# Patient Record
Sex: Male | Born: 1950 | Race: Black or African American | Hispanic: No | Smoking: Former smoker
Health system: Southern US, Community
[De-identification: ages and names within clinical notes are randomized; demographics above are authoritative.]

## PROBLEM LIST (undated history)

## (undated) DIAGNOSIS — E785 Hyperlipidemia, unspecified: Secondary | ICD-10-CM

## (undated) DIAGNOSIS — I714 Abdominal aortic aneurysm, without rupture, unspecified: Secondary | ICD-10-CM

## (undated) DIAGNOSIS — I639 Cerebral infarction, unspecified: Secondary | ICD-10-CM

## (undated) DIAGNOSIS — N289 Disorder of kidney and ureter, unspecified: Secondary | ICD-10-CM

## (undated) DIAGNOSIS — I10 Essential (primary) hypertension: Secondary | ICD-10-CM

## (undated) DIAGNOSIS — M1990 Unspecified osteoarthritis, unspecified site: Secondary | ICD-10-CM

## (undated) DIAGNOSIS — I719 Aortic aneurysm of unspecified site, without rupture: Secondary | ICD-10-CM

## (undated) DIAGNOSIS — G629 Polyneuropathy, unspecified: Secondary | ICD-10-CM

## (undated) DIAGNOSIS — I712 Thoracic aortic aneurysm, without rupture, unspecified: Secondary | ICD-10-CM

## (undated) DIAGNOSIS — J439 Emphysema, unspecified: Secondary | ICD-10-CM

## (undated) DIAGNOSIS — H269 Unspecified cataract: Secondary | ICD-10-CM

## (undated) DIAGNOSIS — K219 Gastro-esophageal reflux disease without esophagitis: Secondary | ICD-10-CM

## (undated) HISTORY — DX: Hyperlipidemia, unspecified: E78.5

## (undated) HISTORY — DX: Thoracic aortic aneurysm, without rupture: I71.2

## (undated) HISTORY — DX: Gastro-esophageal reflux disease without esophagitis: K21.9

## (undated) HISTORY — DX: Thoracic aortic aneurysm, without rupture, unspecified: I71.20

## (undated) HISTORY — DX: Emphysema, unspecified: J43.9

---
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
## 2002-05-26 ENCOUNTER — Emergency Department (HOSPITAL_COMMUNITY): Admission: EM | Admit: 2002-05-26 | Discharge: 2002-05-26 | Payer: Self-pay | Admitting: Emergency Medicine

## 2003-04-15 ENCOUNTER — Inpatient Hospital Stay (HOSPITAL_COMMUNITY): Admission: EM | Admit: 2003-04-15 | Discharge: 2003-04-15 | Payer: Self-pay | Admitting: Emergency Medicine

## 2003-12-04 HISTORY — PX: HERNIA REPAIR: SHX51

## 2004-12-03 HISTORY — PX: ABDOMINAL AORTIC ANEURYSM REPAIR: SUR1152

## 2006-02-03 ENCOUNTER — Inpatient Hospital Stay (HOSPITAL_COMMUNITY): Admission: EM | Admit: 2006-02-03 | Discharge: 2006-02-13 | Payer: Self-pay | Admitting: Emergency Medicine

## 2006-02-03 ENCOUNTER — Ambulatory Visit: Payer: Self-pay | Admitting: Critical Care Medicine

## 2006-02-03 ENCOUNTER — Ambulatory Visit: Payer: Self-pay | Admitting: Cardiology

## 2006-02-03 ENCOUNTER — Encounter (INDEPENDENT_AMBULATORY_CARE_PROVIDER_SITE_OTHER): Payer: Self-pay | Admitting: *Deleted

## 2006-02-05 ENCOUNTER — Encounter: Payer: Self-pay | Admitting: Cardiology

## 2006-02-08 ENCOUNTER — Ambulatory Visit: Payer: Self-pay | Admitting: Internal Medicine

## 2006-02-19 ENCOUNTER — Ambulatory Visit: Payer: Self-pay | Admitting: Hospitalist

## 2006-05-13 ENCOUNTER — Ambulatory Visit: Payer: Self-pay | Admitting: Internal Medicine

## 2006-05-21 ENCOUNTER — Ambulatory Visit: Payer: Self-pay | Admitting: Internal Medicine

## 2006-11-20 DIAGNOSIS — Z9089 Acquired absence of other organs: Secondary | ICD-10-CM | POA: Insufficient documentation

## 2006-11-20 DIAGNOSIS — I498 Other specified cardiac arrhythmias: Secondary | ICD-10-CM

## 2006-11-20 DIAGNOSIS — I10 Essential (primary) hypertension: Secondary | ICD-10-CM | POA: Insufficient documentation

## 2006-11-20 DIAGNOSIS — M109 Gout, unspecified: Secondary | ICD-10-CM

## 2006-11-20 DIAGNOSIS — Z8679 Personal history of other diseases of the circulatory system: Secondary | ICD-10-CM | POA: Insufficient documentation

## 2006-11-20 DIAGNOSIS — Z9889 Other specified postprocedural states: Secondary | ICD-10-CM

## 2007-09-24 ENCOUNTER — Ambulatory Visit: Payer: Self-pay | Admitting: Vascular Surgery

## 2008-01-31 ENCOUNTER — Inpatient Hospital Stay (HOSPITAL_COMMUNITY): Admission: EM | Admit: 2008-01-31 | Discharge: 2008-02-13 | Payer: Self-pay | Admitting: Emergency Medicine

## 2008-02-02 ENCOUNTER — Encounter (INDEPENDENT_AMBULATORY_CARE_PROVIDER_SITE_OTHER): Payer: Self-pay | Admitting: Internal Medicine

## 2008-02-03 ENCOUNTER — Encounter (INDEPENDENT_AMBULATORY_CARE_PROVIDER_SITE_OTHER): Payer: Self-pay | Admitting: Internal Medicine

## 2008-02-05 ENCOUNTER — Ambulatory Visit: Payer: Self-pay | Admitting: Physical Medicine & Rehabilitation

## 2008-02-16 ENCOUNTER — Ambulatory Visit: Payer: Self-pay | Admitting: Family Medicine

## 2008-02-17 ENCOUNTER — Ambulatory Visit: Payer: Self-pay | Admitting: *Deleted

## 2008-02-22 ENCOUNTER — Inpatient Hospital Stay (HOSPITAL_COMMUNITY): Admission: EM | Admit: 2008-02-22 | Discharge: 2008-02-24 | Payer: Self-pay | Admitting: Emergency Medicine

## 2008-03-04 ENCOUNTER — Ambulatory Visit: Payer: Self-pay | Admitting: Internal Medicine

## 2008-03-04 LAB — CONVERTED CEMR LAB
HDL: 52 mg/dL (ref >39)
Total CHOL/HDL Ratio: 2.9
Triglycerides: 80 mg/dL (ref <150)
VLDL: 16 mg/dL (ref 0–40)

## 2008-03-08 ENCOUNTER — Ambulatory Visit: Payer: Self-pay | Admitting: Internal Medicine

## 2008-03-08 LAB — CONVERTED CEMR LAB
AST: 16 units/L (ref 0–37)
Alkaline Phosphatase: 74 units/L (ref 39–117)
CO2: 20 meq/L (ref 19–32)
Creatinine, Ser: 1.65 mg/dL — ABNORMAL HIGH (ref 0.40–1.50)
Total Bilirubin: 0.3 mg/dL (ref 0.3–1.2)
Total Protein: 8.6 g/dL — ABNORMAL HIGH (ref 6.0–8.3)

## 2008-04-14 ENCOUNTER — Encounter: Admission: RE | Admit: 2008-04-14 | Discharge: 2008-06-09 | Payer: Self-pay | Admitting: Family Medicine

## 2008-04-19 ENCOUNTER — Ambulatory Visit: Payer: Self-pay | Admitting: Internal Medicine

## 2008-05-12 ENCOUNTER — Encounter: Payer: Self-pay | Admitting: Family Medicine

## 2008-06-02 ENCOUNTER — Ambulatory Visit: Payer: Self-pay | Admitting: Internal Medicine

## 2008-06-07 ENCOUNTER — Ambulatory Visit: Payer: Self-pay | Admitting: Internal Medicine

## 2008-06-10 ENCOUNTER — Ambulatory Visit: Payer: Self-pay | Admitting: Internal Medicine

## 2008-06-10 ENCOUNTER — Encounter: Payer: Self-pay | Admitting: Family Medicine

## 2008-06-10 LAB — CONVERTED CEMR LAB
Alkaline Phosphatase: 63 units/L (ref 39–117)
BUN: 23 mg/dL (ref 6–23)
CO2: 20 meq/L (ref 19–32)
Calcium: 9.4 mg/dL (ref 8.4–10.5)
Chloride: 105 meq/L (ref 96–112)
Cholesterol: 121 mg/dL (ref 0–200)
Creatinine, Ser: 1.67 mg/dL — ABNORMAL HIGH (ref 0.40–1.50)
Glucose, Bld: 130 mg/dL — ABNORMAL HIGH (ref 70–99)
LDL Cholesterol: 61 mg/dL (ref 0–99)
Total CHOL/HDL Ratio: 3
VLDL: 19 mg/dL (ref 0–40)

## 2008-07-21 ENCOUNTER — Ambulatory Visit: Payer: Self-pay | Admitting: Internal Medicine

## 2008-10-11 ENCOUNTER — Ambulatory Visit: Payer: Self-pay | Admitting: Internal Medicine

## 2008-10-11 ENCOUNTER — Encounter: Payer: Self-pay | Admitting: Family Medicine

## 2008-10-11 LAB — CONVERTED CEMR LAB
AST: 53 units/L — ABNORMAL HIGH (ref 0–37)
Albumin: 4.7 g/dL (ref 3.5–5.2)
Alkaline Phosphatase: 75 units/L (ref 39–117)
Amphetamine Screen, Ur: NEGATIVE
Creatinine, Ser: 1.47 mg/dL (ref 0.40–1.50)
HDL: 49 mg/dL (ref >39)
LDL Cholesterol: 71 mg/dL (ref 0–99)
Methadone: NEGATIVE
Opiate Screen, Urine: NEGATIVE
Phencyclidine (PCP): NEGATIVE
Potassium: 4.6 meq/L (ref 3.5–5.3)
Sodium: 138 meq/L (ref 135–145)
Total Protein: 8.1 g/dL (ref 6.0–8.3)
Triglycerides: 76 mg/dL (ref <150)

## 2008-10-20 ENCOUNTER — Ambulatory Visit: Payer: Self-pay | Admitting: Internal Medicine

## 2009-02-13 ENCOUNTER — Inpatient Hospital Stay (HOSPITAL_COMMUNITY): Admission: EM | Admit: 2009-02-13 | Discharge: 2009-02-17 | Payer: Self-pay | Admitting: Emergency Medicine

## 2009-03-30 ENCOUNTER — Ambulatory Visit: Payer: Self-pay | Admitting: Internal Medicine

## 2009-04-25 ENCOUNTER — Ambulatory Visit: Payer: Self-pay | Admitting: Family Medicine

## 2009-05-30 ENCOUNTER — Ambulatory Visit: Payer: Self-pay | Admitting: Internal Medicine

## 2009-05-30 ENCOUNTER — Encounter: Payer: Self-pay | Admitting: Family Medicine

## 2009-05-30 LAB — CONVERTED CEMR LAB
ALT: 17 units/L (ref 0–53)
AST: 18 units/L (ref 0–37)
Albumin: 4.4 g/dL (ref 3.5–5.2)
Alkaline Phosphatase: 62 units/L (ref 39–117)
Basophils Relative: 0 % (ref 0–1)
Creatinine, Ser: 1.37 mg/dL (ref 0.40–1.50)
Eosinophils Absolute: 0.2 10*3/uL (ref 0.0–0.7)
Hemoglobin: 13.4 g/dL (ref 13.0–17.0)
LDL Cholesterol: 94 mg/dL (ref 0–99)
Lymphocytes Relative: 24 % (ref 12–46)
Lymphs Abs: 2.5 10*3/uL (ref 0.7–4.0)
Monocytes Absolute: 0.6 10*3/uL (ref 0.1–1.0)
Neutro Abs: 6.9 10*3/uL (ref 1.7–7.7)
Neutrophils Relative %: 67 % (ref 43–77)
RBC: 4.46 M/uL (ref 4.22–5.81)
RDW: 16.4 % — ABNORMAL HIGH (ref 11.5–15.5)
TSH: 1.075 microintl units/mL (ref 0.350–4.500)
Total Bilirubin: 0.3 mg/dL (ref 0.3–1.2)
Total Protein: 7.4 g/dL (ref 6.0–8.3)
Triglycerides: 87 mg/dL (ref <150)

## 2009-06-08 ENCOUNTER — Ambulatory Visit: Payer: Self-pay | Admitting: Internal Medicine

## 2009-08-09 ENCOUNTER — Ambulatory Visit: Payer: Self-pay | Admitting: Internal Medicine

## 2009-09-07 ENCOUNTER — Ambulatory Visit: Payer: Self-pay | Admitting: Internal Medicine

## 2009-11-20 ENCOUNTER — Emergency Department (HOSPITAL_COMMUNITY): Admission: EM | Admit: 2009-11-20 | Discharge: 2009-11-20 | Payer: Self-pay | Admitting: Emergency Medicine

## 2009-11-29 ENCOUNTER — Ambulatory Visit: Payer: Self-pay | Admitting: Family Medicine

## 2010-01-11 ENCOUNTER — Ambulatory Visit: Payer: Self-pay | Admitting: Internal Medicine

## 2010-01-17 ENCOUNTER — Ambulatory Visit: Payer: Self-pay | Admitting: Internal Medicine

## 2010-01-17 LAB — CONVERTED CEMR LAB
AST: 13 units/L (ref 0–37)
CO2: 21 meq/L (ref 19–32)
CRP: 4.6 mg/dL — ABNORMAL HIGH (ref <0.6)
Calcium: 9.3 mg/dL (ref 8.4–10.5)
Chloride: 104 meq/L (ref 96–112)
Glucose, Bld: 153 mg/dL — ABNORMAL HIGH (ref 70–99)
Sodium: 138 meq/L (ref 135–145)
Total CHOL/HDL Ratio: 2.8
Total Protein: 7.6 g/dL (ref 6.0–8.3)
Uric Acid, Serum: 11.2 mg/dL — ABNORMAL HIGH (ref 4.0–7.8)

## 2010-01-18 ENCOUNTER — Ambulatory Visit: Payer: Self-pay | Admitting: Family Medicine

## 2010-02-10 ENCOUNTER — Emergency Department (HOSPITAL_COMMUNITY): Admission: EM | Admit: 2010-02-10 | Discharge: 2010-02-10 | Payer: Self-pay | Admitting: Emergency Medicine

## 2010-02-22 ENCOUNTER — Ambulatory Visit: Payer: Self-pay | Admitting: Internal Medicine

## 2010-02-22 LAB — CONVERTED CEMR LAB
Calcium: 9.7 mg/dL (ref 8.4–10.5)
Chloride: 106 meq/L (ref 96–112)
Creatinine, Ser: 1.59 mg/dL — ABNORMAL HIGH (ref 0.40–1.50)
Glucose, Bld: 94 mg/dL (ref 70–99)

## 2010-03-22 ENCOUNTER — Ambulatory Visit: Payer: Self-pay | Admitting: Internal Medicine

## 2010-05-03 ENCOUNTER — Ambulatory Visit: Payer: Self-pay | Admitting: Internal Medicine

## 2010-05-03 LAB — CONVERTED CEMR LAB
CO2: 21 meq/L (ref 19–32)
Chloride: 110 meq/L (ref 96–112)
Magnesium: 2 mg/dL (ref 1.5–2.5)
Potassium: 4.5 meq/L (ref 3.5–5.3)
Sodium: 144 meq/L (ref 135–145)

## 2010-05-10 ENCOUNTER — Ambulatory Visit: Payer: Self-pay | Admitting: Internal Medicine

## 2010-06-07 ENCOUNTER — Emergency Department (HOSPITAL_COMMUNITY): Admission: EM | Admit: 2010-06-07 | Discharge: 2010-06-07 | Payer: Self-pay | Admitting: Emergency Medicine

## 2010-07-03 ENCOUNTER — Ambulatory Visit: Payer: Self-pay | Admitting: Internal Medicine

## 2010-07-03 LAB — CONVERTED CEMR LAB
BUN: 23 mg/dL (ref 6–23)
CO2: 21 meq/L (ref 19–32)
Chloride: 107 meq/L (ref 96–112)
Creatinine, Ser: 1.31 mg/dL (ref 0.40–1.50)
Potassium: 4.3 meq/L (ref 3.5–5.3)

## 2010-07-17 ENCOUNTER — Ambulatory Visit: Payer: Self-pay | Admitting: Internal Medicine

## 2010-07-17 LAB — CONVERTED CEMR LAB
BUN: 16 mg/dL (ref 6–23)
Calcium: 9.6 mg/dL (ref 8.4–10.5)
Chloride: 107 meq/L (ref 96–112)
Potassium: 4.2 meq/L (ref 3.5–5.3)
Sodium: 141 meq/L (ref 135–145)

## 2011-02-21 ENCOUNTER — Other Ambulatory Visit (HOSPITAL_COMMUNITY): Payer: Self-pay | Admitting: Family Medicine

## 2011-02-21 ENCOUNTER — Ambulatory Visit (HOSPITAL_COMMUNITY)
Admission: RE | Admit: 2011-02-21 | Discharge: 2011-02-21 | Disposition: A | Discharge status: Home / Self Care | Payer: Medicare Other | Source: Ambulatory Visit | Attending: Family Medicine | Admitting: Family Medicine

## 2011-02-21 ENCOUNTER — Inpatient Hospital Stay (HOSPITAL_COMMUNITY)
Admission: RE | Admit: 2011-02-21 | Discharge: 2011-02-21 | Disposition: A | Discharge status: Home / Self Care | Payer: Medicare Other | Source: Ambulatory Visit | Attending: Family Medicine | Admitting: Family Medicine

## 2011-02-21 DIAGNOSIS — R0602 Shortness of breath: Secondary | ICD-10-CM | POA: Insufficient documentation

## 2011-02-21 DIAGNOSIS — Z8673 Personal history of transient ischemic attack (TIA), and cerebral infarction without residual deficits: Secondary | ICD-10-CM | POA: Insufficient documentation

## 2011-02-21 DIAGNOSIS — R52 Pain, unspecified: Secondary | ICD-10-CM

## 2011-02-21 DIAGNOSIS — F17200 Nicotine dependence, unspecified, uncomplicated: Secondary | ICD-10-CM | POA: Insufficient documentation

## 2011-02-21 DIAGNOSIS — R079 Chest pain, unspecified: Secondary | ICD-10-CM | POA: Insufficient documentation

## 2011-02-21 DIAGNOSIS — R059 Cough, unspecified: Secondary | ICD-10-CM | POA: Insufficient documentation

## 2011-02-21 DIAGNOSIS — R05 Cough: Secondary | ICD-10-CM | POA: Insufficient documentation

## 2011-02-25 LAB — BASIC METABOLIC PANEL
CO2: 22 mEq/L (ref 19–32)
Calcium: 9.2 mg/dL (ref 8.4–10.5)
Chloride: 105 mEq/L (ref 96–112)
Creatinine, Ser: 1.42 mg/dL (ref 0.4–1.5)
Glucose, Bld: 191 mg/dL — ABNORMAL HIGH (ref 70–99)
Sodium: 137 mEq/L (ref 135–145)

## 2011-02-25 LAB — ANAEROBIC CULTURE

## 2011-02-25 LAB — BASIC METABOLIC PANEL WITH GFR
BUN: 18 mg/dL (ref 6–23)
GFR calc Af Amer: >60 mL/min (ref >60)
GFR calc non Af Amer: 51 mL/min — ABNORMAL LOW (ref >60)
Potassium: 3.9 meq/L (ref 3.5–5.1)

## 2011-02-25 LAB — CBC
HCT: 44.1 % (ref 39.0–52.0)
Hemoglobin: 14.8 g/dL (ref 13.0–17.0)
MCHC: 33.6 g/dL (ref 30.0–36.0)
MCV: 92.1 fL (ref 78.0–100.0)
Platelets: 172 K/uL (ref 150–400)
RBC: 4.78 MIL/uL (ref 4.22–5.81)
RDW: 16 % — ABNORMAL HIGH (ref 11.5–15.5)
WBC: 14.2 K/uL — ABNORMAL HIGH (ref 4.0–10.5)

## 2011-02-25 LAB — GRAM STAIN

## 2011-02-25 LAB — SEDIMENTATION RATE: Sed Rate: 75 mm/h — ABNORMAL HIGH (ref 0–16)

## 2011-02-25 LAB — DIFFERENTIAL
Basophils Absolute: 0 K/uL (ref 0.0–0.1)
Basophils Relative: 0 % (ref 0–1)
Eosinophils Absolute: 0.1 10*3/uL (ref 0.0–0.7)
Eosinophils Relative: 1 % (ref 0–5)
Lymphocytes Relative: 13 % (ref 12–46)
Lymphs Abs: 1.8 10*3/uL (ref 0.7–4.0)
Monocytes Absolute: 1.6 10*3/uL — ABNORMAL HIGH (ref 0.1–1.0)
Monocytes Relative: 11 % (ref 3–12)
Neutro Abs: 10.6 K/uL — ABNORMAL HIGH (ref 1.7–7.7)
Neutrophils Relative %: 75 % (ref 43–77)

## 2011-02-25 LAB — SYNOVIAL CELL COUNT + DIFF, W/ CRYSTALS
Eosinophils-Synovial: 0 % (ref 0–1)
WBC, Synovial: 22365 /mm3 — ABNORMAL HIGH (ref 0–200)

## 2011-02-25 LAB — BODY FLUID CULTURE: Culture: NO GROWTH

## 2011-02-25 LAB — GLUCOSE, SYNOVIAL FLUID: Glucose, Synovial Fluid: 150 mg/dL

## 2011-02-25 LAB — C-REACTIVE PROTEIN: CRP: 27.2 mg/dL — ABNORMAL HIGH (ref <0.6)

## 2011-02-25 LAB — PROTEIN, BODY FLUID

## 2011-03-02 ENCOUNTER — Other Ambulatory Visit (HOSPITAL_COMMUNITY): Payer: Self-pay | Admitting: Family Medicine

## 2011-03-02 DIAGNOSIS — R05 Cough: Secondary | ICD-10-CM

## 2011-03-02 DIAGNOSIS — R059 Cough, unspecified: Secondary | ICD-10-CM

## 2011-03-02 DIAGNOSIS — M549 Dorsalgia, unspecified: Secondary | ICD-10-CM

## 2011-03-05 LAB — URINALYSIS, ROUTINE W REFLEX MICROSCOPIC
Bilirubin Urine: NEGATIVE
Glucose, UA: NEGATIVE mg/dL
Hgb urine dipstick: NEGATIVE
Ketones, ur: NEGATIVE mg/dL
Leukocytes, UA: NEGATIVE
Protein, ur: 30 mg/dL — AB
pH: 6 (ref 5.0–8.0)

## 2011-03-05 LAB — URINE MICROSCOPIC-ADD ON

## 2011-03-07 ENCOUNTER — Ambulatory Visit (HOSPITAL_COMMUNITY)
Admission: RE | Admit: 2011-03-07 | Discharge: 2011-03-07 | Disposition: A | Discharge status: Home / Self Care | Payer: Medicare Other | Source: Ambulatory Visit | Attending: Family Medicine | Admitting: Family Medicine

## 2011-03-07 ENCOUNTER — Encounter (HOSPITAL_COMMUNITY): Payer: Self-pay

## 2011-03-07 DIAGNOSIS — M549 Dorsalgia, unspecified: Secondary | ICD-10-CM

## 2011-03-07 DIAGNOSIS — I712 Thoracic aortic aneurysm, without rupture, unspecified: Secondary | ICD-10-CM | POA: Insufficient documentation

## 2011-03-07 DIAGNOSIS — J438 Other emphysema: Secondary | ICD-10-CM | POA: Insufficient documentation

## 2011-03-07 DIAGNOSIS — R05 Cough: Secondary | ICD-10-CM

## 2011-03-07 HISTORY — DX: Essential (primary) hypertension: I10

## 2011-03-07 MED ORDER — IOHEXOL 300 MG/ML  SOLN
100.0000 mL | Freq: Once | INTRAMUSCULAR | Status: AC | PRN
  Administered 2011-03-07: 100 mL via INTRAVENOUS

## 2011-03-12 ENCOUNTER — Other Ambulatory Visit (INDEPENDENT_AMBULATORY_CARE_PROVIDER_SITE_OTHER): Payer: Medicare Other | Admitting: Internal Medicine

## 2011-03-12 DIAGNOSIS — E119 Type 2 diabetes mellitus without complications: Secondary | ICD-10-CM

## 2011-03-12 DIAGNOSIS — E669 Obesity, unspecified: Secondary | ICD-10-CM

## 2011-03-12 DIAGNOSIS — E1169 Type 2 diabetes mellitus with other specified complication: Secondary | ICD-10-CM

## 2011-03-15 ENCOUNTER — Other Ambulatory Visit (HOSPITAL_COMMUNITY): Payer: Self-pay | Admitting: Family Medicine

## 2011-03-15 DIAGNOSIS — I712 Thoracic aortic aneurysm, without rupture: Secondary | ICD-10-CM

## 2011-03-15 LAB — GLUCOSE, CAPILLARY
Glucose-Capillary: 109 mg/dL — ABNORMAL HIGH (ref 70–99)
Glucose-Capillary: 113 mg/dL — ABNORMAL HIGH (ref 70–99)
Glucose-Capillary: 124 mg/dL — ABNORMAL HIGH (ref 70–99)
Glucose-Capillary: 199 mg/dL — ABNORMAL HIGH (ref 70–99)
Glucose-Capillary: 201 mg/dL — ABNORMAL HIGH (ref 70–99)
Glucose-Capillary: 209 mg/dL — ABNORMAL HIGH (ref 70–99)
Glucose-Capillary: 254 mg/dL — ABNORMAL HIGH (ref 70–99)
Glucose-Capillary: 270 mg/dL — ABNORMAL HIGH (ref 70–99)
Glucose-Capillary: 271 mg/dL — ABNORMAL HIGH (ref 70–99)
Glucose-Capillary: 277 mg/dL — ABNORMAL HIGH (ref 70–99)
Glucose-Capillary: 125 mg/dL — ABNORMAL HIGH (ref 70–99)
Glucose-Capillary: 125 mg/dL — ABNORMAL HIGH (ref 70–99)
Glucose-Capillary: 149 mg/dL — ABNORMAL HIGH (ref 70–99)
Glucose-Capillary: 163 mg/dL — ABNORMAL HIGH (ref 70–99)
Glucose-Capillary: 166 mg/dL — ABNORMAL HIGH (ref 70–99)
Glucose-Capillary: 204 mg/dL — ABNORMAL HIGH (ref 70–99)
Glucose-Capillary: 281 mg/dL — ABNORMAL HIGH (ref 70–99)
Glucose-Capillary: 438 mg/dL — ABNORMAL HIGH (ref 70–99)
Glucose-Capillary: 552 mg/dL (ref 70–99)
Glucose-Capillary: >600 mg/dL (ref 70–99)
Glucose-Capillary: >600 mg/dL (ref 70–99)

## 2011-03-15 LAB — BASIC METABOLIC PANEL
BUN: 22 mg/dL (ref 6–23)
BUN: 48 mg/dL — ABNORMAL HIGH (ref 6–23)
BUN: 50 mg/dL — ABNORMAL HIGH (ref 6–23)
CO2: 22 mEq/L (ref 19–32)
CO2: 25 mEq/L (ref 19–32)
CO2: 27 mEq/L (ref 19–32)
Calcium: 8.9 mg/dL (ref 8.4–10.5)
Calcium: 10.5 mg/dL (ref 8.4–10.5)
Chloride: 109 mEq/L (ref 96–112)
Chloride: 113 mEq/L — ABNORMAL HIGH (ref 96–112)
Chloride: 118 mEq/L — ABNORMAL HIGH (ref 96–112)
Chloride: 121 mEq/L — ABNORMAL HIGH (ref 96–112)
Creatinine, Ser: 1.52 mg/dL — ABNORMAL HIGH (ref 0.4–1.5)
Creatinine, Ser: 1.96 mg/dL — ABNORMAL HIGH (ref 0.4–1.5)
Creatinine, Ser: 2.56 mg/dL — ABNORMAL HIGH (ref 0.4–1.5)
Creatinine, Ser: 2.7 mg/dL — ABNORMAL HIGH (ref 0.4–1.5)
GFR calc Af Amer: 43 mL/min — ABNORMAL LOW (ref >60)
GFR calc Af Amer: 29 mL/min — ABNORMAL LOW (ref >60)
GFR calc non Af Amer: 47 mL/min — ABNORMAL LOW (ref >60)
GFR calc non Af Amer: 24 mL/min — ABNORMAL LOW (ref >60)
Glucose, Bld: 101 mg/dL — ABNORMAL HIGH (ref 70–99)
Glucose, Bld: 234 mg/dL — ABNORMAL HIGH (ref 70–99)
Glucose, Bld: 161 mg/dL — ABNORMAL HIGH (ref 70–99)
Potassium: 3.5 mEq/L (ref 3.5–5.1)
Potassium: 3.6 mEq/L (ref 3.5–5.1)
Sodium: 141 mEq/L (ref 135–145)
Sodium: 144 mEq/L (ref 135–145)
Sodium: 154 mEq/L — ABNORMAL HIGH (ref 135–145)

## 2011-03-15 LAB — CBC
HCT: 39.6 % (ref 39.0–52.0)
Hemoglobin: 13.6 g/dL (ref 13.0–17.0)
Hemoglobin: 14.6 g/dL (ref 13.0–17.0)
Hemoglobin: 15.9 g/dL (ref 13.0–17.0)
MCHC: 34.4 g/dL (ref 30.0–36.0)
MCHC: 33.4 g/dL (ref 30.0–36.0)
MCV: 90.3 fL (ref 78.0–100.0)
MCV: 91.5 fL (ref 78.0–100.0)
MCV: 92 fL (ref 78.0–100.0)
Platelets: 79 10*3/uL — ABNORMAL LOW (ref 150–400)
Platelets: 132 10*3/uL — ABNORMAL LOW (ref 150–400)
RBC: 4.55 MIL/uL (ref 4.22–5.81)
RDW: 14.7 % (ref 11.5–15.5)
RDW: 15.2 % (ref 11.5–15.5)
RDW: 15.2 % (ref 11.5–15.5)
WBC: 16.3 10*3/uL — ABNORMAL HIGH (ref 4.0–10.5)
WBC: 9.8 10*3/uL (ref 4.0–10.5)

## 2011-03-15 LAB — DIFFERENTIAL
Basophils Absolute: 0 10*3/uL (ref 0.0–0.1)
Basophils Relative: 0 % (ref 0–1)
Eosinophils Absolute: 0.4 10*3/uL (ref 0.0–0.7)
Eosinophils Absolute: 0.6 10*3/uL (ref 0.0–0.7)
Eosinophils Absolute: 0 10*3/uL (ref 0.0–0.7)
Eosinophils Relative: 3 % (ref 0–5)
Lymphs Abs: 5.3 10*3/uL — ABNORMAL HIGH (ref 0.7–4.0)
Lymphs Abs: 1.6 10*3/uL (ref 0.7–4.0)
Monocytes Absolute: 0.8 10*3/uL (ref 0.1–1.0)
Monocytes Absolute: 1.1 10*3/uL — ABNORMAL HIGH (ref 0.1–1.0)
Monocytes Absolute: 0.8 10*3/uL (ref 0.1–1.0)
Monocytes Relative: 7 % (ref 3–12)
Monocytes Relative: 6 % (ref 3–12)
Neutro Abs: 9.3 10*3/uL — ABNORMAL HIGH (ref 1.7–7.7)
Neutrophils Relative %: 57 % (ref 43–77)
Neutrophils Relative %: 81 % — ABNORMAL HIGH (ref 43–77)

## 2011-03-15 LAB — RAPID URINE DRUG SCREEN, HOSP PERFORMED
Cocaine: NOT DETECTED
Tetrahydrocannabinol: NOT DETECTED

## 2011-03-15 LAB — COMPREHENSIVE METABOLIC PANEL
ALT: 30 U/L (ref 0–53)
Albumin: 4.6 g/dL (ref 3.5–5.2)
Alkaline Phosphatase: 113 U/L (ref 39–117)
Calcium: 10.4 mg/dL (ref 8.4–10.5)
GFR calc Af Amer: 25 mL/min — ABNORMAL LOW (ref >60)
Glucose, Bld: 1083 mg/dL (ref 70–99)
Potassium: 6.1 mEq/L — ABNORMAL HIGH (ref 3.5–5.1)
Sodium: 137 mEq/L (ref 135–145)
Total Protein: 9 g/dL — ABNORMAL HIGH (ref 6.0–8.3)

## 2011-03-15 LAB — MAGNESIUM
Magnesium: 2.7 mg/dL — ABNORMAL HIGH (ref 1.5–2.5)
Magnesium: 2.9 mg/dL — ABNORMAL HIGH (ref 1.5–2.5)
Magnesium: 3.5 mg/dL — ABNORMAL HIGH (ref 1.5–2.5)

## 2011-03-15 LAB — URINE MICROSCOPIC-ADD ON

## 2011-03-15 LAB — LIPID PANEL
Triglycerides: 152 mg/dL — ABNORMAL HIGH (ref <150)
VLDL: 30 mg/dL (ref 0–40)

## 2011-03-15 LAB — URINE CULTURE: Colony Count: 50000

## 2011-03-15 LAB — PHOSPHORUS: Phosphorus: 2.9 mg/dL (ref 2.3–4.6)

## 2011-03-15 LAB — URINALYSIS, ROUTINE W REFLEX MICROSCOPIC
Glucose, UA: >1000 mg/dL — AB
Ketones, ur: 15 mg/dL — AB
Leukocytes, UA: NEGATIVE
Nitrite: NEGATIVE
Protein, ur: NEGATIVE mg/dL

## 2011-03-19 ENCOUNTER — Encounter: Payer: Medicare Other | Admitting: Cardiothoracic Surgery

## 2011-03-19 ENCOUNTER — Other Ambulatory Visit (HOSPITAL_COMMUNITY): Payer: Medicare Other

## 2011-03-20 ENCOUNTER — Encounter (INDEPENDENT_AMBULATORY_CARE_PROVIDER_SITE_OTHER): Payer: Medicare Other | Admitting: Cardiothoracic Surgery

## 2011-03-20 DIAGNOSIS — I712 Thoracic aortic aneurysm, without rupture: Secondary | ICD-10-CM

## 2011-03-21 NOTE — Consult Note (Signed)
NEW PATIENT CONSULTATION  [name], [name] DOB:  03/17/1951                                        March 20, 2011 CHART #:  [id number]  REASON FOR CONSULTATION:  A 5.1-cm dilatation of the proximal descending thoracic aorta.  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  The patient has been worked up by Dr. Clelia Croft at [facility].  He initially presented with symptoms of bronchitis and some mid back pain with coughing.  His sputum was slightly dark, but no hemoptysis was noted.  The patient was treated by Dr. Clelia Croft with a course of steroids and oral antibiotics with improved symptoms.  Her exam indicated some tenderness along the paraspinal muscles.  A followup CT scan was performed in late March which showed bullous emphysema and atherosclerotic calcification of the thoracic aorta with a 5.1-cm diameter of the descending thoracic aorta past the left subclavian artery.  The ascending aorta measures 4.0-cm.  Because of the CT scan was done with a rule out pulmonary embolus protocol, it was difficult to say if there is actually a penetrating ulcer in the descending thoracic aorta or just atherosclerotic disease with calcification.  The patient did not have evidence of pulmonary embolus.  Because of the abnormality on CT scan, a thoracic surgical evaluation was requested.  PAST MEDICAL HISTORY: 1. Hypertension. 2. Smoking one pack per day. 3. Diabetes mellitus. 4. Status post left mid brain stroke in 2009, with subsequent diplopia     and gait disorder. 5. 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 Gretta Began. 6. Chronic renal insufficiency with a creatinine baseline of 2.0. 7. Gout.  CURRENT MEDICATIONS: 1. Norvasc 5 mg daily. 2. Lisinopril 10 mg daily. 3. Labetalol 200 mg b.i.d. 4. Aspirin 81 mg daily. 5. Glipizide 10 mg b.i.d.  ALLERGIES:  None.  SOCIAL HISTORY:  The patient is retired from working in [employer].  He lives with his sister [name] and receives health care from [facility].  He gets his medications from the [facility].  He smokes a pack of cigarettes a day and drinks alcohol on the weekends only.  He does not drive.  FAMILY HISTORY:  Positive for diabetes and hypertension.  Negative for aortic aneurysm disease.  REVIEW OF SYSTEMS:  CONSTITUTIONAL:  The patient overall has a fairly sedentary lifestyle.  He denies recent fever or night sweats. ENT REVIEW:  Positive for a partial plate in the mandible, otherwise very few remaining teeth in the mandible.  No teeth in the maxilla.  He denies difficulty swallowing or symptoms of aspiration.  He denies thoracic trauma, rib fracture or pneumothorax. CARDIAC REVIEW:  Negative for coronary artery disease, arrhythmia or murmur.  His last echo was in 2009, which showed normal EF with mild AI and mild dilatation of the aortic root. GI REVIEW:  Positive for some evidence of hematochezia in the past, felt to be due to internal hemorrhoids.  A colonoscopy was recommended, but the patient apparently refused.  Urologic review  is positive for his chronic renal insufficiency and his creatinine in the past was gotten to the size of 3.0. GU REVIEW:  Positive for his abdominal aortic aneurysm performed in 2007 with reimplantation of the inferior mesenteric artery into the graft. NEUROLOGIC REVIEW:  Positive for his stroke with resultant gait disorder and some diplopia. HEMATOLOGIC Review:  Negative for bleeding disorder.  PHYSICAL EXAMINATION:  Vital Signs:  The patient is 6 feet 3 inches,  his weight is stable at approximately 200 pounds, blood pressure 168/100, pulse 70, respirations 20 and saturation 97%.  General:  He is chronically ill and somewhat unkempt in appearance.  He smells strongly of tobacco smoke.  HEENT:  His pupils are reactive.  Dentition poor. Otherwise, normocephalic.  Neck:  Without JVD, mass or bruit. Lymphatics:  No palpable supraclavicular or cervical adenopathy.  Breath sounds are diminished bilaterally without wheeze.  Cardiac:  Regular rhythm without murmur or gallop.  Abdomen:  Soft with a well-healed midline surgical scar.  There is no mass palpable.  Extremities: Positive for clubbing and nicotine staining of the nails.  Peripheral pulses are 2+ in the radial and femoral, nonpalpable in the pedal regions.  He has some swelling of his left great toe and tenderness consistent with gout.  IMPRESSION AND PLAN:  This patient has multiple medical problems as well as probable generalized atherosclerotic vascular disease.  The previous abdominal aortic aneurysm required a graft and reconstruction.  His thoracic aorta has moderate dilatation of the proximal descending portion.  The root appears to be only about 4-cm as of the arch.  I told the patient that I felt the best treatment at this time was close followup of the thoracic aorta with serial scans (without contrast) and smoking cessation.  I recommended adding Zocor to help maintain blood vessel integrity and to continue good blood pressure control.  The location of the thoracic aorta disease at this point will be difficult to treat percutaneously with a stent graft or with an open operation and arch replacement using circulatory arrest.  Both of these would have significant risks, and the risks I believe with surgical therapy at this point will be higher than risks of dissection or rupture.  For that reason, I recommend followup with serial scans, although the patient will return in  approximately 3 months with a CT scan without contrast.  Kerin Perna, M.D. Electronically Signed  PV/MEDQ  D:  03/20/2011  T:  03/21/2011  Job:  [id number]  cc:   Norberto Sorenson, MD

## 2011-04-17 NOTE — Discharge Summary (Signed)
[name], CANEPA           ACCOUNT NO.:  [id number]   MEDICAL RECORD NO.:  [id number]          PATIENT TYPE:  INP   LOCATION:  6733                         FACILITY:  [facility]   PHYSICIAN:  Peggye Pitt, M.D. DATE OF BIRTH:  [birth year]   DATE OF ADMISSION:  02/13/2009  DATE OF DISCHARGE:  02/17/2009                               DISCHARGE SUMMARY   PRIMARY CARE PHYSICIAN:  Sharin Grave, MD   DISCHARGE DIAGNOSES:  1. Hyperosmolar nonketotic coma, resolved.  2. Type 2 diabetes mellitus, new diagnosis.  3. Acute on chronic renal insufficiency.  4. Leukocytosis, resolved.  5. Hypertension.  6. History of cerebrovascular accident.   DISCHARGE MEDICATIONS:  1. Norvasc 5 mg daily.  2. Lisinopril 10 mg daily.  3. Labetalol 200 mg twice daily.  4. Aspirin 81 mg daily.  5. Glipizide 10 mg twice daily.  6. Lantus 35 units subcu at bedtime.   DISPOSITION AND FOLLOWUP:  [name] was discharged home in stable  condition up.  He is instructed to call his primary care physician  within a week to obtain an appointment within the next couple of weeks.  From the time of followup, I would recommended checking a BMET to follow  his renal function and also following his CBG trend to further adjust  his Lantus.   CONSULTATIONS THIS HOSPITALIZATION:  None.   IMAGES AND PROCEDURES:  A chest x-ray on February 15, 2009 showed  cardiomegaly with no evidence for CHF.   HISTORY AND PHYSICAL:  For full details, please refer to dictation by  Dr. Lovell Sheehan on February 13, 2009 but in brief, [name] is a very  pleasant 60 year old African American man with past medical history of  hypertension and a prior CVA who was brought in to the hospital  secondary to increased urination.  While in the ED, his sugar was found  to be greater than 1000 and for that reason he was brought into the  hospital for further evaluation and management.   HOSPITAL COURSE:  1. Hyperosmolar nonketotic coma.   Immediately started on IV fluids and      an IV insulin drip has now been transitioned over to subcutaneous      lantus once daily.  He has also been started on glipizide at      maximum dose 10 mg daily.  Given his creatinine clearance, I am      unable to start metformin at this time given the risk for lactic      acidosis.  He has been instructed on checking his blood sugars at      least 2-3 times a day and bringing his log book into his follow up      with his primary care physician for further adjustment of his      Lantus.  As for his hypertension, his blood pressure has been well      controlled while in the hospital.  Of note, given his new diagnosis      of diabetes, I have discontinue his clonidine and started him on an      ACE inhibitor for renal  protection.  The  ACE inhibitor is      lisinopril 10 mg daily.  2. Acute on chronic renal insufficiency.  Upon admission, his initial      creatinine was 3.15 and it has decreased to 1.521 on day of      discharge.  Acute renal insufficiency was likely secondary to      severe dehydration in lieu of increased blood sugars.  His      creatinine has been stable at the 1.5 range for the past 2 days and      I wonder if he may have some degree of chronic renal insufficiency      secondary to his diabetes and hypertension.  For this reason, I      would recommend a followup visit with PCP for recheck of his renal      function to follow.  3. Leukocytosis, which has resolved to 9.8 on day of discharge.  We      have not found a source of his leukocytosis.  4. Rest of chronic medical issues were not a problem this      hospitalization.   DISCHARGE VITAL SIGNS:  Blood pressure 116/74, heart rate 65,  respirations 16, O2 sats 95% on room air with a temperature of 98.0.   DISCHARGE LABORATORY DATA:  Sodium 139, potassium 3.8, chloride 106,  bicarb 23, BUN 22, creatinine 1.52 with a glucose of 234.  WBCs 9.3,  hemoglobin 13.5, and a  platelet count of 179.      Peggye Pitt, M.D.  Electronically Signed     EH/MEDQ  D:  02/17/2009  T:  02/17/2009  Job:  [id number]   cc:   Sharin Grave, MD

## 2011-04-17 NOTE — H&P (Signed)
NAMEBUEL, MOLDER NO.:  [id number]   MEDICAL RECORD NO.:  [id number]          PATIENT TYPE:  INP   LOCATION:  1826                         FACILITY:  [facility]   PHYSICIAN:  Lonia Blood, M.D.DATE OF BIRTH:  1951/01/06   DATE OF ADMISSION:  02/22/2008  DATE OF DISCHARGE:                              HISTORY & PHYSICAL   PRIMARY CARE PHYSICIAN:  [facility].   CHIEF COMPLAINT:  Bright red blood per rectum.   PRESENT ILLNESS:  [name] is a 60 year old gentleman who  is known to our service.  He was discharged from the hospital in mid  March of 2009 after suffering a left brain stroke resulting in left  ptosis, diplopia, and gait instability due to right lower extremity  weakness.  Aspirin therapy was initiated at that time at 325 mg a day  from a baseline of no aspirin.  The patient has been getting along fine  and was in his usual state of health until this morning.  At  approximately 5 in the morning, he got up to use the bathroom.  He felt  the urge to defecate.  He had a very urgent defecation.  He noted bright  red blood in the toilet and on the toilet paper.  Then he noted an  approximate thumb-size blood clot in his underwear.  This was more  maroon in color.  The patient does specifically state, however, that he  had bright red blood in his the toilet and on the toilet paper.  He  reports that he had a similar episode back in March of 2007 when he had  his AAA repair.  These are the only two episodes of this that he has  experienced himself.  There has been no nausea or vomiting.  There had  been no fevers and chills.  No abdominal pain.  No chest pain or  shortness of breath.  At the present time, the patient feels that he is  neurologically stable.   REVIEW OF SYSTEMS:  A comprehensive review of systems is unremarkable  with the exception of the positive elements noted in the history of  present illness above.   PAST MEDICAL  HISTORY:  1. Hypertension.  2. Left brain CVA with ptosis, diplopia, and gait instability as a      result - March of 2009.  3. Status post abdominal aortic aneurysm repair March in 2007.  4. Tobacco abuse in the amount of 1-1/2 packs per day x30 years.  5. Hyperlipidemia.  6. Impaired glucose tolerance.  7. Status post appendectomy.  8. History of right retinal detachment surgery.  9. History of right inguinal hernia repair per old records.   MEDICATIONS:  1. Norvasc 10 mg daily.  2. Clonidine 0.1 mg p.o. t.i.d.  3. Zocor 40 mg p.o. daily.  4. Labetalol 400 mg p.o. b.i.d.  5. Aspirin 325 mg p.o. daily.   ALLERGIES:  NO KNOWN DRUG ALLERGIES.   FAMILY HISTORY:  Noncontributory.   SOCIAL HISTORY:  The patient occasionally partakes in alcohol.  He  discontinued smoking per his report at his earlier hospitalization this  month.  He lives in [place] along with a brother and a sister.   LABORATORY DATA:  The patient is guaiac-positive with bright red blood.  Hemoglobin is 11.2 and was 13.9 on February 02, 2008, white count is  elevated 11.9, platelet count is normal, MCV is normal.  Coags are  normal.  BMET is unremarkable with the exception of elevated BUN at 35  and elevated creatinine at 1.7 which is decreased from 1.86 on February 13, 2008.  Serum glucose is elevated at 125.  LFTs are normal.  Total bili  0.3, albumin is 3.4.   PHYSICAL EXAMINATION:  VITAL SIGNS:  Temperature 97.2, blood pressure  126/84, heart rate 68, respiratory 20, O2 saturation 96% on room air.  GENERAL:  Well-developed, well-nourished gentleman in no acute  respiratory distress.  HEENT:  Normocephalic, atraumatic.  OC/OP clear.  NECK:  No JVD.  LUNGS:  Clear to auscultation bilaterally without wheezes or rhonchi.  CARDIOVASCULAR:  Regular rate and rhythm without murmur, gallop or rub.  Normal S1 and S2.  ABDOMEN:  Well-healed midline incision consistent with the patient's AAA  repair.  Nontender,  nondistended, soft bowel sounds present.  No  hepatosplenomegaly, no rebound, no ascites.  EXTREMITIES:  Trace bilateral lower extremity edema.  No significant  cyanosis or clubbing.  NEUROLOGIC:  4+/5 strength in the right lower extremity and right upper  extremity with 5/5 strength in the left upper and lower extremities.  Intact sensation to touch throughout.  Alert and oriented.  There is a  mild degree of aphasia appreciable  GU:  The patient does have a small number of perirectal skin tags  suggestive of possible old spontaneously healed external hemorrhoids.  There is some bright red blood staining on the buttocks.   IMPRESSION AND PLAN:  1. Bright red blood per rectum.  [name] is likely suffering      with simple internal hemorrhoid bleeding.  He is, however, 60 and      has never had a routine screening colonoscopy.  He also has a very      good reason to need aspirin therapy with his recent stroke.  I will      admit the patient to the acute units.  We will monitor his CBC on      an every 12 hour basis to assure that his hemoglobin is stable.  We      will gently hydrate him.  Will consult gastroenterology to go ahead      and accomplish a colonoscopy during his hospital stay to rule out      other potential etiologies of lower gastrointestinal bleeding so      that we can feel confident in returning him to his aspirin therapy.      The patient does have an elevated BUN.  I feel this is more likely      related to volume depletion and chronic renal insufficiency and not      an indicator of upper gastrointestinal bleeding.  Nonetheless, if      gastroenterology feels that upper gastroenterologic evaluation is      appropriate too, I feel that this would be reasonable.  2. Recent cerebrovascular accident.  The patient appears to be doing      well from this standpoint.  We will continue physical therapy and      occupational therapy in the hospital once the patient is  cleared      from a gastroenterologic standpoint.  The patient should be able to      return home to his current setting at such time that he stabilized      from a gastroenterologic standpoint.  3. Hypertension.  The patient's blood pressure is currently well-      controlled.  We will continue his outpatient medical regimen and      follow his blood pressure closely.  4. Tobacco abuse.  We will use a nicotine patch as needed.  I have      congratulated the patient on his current abstinence and encourage      him to continue to do so.  5. Hyperlipidemia.  Will continue Zocor, as liver function tests are      normal.  6. Impaired glucose tolerance.  We will follow capillary blood      glucoses closely during hospital stay and provide sliding scale      insulin as needed.      Lonia Blood, M.D.  Electronically Signed     JTM/MEDQ  D:  02/22/2008  T:  02/22/2008  Job:  [id number]

## 2011-04-17 NOTE — Discharge Summary (Signed)
Gene, Lawson NO.:  [id number]   MEDICAL RECORD NO.:  [id number]          PATIENT TYPE:  INP   LOCATION:  3005                         FACILITY:  [facility]   PHYSICIAN:  Hettie Holstein, D.O.    DATE OF BIRTH:  1951/09/03   DATE OF ADMISSION:  01/31/2008  DATE OF DISCHARGE:                               DISCHARGE SUMMARY   DATE OF DISCHARGE:  Currently awaiting care management to assist in  final disposition.   PRIMARY CARE PHYSICIAN:  Drs. Christell Constant and Fields.   FINAL DIAGNOSES:  1. Left midbrain infarct with resultant left-sided ptosis and      diplopia.  2. Gait instability secondary to stroke.  3. Hypertension.  4. History of abdominal aortic aneurysm repair.  5. Impaired fasting glucose.  6. Dyslipidemia.   MEDICATIONS ON DISCHARGE:  1. Norvasc 10 mg daily.  2. Aspirin 325 mg daily (if a prescription coverage source can be      established, Aggrenox would be more optimal).  3. Benazepril was discontinued due to elevation of Creatinine;  he      will need a repeat basic metabolic panel this week through [facility] to assure stability of his BUN/Cr.  4. Hydrochlorothiazide 25 mg daily.  5. Labetalol 400 mg p.o. b.i.d.  6. Zocor 40 mg daily with instructions that he certainly needs to      follow up with his primary care Vaughan Garfinkle to assure his liver      function remains stable while on this medication.   DISPOSITION:  At present [name] is felt to be medically stable for  the discharge.  However, his disposition is currently in question at  this time.  Our therapists have recommended inpatient rehab.  He has  been evaluated by rehabilitation medicine and was felt to be a suitable  candidate though I am not certain of the financial and insurance  restrictions in this regard.  If this is not possible for him, perhaps  optimization of therapies in the home setting and supervision needs to  be assured and arranged.  For this reason I have  requested the  assistance of care management and social service.   HISTORY OF PRESENTING ILLNESS:  For full details, please refer to the  H&P as dictated by Dr. Lovell Sheehan.  However, briefly [name] is a 60-  year-old male who was brought into the emergency department by his  family secondary to symptoms of dizziness, stumbling and double vision  for the past 2 days.  He denied having any nausea, vomiting, shortness  of breath or similar symptoms.  He had reported being off of his  medications for several months because he did not like the way they made  him feel.  In any event, he was found to be severely hypertensive with a  blood pressure 235/110 in the emergency department.  Initial CT scan was  negative with the exception of old lacunar infarcts in the thalamic  regions bilaterally.   HOSPITAL COURSE:  [name] was admitted.  He underwent a stroke  evaluation and was seen  by the stroke service.  Underwent swallow screen  as described above.  MRI revealed the findings as described above.  He  continued to suffer with diplopia and ataxia.  He was evaluated by the  rehab service and it was felt that he would make a good candidate for a  rehab admission.  However, the payer source, I believe, is problematic  at this point.   LABORATORY DATA:  His hemoglobin A1c this admission was 6.4.  Basic  metabolic panel revealed a sodium 135, potassium 4.7, BUN 40, creatinine  2.21 and glucose of 98.  Homocystine was with normal range.  MRI is  described as above.  He had atrophy and moderate to advanced chronic  microvascular disease as well as acute infarct in the left midbrain.  His carotid duplex was performed.  The preliminary report revealed no  evidence a significant ICA stenosis, and this can be further followed up  by his primary care physician in the outpatient setting with reference  to the final report.  His 2-D echocardiogram revealed an ejection  fraction of 70-75%.   There was LV wall thickness increased with severe  concentric hypertrophy consistent with hypertensive heart disease.      Hettie Holstein, D.O.  Electronically Signed     ESS/MEDQ  D:  02/09/2008  T:  02/10/2008  Job:  [id number]

## 2011-04-17 NOTE — H&P (Signed)
[name], [name] NO.:  [id number]   MEDICAL RECORD NO.:  [id number]          PATIENT TYPE:  INP   LOCATION:  2113                         FACILITY:  [facility]   PHYSICIAN:  Della Goo, M.D. DATE OF BIRTH:  [birth year]   DATE OF ADMISSION:  01/31/2008  DATE OF DISCHARGE:                              HISTORY & PHYSICAL   PRIMARY CARE PHYSICIAN:  Unassigned.  Dr. Rich Brave.   CHIEF COMPLAINT:  Dizziness, double vision.   HISTORY OF PRESENT ILLNESS:  This is a 60 year old male who was brought  to the emergency department by his family secondary to symptoms of  dizziness stumbling and double vision for the past 2 days.  The patient  denies having any nausea, vomiting or shortness of breath.  He denies  having any previous similar symptoms to this.  The patient also reports  being off of his medications for several months because he does not like  the way his medications make him feel.  His family filled his medication  one day ago when the symptoms began.  In the emergency department, the  patient was found to be severely hypertensive with a blood pressure  235/110 and a CT scan was also performed which did not reveal acute  findings.  However, the patient is strongly suspected of having a  cerebrovascular accident.  Results of the CT scan performed in the  emergency department did reveal old lacunar infarcts in the thalamic  areas bilaterally.   PAST MEDICAL HISTORY:  1. Uncontrolled hypertension and noncompliance.  2. Abdominal aortic aneurysm status post repair in March 2007.  3. History of coronary artery disease.  4. Right-sided inguinal hernia   PAST SURGICAL HISTORY:  History of an appendectomy.   MEDICATIONS:  1. Amlodipine 10 mg one p.o. daily.  2. Benazepril 40 mg one p.o. daily.  3. Clonidine 0.1 mg, the interval is not known at this time.   ALLERGIES:  NO KNOWN DRUG ALLERGIES.   SOCIAL HISTORY:  The patient is a smoker, smokes one and  half packs per  day for 30 years plus he also drinks alcohol.  He reports drinking  socially 2-3 drinks every other day.   FAMILY HISTORY:  Unknown.   FAMILY HISTORY:  Unknown.   REVIEW OF SYSTEMS:  Pertinents are mentioned above.   PHYSICAL EXAMINATION FINDINGS:  GENERAL:  This is a 60 year old well-  nourished, well-developed male in no visible distress at this time.  VITAL SIGNS:  Temperature 99.7, blood pressure initially 235/110, after  labetalol given, his blood pressure decreased to 208/114, heart rate 87,  respirations 20 and O2 saturations 96-98%.  HEENT:  Examination normocephalic, atraumatic.  Pupils are reactive to  light.  Extraocular muscles reveals a deficit of conjugate eye movement  of the left eye.  There is no facial drooping apparent.  There is no  tongue deviation.  Funduscopic examination of the eyes negative for any  acute findings.  Chronic hypertensive changes present.  Oropharynx is  clear.  NECK:  Supple full range of motion.  No thyromegaly, adenopathy, jugular  venous distention.  CARDIOVASCULAR:  Regular rate  and rhythm.  No murmurs, gallops or rubs.  LUNGS: Clear to auscultation bilaterally.  ABDOMEN: Positive bowel sounds, soft, nontender, nondistended.  EXTREMITIES: Without cyanosis, clubbing or edema.  NEUROLOGIC:  Cranial nerves are intact except for the extraocular  movements mentioned above.  There is no facial drooping.  No tongue  deviation.  Otherwise, cranial nerves are intact.  The patient is able  to move his upper extremities.  There is no pronator drift.  He is also  able to move his lower extremities as well.  There are no focal deficits  otherwise on examination of motor and sensory function.  Cerebellar  function appears intact.  However, gait has not been assessed.   LABORATORY STUDIES:  White blood cell count 9.9, hemoglobin 14.3,  hematocrit 42.4, platelets 162, neutrophils 62%, lymphocytes 29%.  Sodium 138, potassium 3.9,  chloride 108, bicarb 21.4, BUN 20, creatinine  1.8, glucose 114.  Protime 12.7, INR 0.9.  Urinalysis negative except  for trace hemoglobin.  CT scan of the head mentioned above.   ASSESSMENT:  A 60 year old male being admitted with:  1. CVA versus TIA.  2. Hypertensive urgency.  3. Uncontrolled hypertension.  4. Noncompliance.   PLAN:  The patient will be admitted to an ICU area.  Cardiac monitoring  and neurologic checks will be ordered.  The patient has been placed on a  Cardene drip to help lower his blood pressure.  Further workup for a CVA  has been ordered.  An MRI/MRA study of the brain has been ordered along  with carotid ultrasound study as well.  DVT and GI prophylaxis have been  ordered.  The patient's oral medications will be restarted and the  patient has been counseled regarding the need for strict compliance with  his antihypertensive medications, however, the patient has stated that  he does not plan to take his medications secondary to the reasons  mentioned above.      Della Goo, M.D.  Electronically Signed     HJ/MEDQ  D:  02/01/2008  T:  02/02/2008  Job:  [id number]

## 2011-04-17 NOTE — H&P (Signed)
[name], [name] NO.:  [id number]   MEDICAL RECORD NO.:  [id number]          PATIENT TYPE:  INP   LOCATION:  1826                         FACILITY:  [facility]   PHYSICIAN:  Della Goo, M.D. DATE OF BIRTH:  October 01, 1951   DATE OF ADMISSION:  02/13/2009  DATE OF DISCHARGE:                              HISTORY & PHYSICAL   PRIMARY CARE PHYSICIAN:  [name].   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  He was brought to the emergency department secondary  to increased urination, increased thirst which he has been having for  the past day and a half.  Of note, patient has a history of borderline  diabetes which was diagnosed at the time of his CVA in February 2009 and  the family reports that they had been controlling this with diet.  The  patient has not had any fevers, chills, chest pain, congestion,  shortness of breath, nausea, vomiting or diarrheal symptoms.   When patient was evaluated in the emergency department a blood sugar was  performed and was found to be 1083.  The patient was started on the IV  insulin drip and IV fluids and referred for admission.   PAST MEDICAL HISTORY:  As mentioned above history of cerebrovascular  accident, borderline diabetes before this, hypertension.   PAST SURGICAL HISTORY:  None.   MEDICATIONS:  Amlodipine, simvastatin, clonidine, aspirin and labetalol.   ALLERGIES:  No known drug allergies.   SOCIAL HISTORY:  Patient is a smoker.  He smokes 1 pack of cigarettes  daily.  He drinks 1 quart of beer every 3 days.   FAMILY HISTORY:  Noncontributory.   REVIEW OF SYSTEMS:  Pertinents are mentioned above.   PHYSICAL EXAMINATION FINDINGS:  This is a 60 year old debilitated, well-  developed  male in acute distress.  VITAL SIGNS:  Temperature 98.1, blood pressure 178/108, heart rate 84,  respirations 16, O2 saturations 93%.  HEENT:  Normocephalic, atraumatic.  Pupils are reactive to light  bilaterally.  Extraocular movements intact funduscopic benign.  No  scleral icterus.  Oropharynx has a pasty, dry mucosa.  NECK:  Supple, full range of motion.  No thyromegaly, adenopathy,  jugular venous distention.  CARDIOVASCULAR:  Regular rate and rhythm.  No murmurs, gallops, rubs.  LUNGS:  Clear to auscultation bilaterally.  ABDOMEN:  Positive bowel sounds, soft, nontender, nondistended.  EXTREMITIES:  Without cyanosis, clubbing or edema.  NEUROLOGIC:  The patient is alert and oriented.  He has mild expressive  aphasia and mild right-sided hemiparesis.   LABORATORY STUDIES:  White blood cell count 13.1, hemoglobin 15.9,  hematocrit 47.8, MCV 93.3, platelets 132, neutrophils 81% and  lymphocytes 13%.  Sodium 137, potassium 6.1, chloride 100, carbon  dioxide 20, glucose 1083, BUN 53, creatinine 3.15.   ASSESSMENT:  A 60 year old male being admitted with:  1. Diabetic ketoacidosis versus hyperosmolar nonketotic hyperglycemia.  2. Hyperkalemia in  the setting of hyperglycemia.  3. Acute renal failure with chronic renal insufficiency.  4. Hypertension.  5. Hyperlipidemia.  6. Previous cerebrovascular accident.   PLAN:  The patient will be admitted to the intensive care unit.  Patient  has been placed on the IV insulin protocol and IV fluids have been  ordered as well for fluid resuscitation.  Antiemetics have been ordered  as needed and patient's regular medications will be further reviewed.  He will be placed on non-caloric clears while he is on the insulin drip  and be transitioned to sliding scale insulin coverage with Lantus  therapy.  Diabetic teaching has also been ordered for patient's family.  The patient will be placed on DVT and GI prophylaxis.  Further workup  will ensue  pending results of patient's clinical studies.  Electrolytes  will be monitored while patient is on the insulin drip and replaced or  corrected as needed.      Della Goo, M.D.  Electronically Signed     HJ/MEDQ  D:  02/13/2009  T:  02/13/2009  Job:  [id number]

## 2011-04-17 NOTE — Consult Note (Signed)
[name], [name] NO.:  [id number]   MEDICAL RECORD NO.:  [id number]          PATIENT TYPE:  INP   LOCATION:  2113                         FACILITY:  [facility]   PHYSICIAN:  Darnelle Bos, MDDATE OF BIRTH:  24-Sep-1951   DATE OF CONSULTATION:  01/31/2008  DATE OF DISCHARGE:                                 CONSULTATION   REQUESTING PHYSICIAN:  Emergency room physician.   REASON FOR CONSULTATION:  Dizziness and diplopia.   HISTORY AND FINDINGS:  Mr. [name] is a 60 year old, African  American, right handed male who came in with episodes of dizziness and  double vision.  This started Friday morning when he woke up.  He states  that when he went to bed he was fine.  He denies any headache, denies  any weakness or paresthesias.  He says that when he got up, his eyes  would appear crossed and this would make him feel dizzy.  He denies any  vertigo.  If his eyes are closed, he denies any sensation of dizziness.  This has remained the same since the onset.   He denies any trouble with speech, swallowing, or chewing.  He denies  any weakness or paresthesias.  He denies any cramps.  He denies any  chest pain, shortness of breath, or palpitations.  The patient thinks  that he has had recent fevers with cough but has not checked his  temperature.  He denies any chills.  Denies any weight loss.   PAST MEDICAL HISTORY:  1. Right retinal detachment surgery.  2. AAA repair.  3. Hypertension but the patient has been noncompliant with      medications.   PAST SURGICAL HISTORY:  As above.   SOCIAL HISTORY:  The patient smokes 5-6 cigarettes a day and drinks once  a week 1-2 glasses of beer or liquor.  He does not work.   REVIEW OF SYSTEMS:  All systems were reviewed.  Pertinent positives are  mentioned in the history and physical.   MEDICATIONS:  1. Amlodipine 10 mg a day.  2. Benazepril 40 mg a day.  3. Klonopin 0.1 mg a day.   ALLERGIES:  None.   PHYSICAL EXAMINATION:  VITAL SIGNS:  Blood pressure was anywhere between  180/95 to 237/110, pulse rate 72-87, saturation 96% on room air.  Temperature is 99.7.  GENERAL:  [name] is a pleasant gentleman in no acute distress.  HEENT:  Normocephalic atraumatic.  NECK:  Supple.  No carotid bruits.  CARDIOVASCULAR:  Regular rhythm and rate.  LUNGS:  Clear to A&P.  EXTREMITIES:  No cyanosis, clubbing, or edema.  NEUROLOGIC:  The patient had no dysarthria or aphasia.  Cranial nerves  showed ptosis of the left eye with difficulty abduction on up gaze more  than down gaze.  Visual fields were intact to confrontation.  No facial  asymmetry.  Tongue was midline.  Palatal elevation was symmetric.  Motor  evaluation normal power and tone.  Deep tendon reflexes 1 plus and  symmetric.  Plantars are downward.  Sensation was intact to position,  touch, and pinprick.  Vibration showed mildly decreased bilaterally  at  metatarsophalangeal joints.  Normal finger-to-nose testing and knee-heel  testing.  Gait was not evaluated at this time.   The patient's CT head was reviewed.  It showed bilateral old lacunar  infarcts but nothing new.  His labs were reviewed.  WBC 9.9, hemoglobin  14.3, hematocrit 42, platelets 152.  Sodium 138, potassium 3.9, BUN 20,  creatinine 1.8, glucose 114.  Urine unremarkable.   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  It is likely secondary to ischemia involving the vaso nervorum of the  third nerve, unlikely to be compressive as it is pupil-sparing.   RECOMMENDATIONS:  1. Agree with MRI of the brain to look for a parenchymal cause.  2. I agree with a stroke workup as it is a stroke of the smallest      vessels supplying the nerves.  3. Aspirin for secondary stroke prophylaxis.  4. Control risk factors including elevated blood pressure.  5. Evaluate for other risk factors including evaluating hemoglobin      A1c.  He also has mild  vibratory loss, so would recommend      evaluation of B12.  6. Will recommend patching alternately the right and the left eye      alternately to prevent the discomfort and the headache.  7. We will follow up on the MRI and the workup.      Darnelle Bos, MD  Electronically Signed     RB/MEDQ  D:  02/01/2008  T:  02/01/2008  Job:  [id number]

## 2011-04-17 NOTE — Discharge Summary (Signed)
NAMEBADEN, BETSCH           ACCOUNT NO.:  [id number]   MEDICAL RECORD NO.:  [id number]          PATIENT TYPE:  INP   LOCATION:  6740                         FACILITY:  [facility]   PHYSICIAN:  Madaline Savage, MD        DATE OF BIRTH:  [birth year]   DATE OF ADMISSION:  02/22/2008  DATE OF DISCHARGE:  02/24/2008                               DISCHARGE SUMMARY   PRIMARY CARE PHYSICIAN:  [facility].   CONSULTATION IN THE HOSPITAL:  He was seen by Dr. Elnoria Howard from  gastroenterology.   DISCHARGE DIAGNOSES:  1. Bright red blood per rectum.  Hemoglobin stable at this time,      likely a hemorrhoidal bleed.  2. Recent left brain cerebrovascular accident.  3. Hypertension.  4. History of abdominal aortic aneurysm.  5. Hyperlipidemia.   DISCHARGE MEDICATIONS:  1. Aspirin 81 mg daily.  2. Norvasc 10 mg daily.  3. Clonidine 0.1 mg 3 times daily.  4. Zocor 40 mg daily.  5. Labetalol 40 mg twice daily.   HISTORY OF PRESENT ILLNESS:  Full history and physical and review of  systems was already dictated by Dr. Lonia Blood, M.D.on February 22, 2008.  In short, Mr. Saunders is a 60 year old gentleman who was  recently discharged from the hospital after he had a left brain stroke.  He was started on aspirin 325 mg daily.  He came to the ER with  complaints of bright red blood in his stools.  He says he had a similar  episode in the past.  His initial hemoglobin was 11.2, and he was  admitted for further evaluation and management.   PROBLEM LIST:  1. Bright red blood per rectum.  Mr. Haden was admitted and his      hemoglobin was monitored.  His hemoglobin has been stable in the      hospital.  He was hydrated.  His hemoglobin has been 10.3, 10.4,      10.1, and 10 for the last 3 days.  He states he still sees red      color in his tissue paper.  We consulted gastroenterology, Dr. Elnoria Howard      saw the patient, and we were planning to get a colonoscopy on him,      but he refused to  drink the prep for the colonoscopy.  I discussed      it with him, and he does not want to drink it and at this      standpoint of time, he does not want to get the colonoscopy done.      I discussed it with Dr. Elnoria Howard who has cleared him for discharge as      his hemoglobin has been stable.  Dr. Elnoria Howard has also recommended to      restart the aspirin at 81 mg daily.  2. Recent cerebrovascular accident.  We will restart his aspirin at 81      mg daily.  3. Hypertension.  Blood pressure was stable during his hospital stay.   DISPOSITION:  He has now been discharged to home in a stable  condition.   FOLLOWUP:  He was asked to follow up with [facility] in 2 weeks.      Madaline Savage, MD  Electronically Signed     PKN/MEDQ  D:  02/24/2008  T:  02/25/2008  Job:  [id number]

## 2011-04-17 NOTE — Discharge Summary (Signed)
NAMERAWLEY, HARJU NO.:  [id number]   MEDICAL RECORD NO.:  [id number]          PATIENT TYPE:  INP   LOCATION:  3005                         FACILITY:  [facility]   PHYSICIAN:  Eduard Clos, MDDATE OF BIRTH:  [birth year]   DATE OF ADMISSION:  01/31/2008  DATE OF DISCHARGE:  02/13/2008                               DISCHARGE SUMMARY   Please refer to the discharge summary dictated by Dr. Hannah Beat on  February 10, 2008 for the course in the hospital and early procedures done.   Patient's discharge was withheld because of increased creatinine level.  Patient's creatinine is increased up to 2.4, up to which his Benazepril  was discontinued along with hydrochlorothiazide was also discontinued.  Patient was also hydrated with IV fluids.  Patient was started on  clonidine.  His blood pressure was more well controlled.  His creatinine  at the time of discharge has improved up to 1.8.   At this time, patient is stable and will be discharged home with home  health care, OT and PT, with advice to follow with [facility] on February 16, 2008 and check his basic metabolic panel.  The plan was discussed in  detail with this patient and patient's family.   ASSESSMENT:  1. 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  2. Hypertension.  3. Dyslipidemia.  4. Acute-on-chronic renal failure.   MEDICATIONS ON DISCHARGE:  1. Aspirin 325 mg p.o. daily.  2. Norvasc 10 mg p.o. daily.  3. Labetalol 400 mg p.o. twice daily.  4. Clonidine 0.1 mg p.o. 3 times daily.  5. Simvastatin 40 mg p.o. daily.   PLAN:  Patient is to be discharged home with home healthcare, OT and PT.  To follow with primary care or [facility] on February 16, 2008 and  recheck his basic metabolic panel.  Patient is to be on a cardiac  healthy diet.  Patient also has a pre-diabetic range of blood sugar for  which I have advised the patient and the patient's family that he needs  to closely follow on this with his primary care physician.  Patient is  to be on fall precautions.      Eduard Clos, MD  Electronically Signed     ANK/MEDQ  D:  02/13/2008  T:  02/13/2008  Job:  [id number]

## 2011-04-20 NOTE — Discharge Summary (Signed)
[name], [name]           ACCOUNT NO.:  [id number]   MEDICAL RECORD NO.:  [id number]          PATIENT TYPE:  INP   LOCATION:  2036                         FACILITY:  [facility]   PHYSICIAN:  Larina Earthly, M.D.    DATE OF BIRTH:  Sep 09, 1951   DATE OF ADMISSION:  02/03/2006  DATE OF DISCHARGE:                                 DISCHARGE SUMMARY   ADMISSION DIAGNOSIS:  Abdominal pain.   PAST MEDICAL HISTORY AND DISCHARGE DIAGNOSES:  1.  Hypertension.  2.  Right inguinal hernia repair.  3.  Status post appendectomy in the past.  4.  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   ALLERGIES:  No known drug allergies.   BRIEF HISTORY:  The patient is a 60 year old African-American male with a  known history of hypertension, who developed sudden onset of severe  dizziness on his way to church the date of admission.  This was associated  with right lower quadrant abdominal pain as well as back pain.  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  There was no evidence of dissection or leaking.  Dr. Arbie Cookey of  the CVTS service was consulted and he felt the patient should be taken  emergently to the operating room for repair secondary to symptomatic  abdominal aortic aneurysm.   HOSPITAL COURSE:  The patient was admitted via the emergency room and found  to have a 6.7 cm abdominal aortic aneurysm as previously stated.  The  patient was then taken emergently to the OR for resection and grafting of  abdominal aortic aneurysm with placement of 22 x 11 Hemashield aorto-to-  right external and left common iliac artery bypass.  The inferior mesenteric  artery was reimplanted into the left limb of the aortoiliac graft.  The  patient tolerated  the procedure well and was hemodynamically stable  immediately postoperatively.  The patient was transferred from the OR to the  SICU in stable condition.  The patient was extubated without complication  and woke up from the anesthesia neurologically intact.   The patient's primary issue in the postoperative course has been his  hypertension.  This was initially difficult to control.  The  pulmonary/critical care medicine team became involved in the patient's care  and made several recommendations as to IV medications for better control of  the patient's hypertension.  Throughout the postoperative course the drips  were then weaned and p.o. medications were started via recommendations per  the pulmonary/critical care team and then subsequently the medical teaching  service.  His hypertension is currently well-controlled.   The patient's postoperative course from a surgical standpoint has progressed  as expected.  All invasive lines and tubes were discontinued in a routine  manner.  His diet was slowly restarted and he is currently tolerating a  regular diet at this time.  His  bowel function has returned with active  bowel movements and flatus, although they are intermittent.  He still does  not have much of an appetite.  The patient has not been ambulating very much  since transfer to unit 2000.  A physical therapy consult was ordered and  they recommend home health PT at discharge home.  He has also been somewhat  difficult to wean from his oxygen.  His pulmonary toilet is actively being  worked on.  On postoperative day 7 the patient, as previously stated, is  having active bowel movements.  He still has a decreased appetite.  He is  afebrile with stable vital signs and maintaining a normal sinus rhythm.  The  abdomen is soft with normal-pitched bowel sounds and the incision is healing  well.  As long as the patient continues to progress in the current manner,  he should be ready for  discharge within the next one to two days pending  morning round reevaluation.  Home health physical therapy will be  established for the patient prior to discharge.   LABORATORY DATA:  CBC and BMP on February 10, 2006:  White count 12.4,  hemoglobin 10, hematocrit 28.  Sodium 135, potassium 3.8, BUN 20, creatinine  1.6, glucose 137.   CONDITION ON DISCHARGE:  Stable.   INSTRUCTIONS:  1.  Diet:  Low salt and low fat.  2.  Activity:  The patient should refrain from driving for three weeks.  No      lifting of anything more than 10 pounds for three weeks, and he should      continue to increase his activity daily per recommendations by home      health physical therapy.  3.  Wound care:  The patient may shower daily and clean the incisions with      soap and water.  If wound problems arise, the patient should contact the      CVTS office.   DISCHARGE MEDICATIONS:  1.  Tylox one to two q.4-6h. p.r.n. pain.  2.  Nicotine patch 21 mg q.24h.  3.  Multivitamin daily.  4.  Lopressor 75 mg b.i.d.  5.  Lotensin 20 mg daily.  6.  Clonidine 0.1 mg daily.   FOLLOW-UP APPOINTMENTS:  1.  With Dr. Arbie Cookey three weeks after discharge.  CVTS office will contact      the patient with the date and time of that appointment.  2.  With the medical teaching service as an outpatient.  The patient will be      instructed to that appointment per the medical teaching service prior to      discharge.      Gene Leisure, PA      Larina Earthly, M.D.  Electronically Signed    AY/MEDQ  D:  02/10/2006  T:  02/12/2006  Job:  [id number]

## 2011-04-20 NOTE — Op Note (Signed)
NAMEBRACKEN, MOFFA           ACCOUNT NO.:  [id number]   MEDICAL RECORD NO.:  [id number]          PATIENT TYPE:  INP   LOCATION:  2399                         FACILITY:  [facility]   PHYSICIAN:  Larina Earthly, M.D.    DATE OF BIRTH:  1951/06/06   DATE OF PROCEDURE:  02/03/2006  DATE OF DISCHARGE:                                 OPERATIVE REPORT   PREOPERATIVE DIAGNOSIS:  Symptomatic large abdominal aortic aneurysm.   POSTOPERATIVE DIAGNOSIS:  Symptomatic large abdominal aortic aneurysm.   OPERATION PERFORMED:  1.  Resection and graft abdominal aortic aneurysm with placement of a 22 x      11 Hemashield aorto to right external and left common iliac artery      bypass.  2.  Reimplantation of inferior mesenteric artery to left limb of aortoiliac      graft.   SURGEON:  Larina Earthly, M.D.   ASSISTANT:  Jerold Coombe, P.A.-C.   ANESTHESIA:  General endotracheal, Dr. Katrinka Blazing.   COMPLICATIONS:  None.   DISPOSITION:  Admission to critical care.   INDICATIONS FOR PROCEDURE:  The patient is a 60 year old gentleman who  presented to the emergency room with a several day history of increasing  abdominal and lower back pain.  He had CT scan showing a 6.5 mm infrarenal  abdominal aortic aneurysm with a component of suprarenal aneurysm as well.  This was a dumbbell-shaped aneurysm of near-normal caliber below the level  of the renal arteries of __________.  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  On the right the  patient had a large internal iliac artery aneurysm at the bifurcation of the  iliac artery on the right.  The patient was taken immediately to the  operating room for urgent repair.   DESCRIPTION OF PROCEDURE:  The patient was prepped and draped in the usual  sterile fashion on the abdomen and both groins.  Incision was made from the  level of the xiphoid to pubis, carried down through the midline fascia with  electrocautery.  There was no free intraperitoneal blood.  There was no  retroperitoneal staining but there was retroperitoneal edema.  The Omni-  Tract retractor was used for exposure.  The transverse colon and omentum  were reflected superiorly and the small bowel was reflected to the right.  The retroperitoneum was opened and the aorta was mobilized up to the level  of the renal arteries.  The renal vein was also mobilized.  The dissection  was taken down to the bifurcation and extended down to isolate the left  internal and external iliac arteries on the right.  The external iliac  artery was encircled with a vessel loop.  The internal iliac artery was  dissected down to the __________ and was aneurysmal.  The inferior  mesenteric artery was ligated at its take off from the aneurysm.  A  serrefine clamp was placed on this.  The inferior mesenteric artery was  divided.  The patient was given 25 g of mannitol and 8000 units of  intravenous heparin.  After adequate  circulation time, the aorta was  occluded at the level of the renal arteries and just below the level of the  renal arteries with a Zeiger clamp.  The left internal and external iliac  arteries were occluded as were the right internal and external iliac  arteries with __________.  The aneurysm was opened with electrocautery  longitudinally.  Lumbar back bleeders were controlled with 2-0 silk figure-  of-eight sutures.  The aorta was transected below the level of the proximal  clamp.  Dissection extended down through the aneurysm was dissected down and  the common iliac artery was transected just above the bifurcation of the  iliac artery.  The right external iliac artery was transected.  The internal  iliac artery was oversewn with 3-0 Prolene distally into the base of the  aneurysm.  A 22 x 11 Hemashield graft was brought onto the field using  __________ for reinforcement and sewn end-to-end to the aorta with a running  3-0  Prolene suture.  Anastomosis was tested and found to be adequate.  There  was an area of branch that  __________bleeding from the back of the aorta.  This was controlled with a 3-0 Prolene suture.  The limbs of the graft were  then cut to the appropriate length and were sewn end-to-end to the right  external iliac artery with a running 5-0 Prolene suture and to the left  common iliac artery just above the bifurcation of the iliac arteries.  Prior  to completion of each anastomosis, the usual flush maneuvers were  undertaken.  Anastomosis was completed restoring flow to the legs.  Next the  inferior mesenteric artery serrefine clamp was removed and there was poor  backbleeding from this.  For this reason, decision was made to reimplant the  inferior mesenteric artery.  __________approximation of the left limb of the  aortic __________graft.  Partial occlusion clamp was placed on the left limb  of the graft and the __________effect of the graft was removed.  The  inferior mesenteric artery was spatulated and sewn end-to-side to the graft  with a running 6-0 Prolene suture.  At the completion of this anastomosis  excellent Doppler flow was noted through the inferior mesenteric artery.  The patient was given 100 mg of protamine to reverse the heparin.  Wound was  irrigated with saline, hemostasis cautery.  The wall of the aorta was closed  over the graft with a running 2-0 Vicryl suture. Next the retroperitoneum  was closed with a running 2-0 Vicryl suture.  The small bowel was run in its  entirety and found to be without injury and placed in the pelvis.  Transverse colon and omentum were placed over this.  Midline fascia was  closed with #1 PDS suture beginning proximally and distally and tying in the  middle. Skin was closed with skin clips.  Sterile dressing was applied.  The  patient had 2 to 3 + dorsalis pedis pulses bilaterally and was transferred to the intensive care unit in stable  condition.      Larina Earthly, M.D.  Electronically Signed     TFE/MEDQ  D:  02/03/2006  T:  02/04/2006  Job:  [id number]

## 2011-04-20 NOTE — H&P (Signed)
NAMEGADIEL, [name]           ACCOUNT NO.:  [id number]   MEDICAL RECORD NO.:  [id number]          PATIENT TYPE:  INP   LOCATION:  2305                         FACILITY:  [facility]   PHYSICIAN:  Larina Earthly, M.D.    DATE OF BIRTH:  17-Feb-1951   DATE OF ADMISSION:  02/03/2006  DATE OF DISCHARGE:                                HISTORY & PHYSICAL   CHIEF COMPLAINT:  Abdominal pain since this morning.   HISTORY OF PRESENT ILLNESS:  [name] is a 60 year old African-  American male with history of hypertension who developed sudden onset of  severe dizziness on his way to church. This was associated with right lower  quadrant abdominal pain and back pain. He also had a known right inguinal  hernia but this had never been painful. He then presented to the [facility] emergency department and underwent an abdominal CT scan which  revealed a 6.7 cm juxtarenal abdominal aortic aneurysm extending into the  iliacs. There was no evidence of dissection or leaking. Vascular surgeon,  Dr. Gretta Began, was consulted and felt the patient should be taken  emergently to the operating room for repair of his symptomatic abdominal  aortic aneurysm. After discussing risks/benefits the patient agreed to  proceed.   PAST MEDICAL HISTORY:  1.  Hypertension.  2.  Right inguinal hernia.  3.  No known history of coronary artery disease.   PAST SURGICAL HISTORY:  Remote history of appendectomy.   MEDICATIONS:  None. There is a question that he was taking  hydrochlorothiazide at some time for his hypertension but denied medications  on admission.   ALLERGIES:  No known drug allergies.   SOCIAL HISTORY:  Unknown.   FAMILY HISTORY:  Unknown.   REVIEW OF SYSTEMS:  Per emergency department record, this was negative other  than positive headache on admission and abdominal pain as mentioned above.   PHYSICAL EXAMINATION:  Findings upon Dr. Bosie Helper assessment as well as ED  record.  VITAL  SIGNS IN THE EMERGENCY DEPARTMENT:  Showed an elevated blood pressure  of 238/122, heart rate of 65, respirations 22, pulse oximetry 98% on room  air.  GENERAL:  Initial exam showed he was alert and cooperative and in no acute  distress with Glasgow coma scale of 15.  HEENT:  Oropharynx grossly normal.  NECK:  Grossly normal.  RESPIRATORY:  Lung sounds clear.  CARDIAC:  Heart had a regular rate and rhythm.  ABDOMEN:  Pulsatile mass was palpated in the right lower quadrant. There is  positive associated tenderness with guarding. Otherwise, abdomen was soft  with positive bowel sounds.  EXTREMITIES:  Extremities showed no edema. He had 3+ radial, femoral,  popliteal, and dorsalis pedis pulses bilaterally.  NEUROLOGIC:  Grossly intact. Moves all extremities.   ASSESSMENT:  1.  Symptomatic juxtarenal abdominal aortic aneurysm measuring 6.7 cm by CT      scan.  2.  Hypertension, uncontrolled.   PLAN:  [name] will be taken emergently to the operating room to  undergo repair of his symptomatic abdominal aortic aneurysm. He was given  nitroglycerin in the emergency  department for his severe hypertension.      Jerold Coombe, P.A.      Larina Earthly, M.D.  Electronically Signed    AWZ/MEDQ  D:  02/03/2006  T:  02/04/2006  Job:  [id number]

## 2011-04-20 NOTE — H&P (Signed)
[name], COZBY NO.:  [id number]   MEDICAL RECORD NO.:  [id number]          PATIENT TYPE:  INP   LOCATION:  2399                         FACILITY:  [facility]   PHYSICIAN:  Jerold Coombe, P.A.DATE OF BIRTH:  Apr 14, 1951   DATE OF ADMISSION:  02/03/2006  DATE OF DISCHARGE:                                HISTORY & PHYSICAL   Audio too short to transcribe (less than 5 seconds)      Jerold Coombe, P.A.     AWZ/MEDQ  D:  02/03/2006  T:  02/03/2006  Job:  [id number]

## 2011-06-14 ENCOUNTER — Other Ambulatory Visit: Payer: Self-pay | Admitting: Cardiothoracic Surgery

## 2011-06-14 DIAGNOSIS — I712 Thoracic aortic aneurysm, without rupture: Secondary | ICD-10-CM

## 2011-07-25 ENCOUNTER — Other Ambulatory Visit: Payer: Medicare Other

## 2011-07-25 ENCOUNTER — Encounter: Payer: Medicare Other | Admitting: Cardiothoracic Surgery

## 2011-08-08 ENCOUNTER — Encounter: Payer: Self-pay | Admitting: Cardiothoracic Surgery

## 2011-08-08 ENCOUNTER — Ambulatory Visit (INDEPENDENT_AMBULATORY_CARE_PROVIDER_SITE_OTHER): Payer: Medicare Other | Admitting: Cardiothoracic Surgery

## 2011-08-08 ENCOUNTER — Ambulatory Visit
Admission: RE | Admit: 2011-08-08 | Discharge: 2011-08-08 | Disposition: A | Discharge status: Home / Self Care | Payer: Medicare Other | Source: Ambulatory Visit | Attending: Cardiothoracic Surgery | Admitting: Cardiothoracic Surgery

## 2011-08-08 VITALS — BP 175/99 | HR 76 | Resp 18 | Ht 75.0 in | Wt 176.0 lb

## 2011-08-08 DIAGNOSIS — J438 Other emphysema: Secondary | ICD-10-CM

## 2011-08-08 DIAGNOSIS — I712 Thoracic aortic aneurysm, without rupture, unspecified: Secondary | ICD-10-CM

## 2011-08-08 DIAGNOSIS — J439 Emphysema, unspecified: Secondary | ICD-10-CM

## 2011-08-08 DIAGNOSIS — I10 Essential (primary) hypertension: Secondary | ICD-10-CM

## 2011-08-08 DIAGNOSIS — Z9889 Other specified postprocedural states: Secondary | ICD-10-CM

## 2011-08-08 NOTE — Patient Instructions (Signed)
Stop smoking,control blood pressure, return in a year for follow-up of aortic aneurysm in your chest

## 2011-08-08 NOTE — Progress Notes (Signed)
HPI the patient is a chronically ill 60 year old hypertensive smoker who returns for a 6 month followup CT scan of a distal arch aneurysm measuring approximately 5 cm. It is asymptomatic. He has fairly significant hypertension followed [facility] is on multiple oral medications. Partially smokes one to 2 packs of cigarettes a day. 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    Current Outpatient Prescriptions  Medication Sig Dispense Refill  . allopurinol (ZYLOPRIM) 100 MG tablet Take 100 mg by mouth daily.        . cloNIDine (CATAPRES) 0.2 MG tablet Take 0.2 mg by mouth 2 (two) times daily.        Marland Kitchen diltiazem (TIAZAC) 120 MG 24 hr capsule Take 120 mg by mouth daily.        Marland Kitchen glipiZIDE (GLUCOTROL) 10 MG tablet Take 10 mg by mouth 2 (two) times daily before a meal.        . labetalol (NORMODYNE) 200 MG tablet Take 200 mg by mouth 2 (two) times daily.        Marland Kitchen lisinopril (PRINIVIL,ZESTRIL) 20 MG tablet Take 20 mg by mouth daily.        . salsalate (DISALCID) 500 MG tablet Take 500 mg by mouth 2 (two) times daily.        . simvastatin (ZOCOR) 20 MG tablet Take 20 mg by mouth at bedtime.        . metFORMIN (GLUCOPHAGE) 500 MG tablet TAKE ONE TABLET BY MOUTH TWICE DAILY  60 tablet  11     Review of Systems: No weight loss or fever. No chest pain. Lower back pain from arthritis present.   Physical Exam Blood pressure 170/95 pulse 76 saturation 92% on room air. General appearance is that of a chronically ill lady black male who has had a stroke. Neck exam is with palpable carotid pulses no JVD present. No bruits are detected. Chest exam is without deformity breath sounds are distant but equal. Cardiac exam regular rhythm without murmur or gallop. Abdomen soft well-healed midline incision from the AAA operation. Nonpalpable pedal pulses.   Diagnostic Tests: CT of the chest with IV contrast shows no  aneurysmal disease of the aortic arch. The descending aorta measures 4.1 cm. The large measures 4.2 cm. The distal arch S4 subclavian measures 4.9-5.0 cm with calcification. No penetrating ulcer or flap. The the descending aorta measures 4.0 cm. Severe bullous emphysema of the apices bilaterally. No discrete pulmonary masses noted.   Impression: Stable atherosclerotic disease of the aortic arch with moderate aneurysmal disease the distal arch-proximal descending thoracic aorta. As been stable since the previous CT scan 6 months previously.   Plan: Continued medical management of hypertension smoking and dyslipidemia. Areolar CT scan of the thoracic aorta one year.

## 2011-08-24 LAB — I-STAT 8, (EC8 V) (CONVERTED LAB)
Acid-base deficit: 3 — ABNORMAL HIGH
BUN: 20
Bicarbonate: 21.4
Chloride: 108
Glucose, Bld: 114 — ABNORMAL HIGH
HCT: 45
Hemoglobin: 15.3
Operator id: 151321
Potassium: 3.9
Sodium: 138
TCO2: 22
pCO2, Ven: 36.1 — ABNORMAL LOW
pH, Ven: 7.381 — ABNORMAL HIGH

## 2011-08-24 LAB — CK TOTAL AND CKMB (NOT AT ARMC)
CK, MB: 4.2 — ABNORMAL HIGH
Relative Index: 3.9 — ABNORMAL HIGH
Total CK: 109

## 2011-08-24 LAB — CBC
HCT: 42.4
Hemoglobin: 14.3
MCV: 88.2
WBC: 9.9

## 2011-08-24 LAB — URINE MICROSCOPIC-ADD ON

## 2011-08-24 LAB — DIFFERENTIAL
Eosinophils Relative: 2
Lymphocytes Relative: 29
Lymphs Abs: 2.9
Monocytes Absolute: 0.6

## 2011-08-24 LAB — TROPONIN I: Troponin I: 0.03

## 2011-08-24 LAB — POCT I-STAT CREATININE
Creatinine, Ser: 1.8 — ABNORMAL HIGH
Operator id: 151321

## 2011-08-24 LAB — URINALYSIS, ROUTINE W REFLEX MICROSCOPIC
Bilirubin Urine: NEGATIVE
Ketones, ur: NEGATIVE
Specific Gravity, Urine: 1.009
Urobilinogen, UA: 0.2

## 2011-08-24 LAB — PROTIME-INR
INR: 0.9
Prothrombin Time: 12.7

## 2011-08-27 LAB — BASIC METABOLIC PANEL
BUN: 36 — ABNORMAL HIGH
BUN: 40 — ABNORMAL HIGH
BUN: 47 — ABNORMAL HIGH
BUN: 47 — ABNORMAL HIGH
CO2: 23
CO2: 23
Calcium: 9.3
Calcium: 9.6
Calcium: 9.8
Chloride: 104
Chloride: 108
Chloride: 110
Creatinine, Ser: 1.86 — ABNORMAL HIGH
Creatinine, Ser: 2.21 — ABNORMAL HIGH
Creatinine, Ser: 2.24 — ABNORMAL HIGH
Creatinine, Ser: 2.44 — ABNORMAL HIGH
GFR calc Af Amer: 33 — ABNORMAL LOW
GFR calc Af Amer: 37 — ABNORMAL LOW
GFR calc Af Amer: 45 — ABNORMAL LOW
GFR calc Af Amer: >60
GFR calc non Af Amer: 30 — ABNORMAL LOW
GFR calc non Af Amer: 30 — ABNORMAL LOW
GFR calc non Af Amer: 31 — ABNORMAL LOW
GFR calc non Af Amer: 38 — ABNORMAL LOW
Glucose, Bld: 102 — ABNORMAL HIGH
Glucose, Bld: 103 — ABNORMAL HIGH
Glucose, Bld: 98
Glucose, Bld: 101 — ABNORMAL HIGH
Potassium: 5
Sodium: 135
Sodium: 137
Sodium: 138

## 2011-08-27 LAB — CBC
HCT: 41.5
HCT: 34 — ABNORMAL LOW
Hemoglobin: 10 — ABNORMAL LOW
Hemoglobin: 10.1 — ABNORMAL LOW
Hemoglobin: 11.4 — ABNORMAL LOW
MCHC: 33.6
MCHC: 33.6
MCHC: 33.7
MCHC: 34.1
MCV: 88.1
MCV: 88
MCV: 89.1
MCV: 89.8
Platelets: 159
Platelets: 164
RBC: 4.71
RBC: 3.36 — ABNORMAL LOW
RBC: 3.38 — ABNORMAL LOW
RBC: 3.44 — ABNORMAL LOW
RBC: 3.47 — ABNORMAL LOW
RBC: 3.79 — ABNORMAL LOW
RDW: 14.9
RDW: 15.3
RDW: 15.4
RDW: 15.6 — ABNORMAL HIGH
WBC: 12 — ABNORMAL HIGH
WBC: 11.9 — ABNORMAL HIGH
WBC: 8.7
WBC: 9.5

## 2011-08-27 LAB — COMPREHENSIVE METABOLIC PANEL
ALT: 47
AST: 16
AST: 36
Albumin: 3.4 — ABNORMAL LOW
Alkaline Phosphatase: 61
Alkaline Phosphatase: 56
BUN: 12
CO2: 23
Chloride: 110
Chloride: 109
Creatinine, Ser: 1.29
GFR calc Af Amer: >60
GFR calc Af Amer: 51 — ABNORMAL LOW
GFR calc non Af Amer: 57 — ABNORMAL LOW
Potassium: 4.3
Potassium: 5.7 — ABNORMAL HIGH
Sodium: 137
Total Bilirubin: 0.9
Total Bilirubin: 0.3
Total Protein: 7.3

## 2011-08-27 LAB — LIPID PANEL
LDL Cholesterol: 122 — ABNORMAL HIGH
VLDL: 19

## 2011-08-27 LAB — BLOOD GAS, ARTERIAL
Drawn by: 284591
FIO2: 100
O2 Saturation: 99.5
Patient temperature: 98.6
pH, Arterial: 7.394

## 2011-08-27 LAB — HEMOGLOBIN A1C
Hgb A1c MFr Bld: 6.4 — ABNORMAL HIGH
Hgb A1c MFr Bld: 6.3 — ABNORMAL HIGH
Mean Plasma Glucose: 151
Mean Plasma Glucose: 147

## 2011-08-27 LAB — URINALYSIS, ROUTINE W REFLEX MICROSCOPIC
Bilirubin Urine: NEGATIVE
Hgb urine dipstick: NEGATIVE
Ketones, ur: NEGATIVE
Nitrite: NEGATIVE
Urobilinogen, UA: 0.2

## 2011-08-27 LAB — DIFFERENTIAL
Basophils Relative: 1
Eosinophils Relative: 1
Monocytes Absolute: 0.8
Monocytes Relative: 7
Neutro Abs: 8.7 — ABNORMAL HIGH

## 2011-08-27 LAB — OCCULT BLOOD X 1 CARD TO LAB, STOOL: Fecal Occult Bld: POSITIVE

## 2011-08-27 LAB — APTT: aPTT: 36

## 2011-08-27 LAB — DRUGS OF ABUSE SCREEN W/O ALC, ROUTINE URINE
Amphetamine Screen, Ur: NEGATIVE
Barbiturate Quant, Ur: NEGATIVE
Marijuana Metabolite: NEGATIVE
Phencyclidine (PCP): NEGATIVE
Propoxyphene: NEGATIVE

## 2011-08-27 LAB — CK TOTAL AND CKMB (NOT AT ARMC)
CK, MB: 5.5 — ABNORMAL HIGH
Total CK: 113

## 2011-08-27 LAB — PROTIME-INR
INR: 1
Prothrombin Time: 13.2

## 2011-12-10 ENCOUNTER — Inpatient Hospital Stay (HOSPITAL_COMMUNITY)
Admission: EM | Admit: 2011-12-10 | Discharge: 2011-12-29 | DRG: 637 | Disposition: A | Discharge status: Home Health | Payer: Medicare Other | Source: Ambulatory Visit | Attending: Internal Medicine | Admitting: Internal Medicine

## 2011-12-10 ENCOUNTER — Other Ambulatory Visit: Payer: Self-pay

## 2011-12-10 ENCOUNTER — Encounter (HOSPITAL_COMMUNITY): Payer: Self-pay | Admitting: *Deleted

## 2011-12-10 ENCOUNTER — Emergency Department (HOSPITAL_COMMUNITY): Payer: Medicare Other

## 2011-12-10 DIAGNOSIS — K565 Intestinal adhesions [bands], unspecified as to partial versus complete obstruction: Secondary | ICD-10-CM | POA: Diagnosis present

## 2011-12-10 DIAGNOSIS — M109 Gout, unspecified: Secondary | ICD-10-CM | POA: Diagnosis present

## 2011-12-10 DIAGNOSIS — Z809 Family history of malignant neoplasm, unspecified: Secondary | ICD-10-CM

## 2011-12-10 DIAGNOSIS — N269 Renal sclerosis, unspecified: Secondary | ICD-10-CM | POA: Diagnosis present

## 2011-12-10 DIAGNOSIS — E1110 Type 2 diabetes mellitus with ketoacidosis without coma: Secondary | ICD-10-CM

## 2011-12-10 DIAGNOSIS — H539 Unspecified visual disturbance: Secondary | ICD-10-CM

## 2011-12-10 DIAGNOSIS — X58XXXA Exposure to other specified factors, initial encounter: Secondary | ICD-10-CM | POA: Diagnosis not present

## 2011-12-10 DIAGNOSIS — Z823 Family history of stroke: Secondary | ICD-10-CM

## 2011-12-10 DIAGNOSIS — R1312 Dysphagia, oropharyngeal phase: Secondary | ICD-10-CM | POA: Diagnosis present

## 2011-12-10 DIAGNOSIS — F17200 Nicotine dependence, unspecified, uncomplicated: Secondary | ICD-10-CM | POA: Diagnosis present

## 2011-12-10 DIAGNOSIS — R112 Nausea with vomiting, unspecified: Secondary | ICD-10-CM | POA: Diagnosis not present

## 2011-12-10 DIAGNOSIS — S3720XA Unspecified injury of bladder, initial encounter: Secondary | ICD-10-CM | POA: Diagnosis not present

## 2011-12-10 DIAGNOSIS — Z9089 Acquired absence of other organs: Secondary | ICD-10-CM

## 2011-12-10 DIAGNOSIS — I959 Hypotension, unspecified: Secondary | ICD-10-CM | POA: Diagnosis not present

## 2011-12-10 DIAGNOSIS — I10 Essential (primary) hypertension: Secondary | ICD-10-CM | POA: Diagnosis present

## 2011-12-10 DIAGNOSIS — Z79899 Other long term (current) drug therapy: Secondary | ICD-10-CM

## 2011-12-10 DIAGNOSIS — N179 Acute kidney failure, unspecified: Secondary | ICD-10-CM | POA: Diagnosis present

## 2011-12-10 DIAGNOSIS — E876 Hypokalemia: Secondary | ICD-10-CM | POA: Diagnosis not present

## 2011-12-10 DIAGNOSIS — R319 Hematuria, unspecified: Secondary | ICD-10-CM | POA: Diagnosis not present

## 2011-12-10 DIAGNOSIS — Z9889 Other specified postprocedural states: Secondary | ICD-10-CM

## 2011-12-10 DIAGNOSIS — E785 Hyperlipidemia, unspecified: Secondary | ICD-10-CM | POA: Diagnosis present

## 2011-12-10 DIAGNOSIS — J189 Pneumonia, unspecified organism: Secondary | ICD-10-CM | POA: Diagnosis not present

## 2011-12-10 DIAGNOSIS — B370 Candidal stomatitis: Secondary | ICD-10-CM | POA: Diagnosis not present

## 2011-12-10 DIAGNOSIS — Z7982 Long term (current) use of aspirin: Secondary | ICD-10-CM

## 2011-12-10 DIAGNOSIS — K56609 Unspecified intestinal obstruction, unspecified as to partial versus complete obstruction: Secondary | ICD-10-CM

## 2011-12-10 DIAGNOSIS — Z794 Long term (current) use of insulin: Secondary | ICD-10-CM

## 2011-12-10 DIAGNOSIS — H532 Diplopia: Secondary | ICD-10-CM | POA: Diagnosis present

## 2011-12-10 DIAGNOSIS — I69998 Other sequelae following unspecified cerebrovascular disease: Secondary | ICD-10-CM

## 2011-12-10 DIAGNOSIS — N189 Chronic kidney disease, unspecified: Secondary | ICD-10-CM | POA: Diagnosis present

## 2011-12-10 DIAGNOSIS — S3730XA Unspecified injury of urethra, initial encounter: Secondary | ICD-10-CM | POA: Diagnosis not present

## 2011-12-10 DIAGNOSIS — R0902 Hypoxemia: Secondary | ICD-10-CM | POA: Diagnosis not present

## 2011-12-10 DIAGNOSIS — E871 Hypo-osmolality and hyponatremia: Secondary | ICD-10-CM | POA: Diagnosis present

## 2011-12-10 DIAGNOSIS — Z8249 Family history of ischemic heart disease and other diseases of the circulatory system: Secondary | ICD-10-CM

## 2011-12-10 DIAGNOSIS — I69993 Ataxia following unspecified cerebrovascular disease: Secondary | ICD-10-CM

## 2011-12-10 DIAGNOSIS — E1310 Other specified diabetes mellitus with ketoacidosis without coma: Principal | ICD-10-CM | POA: Diagnosis present

## 2011-12-10 DIAGNOSIS — Z833 Family history of diabetes mellitus: Secondary | ICD-10-CM

## 2011-12-10 DIAGNOSIS — I129 Hypertensive chronic kidney disease with stage 1 through stage 4 chronic kidney disease, or unspecified chronic kidney disease: Secondary | ICD-10-CM | POA: Diagnosis present

## 2011-12-10 HISTORY — DX: Abdominal aortic aneurysm, without rupture: I71.4

## 2011-12-10 HISTORY — DX: Unspecified cataract: H26.9

## 2011-12-10 HISTORY — DX: Disorder of kidney and ureter, unspecified: N28.9

## 2011-12-10 HISTORY — DX: Aortic aneurysm of unspecified site, without rupture: I71.9

## 2011-12-10 HISTORY — DX: Cerebral infarction, unspecified: I63.9

## 2011-12-10 HISTORY — DX: Abdominal aortic aneurysm, without rupture, unspecified: I71.40

## 2011-12-10 LAB — POCT I-STAT, CHEM 8
Calcium, Ion: 1.21 mmol/L (ref 1.12–1.32)
HCT: 47 % (ref 39.0–52.0)
TCO2: 20 mmol/L (ref 0–100)

## 2011-12-10 MED ORDER — SODIUM CHLORIDE 0.9 % IV BOLUS (SEPSIS)
1000.0000 mL | Freq: Once | INTRAVENOUS | Status: AC
  Administered 2011-12-11: 1000 mL via INTRAVENOUS

## 2011-12-10 NOTE — ED Notes (Signed)
C/o high cbg, not registering it is too high, also frequent urination, stays thirsty & difficulty swallowing foods, "throat swelling". Mentions chest aneurysm being monitored. Pt of [facility] Dr. Clelia Croft. Denies pain. Alert, interactive, skin cool & dry, NAD. calm.

## 2011-12-10 NOTE — ED Notes (Signed)
Pt unable to void at this time urinal at bedside 

## 2011-12-10 NOTE — ED Provider Notes (Addendum)
History     CSN: [id number]  Arrival date & time 12/10/11  2201   First MD Initiated Contact with Patient 12/10/11 2318      Chief Complaint  Patient presents with  . Hyperglycemia    (Consider location/radiation/quality/duration/timing/severity/associated sxs/prior treatment) HPI Comments: 61 year old male with a history of hypertension, diabetes, abdominal aortic aneurysm status post repair, thoracic aneurysm, and history of stroke who presents with a complaint of high blood sugar. According to his sister he has had elevated blood sugars for the last 12 days, this is gradually getting worse in the last 2 days it has registered as too high to register. He admits to having ongoing thirst, urinary frequency, dry mouth but no chest pain no abdominal pain no nausea or vomiting and no fevers or chills. He denies rashes, diarrhea, swelling, headaches, change in vision. The family members deny any missed medications.  Currently he is taking glipizide for his diabetes.  Symptoms are gradually getting worse, severe gradual onset  The history is provided by the patient, medical records and a relative.    Past Medical History  Diagnosis Date  . Diabetes mellitus   . Hypertension   . Aneurysm of aorta   . Aneurysm of abdominal aorta   . Stroke     CVA effected  eye  & leg  . Cataract, bilateral   . Renal disorder     History reviewed. No pertinent past surgical history.  Family History  Problem Relation Age of Onset  . Cancer Mother   . Diabetes Mother   . Heart failure Father   . Stroke Father     History  Substance Use Topics  . Smoking status: Current Everyday Smoker -- 1.0 packs/day  . Smokeless tobacco: Not on file  . Alcohol Use: Yes     little bit      Review of Systems  All other systems reviewed and are negative.    Allergies  Review of patient's allergies indicates no known allergies.  Home Medications   Current Outpatient Rx  Name Route Sig Dispense  Refill  . ALLOPURINOL 100 MG PO TABS Oral Take 100 mg by mouth daily.      . ASPIRIN EC 81 MG PO TBEC Oral Take 81 mg by mouth every morning.      Marland Kitchen CLONIDINE HCL 0.2 MG PO TABS Oral Take 0.2 mg by mouth 3 (three) times daily.     Marland Kitchen DILTIAZEM HCL ER BEADS 360 MG PO CP24 Oral Take 360 mg by mouth daily.      Marland Kitchen GLIPIZIDE 10 MG PO TABS Oral Take 10 mg by mouth 2 (two) times daily before a meal.      . LABETALOL HCL 100 MG PO TABS Oral Take 100 mg by mouth 2 (two) times daily.      Marland Kitchen LISINOPRIL 20 MG PO TABS Oral Take 20 mg by mouth 2 (two) times daily.     Marland Kitchen SALSALATE 500 MG PO TABS Oral Take 500 mg by mouth 3 (three) times daily.     Marland Kitchen SIMVASTATIN 20 MG PO TABS Oral Take 20 mg by mouth at bedtime.      Marland Kitchen VITAMIN B-12 1000 MCG PO TABS Oral Take 1,000 mcg by mouth daily.        BP 163/80  Pulse 63  Temp(Src) 97.9 F (36.6 C) (Oral)  Resp 18  SpO2 95%  Physical Exam  Nursing note and vitals reviewed. Constitutional: He appears well-developed and well-nourished. No distress.  HENT:  Head: Normocephalic and atraumatic.  Mouth/Throat: No oropharyngeal exudate.       Mucous membranes dehydrated  Eyes: Conjunctivae and EOM are normal. Pupils are equal, round, and reactive to light. Right eye exhibits no discharge. Left eye exhibits no discharge. No scleral icterus.  Neck: Normal range of motion. Neck supple. No JVD present. No thyromegaly present.  Cardiovascular: Normal rate, regular rhythm, normal heart sounds and intact distal pulses.  Exam reveals no gallop and no friction rub.   No murmur heard. Pulmonary/Chest: Effort normal and breath sounds normal. No respiratory distress. He has no wheezes. He has no rales. He exhibits no tenderness.  Abdominal: Soft. Bowel sounds are normal. He exhibits no distension and no mass. There is no tenderness.       Benign, soft, nontender, normal bowel sounds  Musculoskeletal: Normal range of motion. He exhibits no edema and no tenderness.    Lymphadenopathy:    He has no cervical adenopathy.  Neurological: He is alert. Coordination normal.  Skin: Skin is warm and dry. No rash noted. No erythema.  Psychiatric: He has a normal mood and affect. His behavior is normal.    ED Course  Procedures (including critical care time)  Labs Reviewed  URINALYSIS, ROUTINE W REFLEX MICROSCOPIC - Abnormal; Notable for the following:    Glucose, UA >1000 (*)    Hgb urine dipstick SMALL (*)    Ketones, ur 15 (*)    Protein, ur 100 (*)    All other components within normal limits  GLUCOSE, CAPILLARY - Abnormal; Notable for the following:    Glucose-Capillary >600 (*)    All other components within normal limits  COMPREHENSIVE METABOLIC PANEL - Abnormal; Notable for the following:    Sodium 129 (*)    Potassium 5.4 (*)    Chloride 94 (*)    CO2 14 (*)    Glucose, Bld 773 (*)    BUN 58 (*)    Creatinine, Ser 2.88 (*)    Total Bilirubin 0.2 (*)    GFR calc non Af Amer 22 (*)    GFR calc Af Amer 25 (*)    All other components within normal limits  POCT I-STAT, CHEM 8 - Abnormal; Notable for the following:    Sodium 131 (*)    Potassium 5.4 (*)    BUN 55 (*)    Creatinine, Ser 2.90 (*)    Glucose, Bld >700 (*)    All other components within normal limits  GLUCOSE, CAPILLARY - Abnormal; Notable for the following:    Glucose-Capillary >600 (*)    All other components within normal limits  CBC  DIFFERENTIAL  URINE MICROSCOPIC-ADD ON  I-STAT, CHEM 8  POCT CBG MONITORING   Dg Chest Port 1 View  12/11/2011  *RADIOLOGY REPORT*  Clinical Data: Short of breath  PORTABLE CHEST - 1 VIEW  Comparison: Chest radiograph 02/21/2011  Findings: Stable enlarged heart silhouette with ectatic aorta. There is chronic linear scarring at the lung bases.  Emphysematous change in the upper lobes.  No pneumothorax.  IMPRESSION:  1.  No clear acute cardiopulmonary process.  2.   Chronic scarring at the lung bases. 3.  Emphysematous change.  Original Report  Authenticated By: Genevive Bi, M.D.     1. DKA (diabetic ketoacidoses)       MDM  Vital signs reflecting a mild tachypnea but no tachycardia. Hypertension at 178/94 and oxygen saturation of 95% on room air. Family member and patient endorsed a cough  that has been persistent over the last couple of months. He is a smoker, has had no fevers and the cough is nonproductive.  Will check chest x-ray, labs, evaluate for potassium level as well as acidosis, IV fluids started with bolus, followed with insulin. Rule out diabetes ketoacidosis.   Patient reexamined, has persistent mild hypertension, severe hyperglycemia with an anion gap of 21. Hyponatremia and hyperkalemia 5.4. Hyperglycemia shows 770.  2 boluses of normal saline, followed by insulin drip, critical care provided for DKA.  CRITICAL CARE Performed by: Vida Roller   Total critical care time: 35  Critical care time was exclusive of separately billable procedures and treating other patients.  Critical care was necessary to treat or prevent imminent or life-threatening deterioration.  Critical care was time spent personally by me on the following activities: development of treatment plan with patient and/or surrogate as well as nursing, discussions with consultants, evaluation of patient's response to treatment, examination of patient, obtaining history from patient or surrogate, ordering and performing treatments and interventions, ordering and review of laboratory studies, ordering and review of radiographic studies, pulse oximetry and re-evaluation of patient's condition.  D/w Dr. Toniann Fail who will admit   Vida Roller, MD 12/11/11 0141   ED ECG REPORT   Date: 12/10/11 23:53 PM  Rate: 61  Rhythm: normal sinus rhythm  QRS Axis: left  Intervals: PR prolonged  ST/T Wave abnormalities: nonspecific ST/T changes  Conduction Disutrbances:first-degree A-V block   Narrative Interpretation:   Old EKG Reviewed: unchanged  compared with 02/04/2006   Vida Roller, MD 12/11/11 [phone]

## 2011-12-10 NOTE — ED Notes (Signed)
Pt sister in lobby has pt. meds and stated that he is not able to give complete info.  Please take her back to the room with [name]

## 2011-12-11 ENCOUNTER — Encounter (HOSPITAL_COMMUNITY): Payer: Self-pay | Admitting: Internal Medicine

## 2011-12-11 ENCOUNTER — Emergency Department (HOSPITAL_COMMUNITY): Payer: Medicare Other

## 2011-12-11 ENCOUNTER — Other Ambulatory Visit: Payer: Self-pay

## 2011-12-11 ENCOUNTER — Inpatient Hospital Stay (HOSPITAL_COMMUNITY): Payer: Medicare Other

## 2011-12-11 DIAGNOSIS — E785 Hyperlipidemia, unspecified: Secondary | ICD-10-CM | POA: Diagnosis present

## 2011-12-11 DIAGNOSIS — E1110 Type 2 diabetes mellitus with ketoacidosis without coma: Secondary | ICD-10-CM | POA: Diagnosis present

## 2011-12-11 LAB — CULTURE, BLOOD (ROUTINE X 2)
Culture  Setup Time: 201301082209
Culture  Setup Time: 201301082209

## 2011-12-11 LAB — URINE CULTURE: Culture: NO GROWTH

## 2011-12-11 LAB — BASIC METABOLIC PANEL
BUN: 46 mg/dL — ABNORMAL HIGH (ref 6–23)
BUN: 44 mg/dL — ABNORMAL HIGH (ref 6–23)
BUN: 42 mg/dL — ABNORMAL HIGH (ref 6–23)
CO2: 19 mEq/L (ref 19–32)
CO2: 18 mEq/L — ABNORMAL LOW (ref 19–32)
CO2: 20 mEq/L (ref 19–32)
Calcium: 9.5 mg/dL (ref 8.4–10.5)
Calcium: 8.7 mg/dL (ref 8.4–10.5)
Chloride: 108 mEq/L (ref 96–112)
Chloride: 108 mEq/L (ref 96–112)
Chloride: 105 mEq/L (ref 96–112)
Chloride: 106 mEq/L (ref 96–112)
Creatinine, Ser: 2.54 mg/dL — ABNORMAL HIGH (ref 0.50–1.35)
Creatinine, Ser: 2.46 mg/dL — ABNORMAL HIGH (ref 0.50–1.35)
GFR calc Af Amer: 28 mL/min — ABNORMAL LOW (ref >90)
GFR calc non Af Amer: 24 mL/min — ABNORMAL LOW (ref >90)
Glucose, Bld: 339 mg/dL — ABNORMAL HIGH (ref 70–99)
Glucose, Bld: 189 mg/dL — ABNORMAL HIGH (ref 70–99)
Glucose, Bld: 175 mg/dL — ABNORMAL HIGH (ref 70–99)
Potassium: 4 mEq/L (ref 3.5–5.1)
Potassium: 4 mEq/L (ref 3.5–5.1)
Potassium: 3.9 mEq/L (ref 3.5–5.1)
Sodium: 142 mEq/L (ref 135–145)
Sodium: 135 mEq/L (ref 135–145)
Sodium: 137 mEq/L (ref 135–145)

## 2011-12-11 LAB — CBC
HCT: 37 % — ABNORMAL LOW (ref 39.0–52.0)
MCH: 30.8 pg (ref 26.0–34.0)
MCH: 29.8 pg (ref 26.0–34.0)
MCHC: 35.6 g/dL (ref 30.0–36.0)
MCHC: 35.4 g/dL (ref 30.0–36.0)
MCV: 86.4 fL (ref 78.0–100.0)
MCV: 84.3 fL (ref 78.0–100.0)
Platelets: 149 10*3/uL — ABNORMAL LOW (ref 150–400)
RDW: 14.8 % (ref 11.5–15.5)
RDW: 14.7 % (ref 11.5–15.5)

## 2011-12-11 LAB — URINE MICROSCOPIC-ADD ON

## 2011-12-11 LAB — URINALYSIS, ROUTINE W REFLEX MICROSCOPIC
Bilirubin Urine: NEGATIVE
Bilirubin Urine: NEGATIVE
Ketones, ur: 15 mg/dL — AB
Nitrite: NEGATIVE
Protein, ur: 100 mg/dL — AB
Protein, ur: >300 mg/dL — AB
Specific Gravity, Urine: 1.017 (ref 1.005–1.030)
Urobilinogen, UA: 0.2 mg/dL (ref 0.0–1.0)
pH: 5 (ref 5.0–8.0)

## 2011-12-11 LAB — HEMOGLOBIN A1C: Mean Plasma Glucose: 263 mg/dL — ABNORMAL HIGH (ref <117)

## 2011-12-11 LAB — BLOOD GAS, ARTERIAL
Bicarbonate: 19 mEq/L — ABNORMAL LOW (ref 20.0–24.0)
Patient temperature: 98.6
TCO2: 20.1 mmol/L (ref 0–100)
pH, Arterial: 7.369 (ref 7.350–7.450)

## 2011-12-11 LAB — GLUCOSE, CAPILLARY
Glucose-Capillary: >600 mg/dL (ref 70–99)
Glucose-Capillary: 485 mg/dL — ABNORMAL HIGH (ref 70–99)
Glucose-Capillary: 287 mg/dL — ABNORMAL HIGH (ref 70–99)
Glucose-Capillary: 169 mg/dL — ABNORMAL HIGH (ref 70–99)
Glucose-Capillary: 92 mg/dL (ref 70–99)
Glucose-Capillary: 325 mg/dL — ABNORMAL HIGH (ref 70–99)
Glucose-Capillary: 258 mg/dL — ABNORMAL HIGH (ref 70–99)
Glucose-Capillary: 173 mg/dL — ABNORMAL HIGH (ref 70–99)
Glucose-Capillary: 133 mg/dL — ABNORMAL HIGH (ref 70–99)

## 2011-12-11 LAB — COMPREHENSIVE METABOLIC PANEL
ALT: 11 U/L (ref 0–53)
Alkaline Phosphatase: 102 U/L (ref 39–117)
CO2: 14 mEq/L — ABNORMAL LOW (ref 19–32)
Calcium: 10.2 mg/dL (ref 8.4–10.5)
GFR calc Af Amer: 25 mL/min — ABNORMAL LOW (ref >90)
GFR calc non Af Amer: 22 mL/min — ABNORMAL LOW (ref >90)
Glucose, Bld: 773 mg/dL (ref 70–99)
Potassium: 5.4 mEq/L — ABNORMAL HIGH (ref 3.5–5.1)
Sodium: 129 mEq/L — ABNORMAL LOW (ref 135–145)

## 2011-12-11 LAB — CARDIAC PANEL(CRET KIN+CKTOT+MB+TROPI)
CK, MB: 10.8 ng/mL (ref 0.3–4.0)
CK, MB: 8.9 ng/mL (ref 0.3–4.0)
Total CK: 219 U/L (ref 7–232)
Troponin I: <0.3 ng/mL (ref <0.30)

## 2011-12-11 LAB — DIFFERENTIAL
Basophils Absolute: 0 10*3/uL (ref 0.0–0.1)
Eosinophils Absolute: 0.1 10*3/uL (ref 0.0–0.7)
Eosinophils Relative: 1 % (ref 0–5)
Lymphs Abs: 3.2 10*3/uL (ref 0.7–4.0)
Monocytes Absolute: 0.9 10*3/uL (ref 0.1–1.0)

## 2011-12-11 MED ORDER — DEXTROSE-NACL 5-0.45 % IV SOLN: INTRAVENOUS | Status: DC

## 2011-12-11 MED ORDER — SODIUM CHLORIDE 0.9 % IV SOLN: INTRAVENOUS | Status: AC

## 2011-12-11 MED ORDER — BD GETTING STARTED TAKE HOME KIT: 3/10ML X 30G SYRINGES
1.0000 | Freq: Once | Status: AC
  Administered 2011-12-12: 1
  Filled 2011-12-11: qty 1

## 2011-12-11 MED ORDER — NICOTINE 21 MG/24HR TD PT24
21.0000 mg | MEDICATED_PATCH | Freq: Every day | TRANSDERMAL | Status: DC
  Administered 2011-12-11 – 2011-12-29 (×19): 21 mg via TRANSDERMAL
  Filled 2011-12-11 (×19): qty 1

## 2011-12-11 MED ORDER — POTASSIUM CHLORIDE 10 MEQ/100ML IV SOLN
10.0000 meq | INTRAVENOUS | Status: DC
  Filled 2011-12-11 (×4): qty 100

## 2011-12-11 MED ORDER — LABETALOL HCL 5 MG/ML IV SOLN
10.0000 mg | INTRAVENOUS | Status: DC | PRN
  Administered 2011-12-13 (×3): 10 mg via INTRAVENOUS
  Filled 2011-12-11 (×3): qty 4

## 2011-12-11 MED ORDER — INSULIN GLARGINE 100 UNIT/ML ~~LOC~~ SOLN
10.0000 [IU] | Freq: Once | SUBCUTANEOUS | Status: AC
  Administered 2011-12-11: 10 [IU] via SUBCUTANEOUS
  Filled 2011-12-11: qty 3

## 2011-12-11 MED ORDER — DEXTROSE-NACL 5-0.45 % IV SOLN
INTRAVENOUS | Status: DC
  Administered 2011-12-11 (×2): via INTRAVENOUS

## 2011-12-11 MED ORDER — ONDANSETRON HCL 4 MG/2ML IJ SOLN: 4.0000 mg | Freq: Three times a day (TID) | INTRAMUSCULAR | Status: DC | PRN

## 2011-12-11 MED ORDER — SODIUM CHLORIDE 0.9 % IV SOLN
INTRAVENOUS | Status: AC
  Administered 2011-12-11 – 2011-12-14 (×9): via INTRAVENOUS

## 2011-12-11 MED ORDER — DEXTROSE 50 % IV SOLN
25.0000 mL | INTRAVENOUS | Status: DC | PRN
  Filled 2011-12-11: qty 50

## 2011-12-11 MED ORDER — INSULIN GLARGINE 100 UNIT/ML ~~LOC~~ SOLN
10.0000 [IU] | Freq: Every day | SUBCUTANEOUS | Status: DC
  Administered 2011-12-11: 10 [IU] via SUBCUTANEOUS
  Filled 2011-12-11: qty 3

## 2011-12-11 MED ORDER — SIMVASTATIN 20 MG PO TABS
20.0000 mg | ORAL_TABLET | Freq: Every day | ORAL | Status: DC
  Administered 2011-12-11: 20 mg via ORAL
  Filled 2011-12-11 (×2): qty 1

## 2011-12-11 MED ORDER — SODIUM CHLORIDE 0.9 % IV SOLN
INTRAVENOUS | Status: DC
  Administered 2011-12-11: 150 mL/h via INTRAVENOUS

## 2011-12-11 MED ORDER — INSULIN ASPART 100 UNIT/ML ~~LOC~~ SOLN
0.0000 [IU] | Freq: Three times a day (TID) | SUBCUTANEOUS | Status: DC
  Administered 2011-12-12: 11 [IU] via SUBCUTANEOUS
  Filled 2011-12-11: qty 3

## 2011-12-11 MED ORDER — SODIUM CHLORIDE 0.9 % IV SOLN: INTRAVENOUS | Status: DC

## 2011-12-11 MED ORDER — INSULIN GLARGINE 100 UNIT/ML ~~LOC~~ SOLN: 10.0000 [IU] | Freq: Every day | SUBCUTANEOUS | Status: DC

## 2011-12-11 MED ORDER — SODIUM CHLORIDE 0.9 % IV SOLN
INTRAVENOUS | Status: AC
  Administered 2011-12-11: 5.4 [IU]/h via INTRAVENOUS
  Filled 2011-12-11: qty 1

## 2011-12-11 MED ORDER — DEXTROSE 50 % IV SOLN: 25.0000 mL | INTRAVENOUS | Status: DC | PRN

## 2011-12-11 MED ORDER — SODIUM CHLORIDE 0.9 % IV SOLN
INTRAVENOUS | Status: DC
  Administered 2011-12-11: 16:00:00 via INTRAVENOUS
  Filled 2011-12-11: qty 1

## 2011-12-11 MED ORDER — BD GETTING STARTED TAKE HOME KIT: 1/2ML X 30G SYRINGES
1.0000 | Freq: Once | Status: DC
  Filled 2011-12-11: qty 1

## 2011-12-11 MED ORDER — INSULIN REGULAR BOLUS VIA INFUSION
0.0000 [IU] | Freq: Three times a day (TID) | INTRAVENOUS | Status: DC
  Filled 2011-12-11 (×4): qty 10

## 2011-12-11 MED ORDER — LABETALOL HCL 100 MG PO TABS
100.0000 mg | ORAL_TABLET | Freq: Two times a day (BID) | ORAL | Status: DC
  Administered 2011-12-11 – 2011-12-14 (×5): 100 mg via ORAL
  Filled 2011-12-11 (×8): qty 1

## 2011-12-11 MED ORDER — CLONIDINE HCL 0.2 MG PO TABS
0.2000 mg | ORAL_TABLET | Freq: Three times a day (TID) | ORAL | Status: DC
  Administered 2011-12-11 – 2011-12-14 (×7): 0.2 mg via ORAL
  Filled 2011-12-11 (×12): qty 1

## 2011-12-11 MED ORDER — LIVING WELL WITH DIABETES BOOK
Freq: Once | Status: AC
  Administered 2011-12-12: 10:00:00
  Filled 2011-12-11 (×2): qty 1

## 2011-12-11 MED ORDER — DILTIAZEM HCL ER BEADS 240 MG PO CP24
360.0000 mg | ORAL_CAPSULE | Freq: Every day | ORAL | Status: DC
  Administered 2011-12-11 – 2011-12-12 (×2): 360 mg via ORAL
  Filled 2011-12-11 (×3): qty 1

## 2011-12-11 NOTE — Progress Notes (Signed)
Pt's CBGs still elevated even while on the insulin gtt.  Dr. Janee Morn paged and made of aware of CBGs and decrease in CO2 from current BMET.  Order to continue insulin gtt and follow protocol.  Stated he will leave lantus overlap orders in case pt meets criteria overnight to make the transition off the gtt.

## 2011-12-11 NOTE — Progress Notes (Signed)
Utilization review completed.  

## 2011-12-11 NOTE — ED Notes (Signed)
Old and new ECG given to Dr. Hyacinth Meeker

## 2011-12-11 NOTE — Progress Notes (Signed)
Inpatient Diabetes Program Recommendations  AACE/ADA: New Consensus Statement on Inpatient Glycemic Control (2009)  Target Ranges:  Prepandial:   less than 140 mg/dL      Peak postprandial:   less than 180 mg/dL (1-2 hours)      Critically ill patients:  140 - 180 mg/dL   Reason for Visit: Note patient admitted with DKA.  CO2 at 10:21 was 19 and Anion Gap=12.  Will likely transition off insulin drip soon.  Inpatient Diabetes Program Recommendations Insulin - Basal: Consider Lantus 20 units daily (when patient is transitioned off the insulin drip) Insulin - Meal Coverage: Will likely need Novolog meal coverage also when transitioned off the insulin drip.  Consider Novolog 3 units tid with meals (once IV insulin stopped) to cover CHO intake. HgbA1C: Please order to determine prehospitalization glycemic control.  Note: Called and discussed with Dr. Janee Morn.  He states he is waiting for 12 noon BMET prior to initiating transition orders.  Will follow and discuss with RN.

## 2011-12-11 NOTE — ED Notes (Signed)
Pt. Transferred to the floor via stretcher with RN, Pt. Alert and oriented, NAD noted,

## 2011-12-11 NOTE — Progress Notes (Signed)
Speech Language/Pathology Clinical/Bedside Swallow Evaluation Patient Details  Name: [name] MRN: [id number] DOB: Sep 12, 1951 Today's Date: 12/11/2011  Past Medical History:  Past Medical History  Diagnosis Date  . Diabetes mellitus   . Hypertension   . Aneurysm of aorta   . Aneurysm of abdominal aorta   . Stroke     CVA effected  eye  & leg  . Cataract, bilateral   . Renal disorder    Past Surgical History:  Past Surgical History  Procedure Date  . Abdominal aortic aneurysm repair   . Hernia repair    HPI:  Pt is a 61 year old male admitted with diabetes and high blood sugar. Pt lives with his sister who reports he was pocketing food while eating his meals due to poor alertness. Pt has a history of CVA with CT showing old basal ganglia lacunar infarcts. Pt and family deny any difficulty swallowing due to CVA. Lungs are clear.    Assessment/Recommendations/Treatment Plan Suspected Esophageal Findings Suspected Esophageal Findings: Belching  SLP Assessment Clinical Impression Statement: Pt presents with a mild oral dysphagia with slow mastication, but functional preparation and transit of solid boluses.  Audbile swallow indicates mild delay in swallow response vs swallowing air with bolus further supported by frequent belching, no signs of aspiration observed. Feel pts difficutly swallowing prior to admit primarily related to poor alertness due to blood sugar levels. Will recommend a Dys 3 diet (poor dentition) with thin liquids and full supervision with aspiration precautions. No SLP f/u needed at this time.  Risk for Aspiration: Mild Other Related Risk Factors: Lethargy  Swallow Recommendations Solid Consistency: Dysphagia 3 (Mechanical soft) Liquid Consistency: Thin Liquid Administration via: Cup;Straw Medication Administration: Whole meds with liquid Supervision: Patient able to self feed;Full supervision/cueing for compensatory strategies Compensations: Slow  rate;Small sips/bites Postural Changes and/or Swallow Maneuvers: Seated upright 90 degrees;Upright 30-60 min after meal Oral Care Recommendations: Oral care BID;Patient independent with oral care Follow up Recommendations: None  Treatment Plan Treatment Plan Recommendations: No treatment recommended at this time     Individuals Consulted Consulted and Agree with Results and Recommendations: Patient;Family member/caregiver Family Member Consulted: sister [name], [place] CCC-SLP [phone]  Claudine Mouton 12/11/2011,9:08 AM

## 2011-12-11 NOTE — H&P (Signed)
[name] is an 61 y.o. male.   PCP - [facility]. Chief Complaint: Increasing blood sugar and increasing urinary frequency. HPI: 61 year-old male with history of diabetes mellitus type 2, history of abdominal aortic aneurysm status post repair, thoracic aneurysm, hypertension previous history of stroke which left the patient with diplopia and gait disorder presented to the ER because of his blood sugar has been increasing with increasing frequency. The patient was found to have very high blood sugars more than 700 with anion gap. Patient has been admitted for DKA. Patient denies any nausea vomiting, abdominal pain diarrhea dysuria, denies any fever chills chest pain or any focal deficits. For the last 2 days patient has been found to have increasing difficulty swallowing.  Past Medical History  Diagnosis Date  . Diabetes mellitus   . Hypertension   . Aneurysm of aorta   . Aneurysm of abdominal aorta   . Stroke     CVA effected  eye  & leg  . Cataract, bilateral   . Renal disorder     Past Surgical History  Procedure Date  . Abdominal aortic aneurysm repair   . Hernia repair     Family History  Problem Relation Age of Onset  . Cancer Mother   . Diabetes Mother   . Heart failure Father   . Stroke Father    Social History:  reports that he has been smoking.  He does not have any smokeless tobacco history on file. He reports that he drinks alcohol. He reports that he does not use illicit drugs.  Allergies: No Known Allergies  Medications Prior to Admission  Medication Dose Route Frequency Provider Last Rate Last Dose  . 0.9 %  sodium chloride infusion   Intravenous Continuous Vida Roller, MD      . dextrose 5 %-0.45 % sodium chloride infusion   Intravenous Continuous Vida Roller, MD      . dextrose 50 % solution 25 mL  25 mL Intravenous PRN Vida Roller, MD      . insulin regular (NOVOLIN R,HUMULIN R) 1 Units/mL in sodium chloride 0.9 % 100 mL infusion    Intravenous To Major Vida Roller, MD      . insulin regular bolus via infusion 0-10 Units  0-10 Units Intravenous TID WC Vida Roller, MD      . sodium chloride 0.9 % bolus 1,000 mL  1,000 mL Intravenous Once Vida Roller, MD   1,000 mL at 12/11/11 0004  . sodium chloride 0.9 % bolus 1,000 mL  1,000 mL Intravenous Once Vida Roller, MD   1,000 mL at 12/11/11 0059   Medications Prior to Admission  Medication Sig Dispense Refill  . allopurinol (ZYLOPRIM) 100 MG tablet Take 100 mg by mouth daily.        . cloNIDine (CATAPRES) 0.2 MG tablet Take 0.2 mg by mouth 3 (three) times daily.       Marland Kitchen glipiZIDE (GLUCOTROL) 10 MG tablet Take 10 mg by mouth 2 (two) times daily before a meal.        . lisinopril (PRINIVIL,ZESTRIL) 20 MG tablet Take 20 mg by mouth 2 (two) times daily.       . salsalate (DISALCID) 500 MG tablet Take 500 mg by mouth 3 (three) times daily.       . simvastatin (ZOCOR) 20 MG tablet Take 20 mg by mouth at bedtime.          Results for orders  placed during the hospital encounter of 12/10/11 (from the past 48 hour(s))  GLUCOSE, CAPILLARY     Status: Abnormal   Collection Time   12/10/11 11:31 PM      Component Value Range Comment   Glucose-Capillary >600 (*) 70 - 99 (mg/dL)    Comment 1 Notify RN     CBC     Status: Abnormal   Collection Time   12/10/11 11:33 PM      Component Value Range Comment   WBC 14.5 (*) 4.0 - 10.5 (K/uL) WHITE COUNT CONFIRMED ON SMEAR   RBC 4.84  4.22 - 5.81 (MIL/uL)    Hemoglobin 14.9  13.0 - 17.0 (g/dL)    HCT 16.1  09.6 - 04.5 (%)    MCV 86.4  78.0 - 100.0 (fL)    MCH 30.8  26.0 - 34.0 (pg)    MCHC 35.6  30.0 - 36.0 (g/dL)    RDW 40.9  81.1 - 91.4 (%)    Platelets 149 (*) 150 - 400 (K/uL) PLATELET COUNT CONFIRMED BY SMEAR  DIFFERENTIAL     Status: Abnormal   Collection Time   12/10/11 11:33 PM      Component Value Range Comment   Neutrophils Relative 71  43 - 77 (%)    Lymphocytes Relative 22  12 - 46 (%)    Monocytes Relative 6  3 - 12  (%)    Eosinophils Relative 1  0 - 5 (%)    Basophils Relative 0  0 - 1 (%)    Neutro Abs 10.3 (*) 1.7 - 7.7 (K/uL)    Lymphs Abs 3.2  0.7 - 4.0 (K/uL)    Monocytes Absolute 0.9  0.1 - 1.0 (K/uL)    Eosinophils Absolute 0.1  0.0 - 0.7 (K/uL)    Basophils Absolute 0.0  0.0 - 0.1 (K/uL)    Smear Review LARGE PLATELETS PRESENT     COMPREHENSIVE METABOLIC PANEL     Status: Abnormal   Collection Time   12/10/11 11:39 PM      Component Value Range Comment   Sodium 129 (*) 135 - 145 (mEq/L)    Potassium 5.4 (*) 3.5 - 5.1 (mEq/L)    Chloride 94 (*) 96 - 112 (mEq/L)    CO2 14 (*) 19 - 32 (mEq/L)    Glucose, Bld 773 (*) 70 - 99 (mg/dL)    BUN 58 (*) 6 - 23 (mg/dL)    Creatinine, Ser 7.82 (*) 0.50 - 1.35 (mg/dL)    Calcium 95.6  8.4 - 10.5 (mg/dL)    Total Protein 8.3  6.0 - 8.3 (g/dL)    Albumin 3.7  3.5 - 5.2 (g/dL)    AST 14  0 - 37 (U/L)    ALT 11  0 - 53 (U/L)    Alkaline Phosphatase 102  39 - 117 (U/L)    Total Bilirubin 0.2 (*) 0.3 - 1.2 (mg/dL)    GFR calc non Af Amer 22 (*) >90 (mL/min)    GFR calc Af Amer 25 (*) >90 (mL/min)   POCT I-STAT, CHEM 8     Status: Abnormal   Collection Time   12/10/11 11:51 PM      Component Value Range Comment   Sodium 131 (*) 135 - 145 (mEq/L)    Potassium 5.4 (*) 3.5 - 5.1 (mEq/L)    Chloride 106  96 - 112 (mEq/L)    BUN 55 (*) 6 - 23 (mg/dL)    Creatinine, Ser 2.13 (*)  0.50 - 1.35 (mg/dL)    Glucose, Bld >147 (*) 70 - 99 (mg/dL)    Calcium, Ion 8.29  1.12 - 1.32 (mmol/L)    TCO2 20  0 - 100 (mmol/L)    Hemoglobin 16.0  13.0 - 17.0 (g/dL)    HCT 56.2  13.0 - 86.5 (%)    Comment NOTIFIED PHYSICIAN     URINALYSIS, ROUTINE W REFLEX MICROSCOPIC     Status: Abnormal   Collection Time   12/11/11 12:05 AM      Component Value Range Comment   Color, Urine YELLOW  YELLOW     APPearance CLEAR  CLEAR     Specific Gravity, Urine 1.026  1.005 - 1.030     pH 5.0  5.0 - 8.0     Glucose, UA >1000 (*) NEGATIVE (mg/dL)    Hgb urine dipstick SMALL (*)  NEGATIVE     Bilirubin Urine NEGATIVE  NEGATIVE     Ketones, ur 15 (*) NEGATIVE (mg/dL)    Protein, ur 784 (*) NEGATIVE (mg/dL)    Urobilinogen, UA 0.2  0.0 - 1.0 (mg/dL)    Nitrite NEGATIVE  NEGATIVE     Leukocytes, UA NEGATIVE  NEGATIVE    URINE MICROSCOPIC-ADD ON     Status: Normal   Collection Time   12/11/11 12:05 AM      Component Value Range Comment   Squamous Epithelial / LPF RARE  RARE     WBC, UA 0-2  <3 (WBC/hpf)    RBC / HPF 3-6  <3 (RBC/hpf)   GLUCOSE, CAPILLARY     Status: Abnormal   Collection Time   12/11/11  1:27 AM      Component Value Range Comment   Glucose-Capillary >600 (*) 70 - 99 (mg/dL)    Comment 1 Notify RN      Ct Head Wo Contrast  12/11/2011  *RADIOLOGY REPORT*  Clinical Data: Difficulty swallowing.  Hyperglycemia and hypertension with weakness for 12 days.  CT HEAD WITHOUT CONTRAST  Technique:  Contiguous axial images were obtained from the base of the skull through the vertex without contrast.  Comparison: 02/02/2008.  Findings: No mass lesion, mass effect, midline shift, hydrocephalus, hemorrhage.  No acute territorial cortical ischemia/infarct. Atrophy and chronic ischemic white matter disease is present.  Atrophy of the left cerebral peduncle is present associated with prior infarct.  Intracranial atherosclerosis.  The study is mildly degraded by motion artifact.  Slices were repeated. Calvarium appears intact.  Paranasal sinuses are within normal limits. Unchanged small bilateral basal ganglia lacunar infarcts.  IMPRESSION: No acute intracranial abnormality.  Atrophy and chronic ischemic white matter disease with bilateral basal ganglia lacunar infarcts.  Original Report Authenticated By: Andreas Newport, M.D.   Dg Chest Port 1 View  12/11/2011  *RADIOLOGY REPORT*  Clinical Data: Short of breath  PORTABLE CHEST - 1 VIEW  Comparison: Chest radiograph 02/21/2011  Findings: Stable enlarged heart silhouette with ectatic aorta. There is chronic linear scarring at the  lung bases.  Emphysematous change in the upper lobes.  No pneumothorax.  IMPRESSION:  1.  No clear acute cardiopulmonary process.  2.   Chronic scarring at the lung bases. 3.  Emphysematous change.  Original Report Authenticated By: Genevive Bi, M.D.    Review of Systems  Constitutional: Negative.   HENT: Negative.   Eyes: Negative.   Respiratory: Negative.   Cardiovascular: Negative.   Gastrointestinal:       Difficulty swallowing.  Genitourinary: Positive for frequency.  Musculoskeletal: Negative.  Skin: Negative.   Neurological: Negative.   Endo/Heme/Allergies: Negative.   Psychiatric/Behavioral: Negative.     Blood pressure 163/80, pulse 63, temperature 97.9 F (36.6 C), temperature source Oral, resp. rate 18, SpO2 95.00%. Physical Exam  Constitutional: He is oriented to person, place, and time. He appears well-developed and well-nourished. No distress.  HENT:  Head: Normocephalic and atraumatic.  Right Ear: External ear normal.  Left Ear: External ear normal.  Nose: Nose normal.  Mouth/Throat: Oropharynx is clear and moist. No oropharyngeal exudate.  Eyes: Conjunctivae and EOM are normal. Pupils are equal, round, and reactive to light. Right eye exhibits no discharge. Left eye exhibits no discharge. No scleral icterus.  Neck: Normal range of motion. Neck supple.  Cardiovascular: Normal rate, regular rhythm and normal heart sounds.   Respiratory: Effort normal and breath sounds normal. No respiratory distress. He has no wheezes. He has no rales.  GI: Soft. Bowel sounds are normal. He exhibits no distension. There is no tenderness. There is no rebound.  Musculoskeletal: Normal range of motion. He exhibits no edema and no tenderness.  Neurological: He is alert and oriented to person, place, and time.       Move all limbs.  Skin: Skin is warm and dry. No rash noted. He is not diaphoretic. No erythema.  Psychiatric: His behavior is normal.     Assessment/Plan #1.  Diabetic ketoacidosis - patient has been started on IV insulin drip and IV fluids. We will follow DKA protocol with frequent metabolic panel checks and insulin IV insulin until anion gap corrected and slowly transition to Lantus. #2. Difficulty swallowing - patient has difficulty swallowing and it is at the oropharynx. I do not know if it is due to dehydration causing dryness but given his history of previous strokes we have ordered CT head without contrast, we'll get a swallow evaluation and also MRI brain. #3. Acute renal failure on chronic kidney disease - this is probably secondary to dehydration. His baseline creatinine is probably around 1.8. We'll hold off ACE inhibitors for now until his creatinine improves. Follow metabolic panel and intake output closely. #4. Tobacco abuse - will need tobacco cessation counseling. #5. History of abdominal aortic aneurysm status post repair and history of thoracic aortic aneurysm - presently has no chest pain or abdominal pain. #6. Hypertension - continue present medications except for ACE inhibitors until creatinine improves and patient be placed on when necessary IV labetalol.  CODE STATUS - full code.  Sydne Krahl N. 12/11/2011, 2:40 AM

## 2011-12-11 NOTE — Progress Notes (Signed)
I have seen and assesssed patient and agree with Dr Katherene Ponto assessment and plan.

## 2011-12-12 ENCOUNTER — Inpatient Hospital Stay (HOSPITAL_COMMUNITY): Payer: Medicare Other

## 2011-12-12 LAB — DIFFERENTIAL
Basophils Relative: 0 % (ref 0–1)
Eosinophils Relative: 3 % (ref 0–5)
Lymphocytes Relative: 33 % (ref 12–46)
Neutro Abs: 7.6 10*3/uL (ref 1.7–7.7)
Neutrophils Relative %: 58 % (ref 43–77)

## 2011-12-12 LAB — CBC
HCT: 30.9 % — ABNORMAL LOW (ref 39.0–52.0)
Hemoglobin: 10.9 g/dL — ABNORMAL LOW (ref 13.0–17.0)
MCH: 29.7 pg (ref 26.0–34.0)
MCHC: 35.3 g/dL (ref 30.0–36.0)
RBC: 3.67 MIL/uL — ABNORMAL LOW (ref 4.22–5.81)

## 2011-12-12 LAB — GLUCOSE, CAPILLARY
Glucose-Capillary: 178 mg/dL — ABNORMAL HIGH (ref 70–99)
Glucose-Capillary: 327 mg/dL — ABNORMAL HIGH (ref 70–99)
Glucose-Capillary: 219 mg/dL — ABNORMAL HIGH (ref 70–99)

## 2011-12-12 LAB — BASIC METABOLIC PANEL
BUN: 37 mg/dL — ABNORMAL HIGH (ref 6–23)
CO2: 19 mEq/L (ref 19–32)
Calcium: 8.3 mg/dL — ABNORMAL LOW (ref 8.4–10.5)
Chloride: 107 mEq/L (ref 96–112)
GFR calc Af Amer: 30 mL/min — ABNORMAL LOW (ref >90)
GFR calc non Af Amer: 26 mL/min — ABNORMAL LOW (ref >90)
Glucose, Bld: 76 mg/dL (ref 70–99)
Glucose, Bld: 229 mg/dL — ABNORMAL HIGH (ref 70–99)
Potassium: 3.7 mEq/L (ref 3.5–5.1)
Potassium: 3.9 mEq/L (ref 3.5–5.1)
Sodium: 137 mEq/L (ref 135–145)
Sodium: 136 mEq/L (ref 135–145)

## 2011-12-12 LAB — SODIUM, URINE, RANDOM: Sodium, Ur: 45 mEq/L

## 2011-12-12 LAB — CREATININE, URINE, RANDOM: Creatinine, Urine: 192.97 mg/dL

## 2011-12-12 MED ORDER — ROSUVASTATIN CALCIUM 5 MG PO TABS
5.0000 mg | ORAL_TABLET | Freq: Every day | ORAL | Status: DC
  Administered 2011-12-12 – 2011-12-15 (×3): 5 mg via ORAL
  Filled 2011-12-12 (×6): qty 1

## 2011-12-12 MED ORDER — INSULIN ASPART 100 UNIT/ML ~~LOC~~ SOLN
0.0000 [IU] | Freq: Every day | SUBCUTANEOUS | Status: DC
  Administered 2011-12-12: 2 [IU] via SUBCUTANEOUS

## 2011-12-12 MED ORDER — INSULIN GLARGINE 100 UNIT/ML ~~LOC~~ SOLN
18.0000 [IU] | Freq: Every day | SUBCUTANEOUS | Status: DC
  Administered 2011-12-12: 18 [IU] via SUBCUTANEOUS

## 2011-12-12 MED ORDER — INSULIN ASPART 100 UNIT/ML ~~LOC~~ SOLN
0.0000 [IU] | Freq: Three times a day (TID) | SUBCUTANEOUS | Status: DC
  Administered 2011-12-12: 20 [IU] via SUBCUTANEOUS
  Administered 2011-12-12: 11 [IU] via SUBCUTANEOUS
  Administered 2011-12-13 (×2): 7 [IU] via SUBCUTANEOUS
  Administered 2011-12-13: 4 [IU] via SUBCUTANEOUS

## 2011-12-12 MED ORDER — INSULIN ASPART 100 UNIT/ML ~~LOC~~ SOLN
3.0000 [IU] | Freq: Once | SUBCUTANEOUS | Status: AC
  Administered 2011-12-12: 3 [IU] via SUBCUTANEOUS

## 2011-12-12 NOTE — Progress Notes (Signed)
Subjective: Pt denies CP, no SOB, also denies n/v Objective: Vital signs in last 24 hours: Temp:  [98 F (36.7 C)-98.6 F (37 C)] 98.6 F (37 C) (01/09 0900) Pulse Rate:  [61-86] 68  (01/09 0900) Resp:  [18-24] 18  (01/09 0900) BP: (130-147)/(69-83) 147/83 mmHg (01/09 0900) SpO2:  [96 %-99 %] 96 % (01/09 0900) Last BM Date: 12/11/11 Intake/Output from previous day: 01/08 0701 - 01/09 0700 In: 600 [P.O.:600] Out: 1500 [Urine:1500] Intake/Output this shift:      General Appearance:    Alert, cooperative, no distress, appears stated age  Lungs:     Clear to auscultation bilaterally, respirations unlabored   Heart:    Regular rate and rhythm, S1 and S2 normal, no murmur, rub   or gallop  Abdomen:     Soft, non-tender, bowel sounds active all four quadrants,    no masses, no organomegaly  Extremities:   Extremities normal, atraumatic, no cyanosis or edema  Neurologic:   CNII-XII intact, normal strength, sensation and reflexes    throughout    Weight change:   Intake/Output Summary (Last 24 hours) at 12/12/11 1209 Last data filed at 12/12/11 0500  Gross per 24 hour  Intake      0 ml  Output   1500 ml  Net  -1500 ml    Lab Results:   Basename 12/12/11 0530 12/11/11 2313  NA 136 137  K 3.9 3.7  CL 107 108  CO2 17* 19  GLUCOSE 229* 76  BUN 37* 40*  CREATININE 2.50* 2.56*  CALCIUM 8.3* 8.3*    Basename 12/12/11 0530 12/11/11 0625  WBC 13.1* 15.7*  HGB 10.9* 13.1  HCT 30.9* 37.0*  PLT 131* 146*  MCV 84.2 84.3   PT/INR No results found for this basename: LABPROT:2,INR:2 in the last 72 hours ABG  Basename 12/11/11 0450  PHART 7.369  HCO3 19.0*    Micro Results: Recent Results (from the past 240 hour(s))  CULTURE, BLOOD (ROUTINE X 2)     Status: Normal (Preliminary result)   Collection Time   12/11/11  1:23 PM      Component Value Range Status Comment   Specimen Description BLOOD LEFT ANTECUBITAL   Final    Special Requests BOTTLES DRAWN AEROBIC AND  ANAEROBIC 10CC   Final    Setup Time [id number]   Final    Culture     Final    Value:        BLOOD CULTURE RECEIVED NO GROWTH TO DATE CULTURE WILL BE HELD FOR 5 DAYS BEFORE ISSUING A FINAL NEGATIVE REPORT   Report Status PENDING   Incomplete   CULTURE, BLOOD (ROUTINE X 2)     Status: Normal (Preliminary result)   Collection Time   12/11/11  1:29 PM      Component Value Range Status Comment   Specimen Description BLOOD LEFT HAND   Final    Special Requests BOTTLES DRAWN AEROBIC AND ANAEROBIC 10CC   Final    Setup Time [id number]   Final    Culture     Final    Value:        BLOOD CULTURE RECEIVED NO GROWTH TO DATE CULTURE WILL BE HELD FOR 5 DAYS BEFORE ISSUING A FINAL NEGATIVE REPORT   Report Status PENDING   Incomplete    Studies/Results: Ct Head Wo Contrast  12/11/2011  *RADIOLOGY REPORT*  Clinical Data: Difficulty swallowing.  Hyperglycemia and hypertension with weakness for 12 days.  CT HEAD WITHOUT CONTRAST  Technique:  Contiguous axial images were obtained from the base of the skull through the vertex without contrast.  Comparison: 02/02/2008.  Findings: No mass lesion, mass effect, midline shift, hydrocephalus, hemorrhage.  No acute territorial cortical ischemia/infarct. Atrophy and chronic ischemic white matter disease is present.  Atrophy of the left cerebral peduncle is present associated with prior infarct.  Intracranial atherosclerosis.  The study is mildly degraded by motion artifact.  Slices were repeated. Calvarium appears intact.  Paranasal sinuses are within normal limits. Unchanged small bilateral basal ganglia lacunar infarcts.  IMPRESSION: No acute intracranial abnormality.  Atrophy and chronic ischemic white matter disease with bilateral basal ganglia lacunar infarcts.  Original Report Authenticated By: Andreas Newport, M.D.   Mr Brain Wo Contrast  12/12/2011  *RADIOLOGY REPORT*  Clinical Data: Weakness and difficulty swallowing.  Rule out CVA.  MRI HEAD WITHOUT CONTRAST   Technique:  Multiplanar, multiecho pulse sequences of the brain and surrounding structures were obtained according to standard protocol without intravenous contrast.  Comparison: CT 12/11/2011  Findings: Negative for acute infarct.  Extensive chronic microvascular ischemia throughout the periventricular and deep cerebral white matter bilaterally.  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 Negative for mass or fluid collection.  There is generalized atrophy with prominence of the ventricles and subarachnoid space.  Paranasal sinuses are clear.  IMPRESSION: Atrophy and extensive chronic ischemic change.  No acute abnormality.  Original Report Authenticated By: Camelia Phenes, M.D.   US Renal  12/12/2011  *RADIOLOGY REPORT*  Clinical Data:  Renal insufficiency.  Hypertension.  Previous abdominal aortic aneurysm repair.  RENAL/URINARY TRACT ULTRASOUND COMPLETE  Comparison:  None.  Findings:  Right Kidney:  Small in size, measuring 7.3 cm in length.  Normal parenchymal echogenicity.  No evidence of renal mass or hydronephrosis.  Left Kidney:  Normal in size and parenchymal echogenicity. Measures 10.4 cm in length.  No evidence of renal mass or hydronephrosis.  Bladder:  Appears normal for degree of bladder distention.  IMPRESSION:  1.  No evidence of hydronephrosis. 2.  Small right kidney measuring 7.3 cm in length.  No evidence of renal mass.  Original Report Authenticated By: Danae Orleans, M.D.   Dg Chest Port 1 View  12/11/2011  *RADIOLOGY REPORT*  Clinical Data: Short of breath  PORTABLE CHEST - 1 VIEW  Comparison: Chest radiograph 02/21/2011  Findings: Stable enlarged heart silhouette with ectatic aorta. There is chronic linear scarring at the lung bases.  Emphysematous change in the upper lobes.  No pneumothorax.  IMPRESSION:  1.  No clear acute cardiopulmonary process.  2.   Chronic scarring at the lung bases. 3.  Emphysematous change.  Original Report  Authenticated By: Genevive Bi, M.D.   Medications:  Scheduled Meds:   . bd getting started take home kit  1 kit Other Once  . cloNIDine  0.2 mg Oral TID  . diltiazem  360 mg Oral Daily  . insulin aspart  0-20 Units Subcutaneous TID WC  . insulin aspart  0-5 Units Subcutaneous QHS  . insulin aspart  3 Units Subcutaneous Once  . insulin glargine  10 Units Subcutaneous Once  . insulin glargine  18 Units Subcutaneous QHS  . labetalol  100 mg Oral BID  . living well with diabetes book   Does not apply Once  . nicotine  21 mg Transdermal Daily  . simvastatin  20 mg Oral QHS  . DISCONTD: bd getting started take home kit  1 kit Other Once  . DISCONTD: insulin aspart  0-15 Units Subcutaneous TID WC  . DISCONTD: insulin glargine  10 Units Subcutaneous Daily  . DISCONTD: insulin glargine  10 Units Subcutaneous QHS   Continuous Infusions:   . sodium chloride 125 mL/hr at 12/11/11 1945  . DISCONTD: sodium chloride    . DISCONTD: dextrose 5 % and 0.45% NaCl 125 mL/hr at 12/11/11 1912  . DISCONTD: insulin (NOVOLIN-R) infusion Stopped (12/11/11 2230)   PRN Meds:.dextrose, labetalol Assessment/Plan: Patient Active Hospital Problem List: DKA/uncontrolled (diabetic ketoacidoses) (12/11/2011) - Pt now off glucostabilizer,elevated BG this am - will increase lantus, and  SSI.   HYPERTENSION (11/20/2006) -continue outpt meds Acute on CKD Improving with hydration, follow ansd recheck  ABDOMINAL AORTIC ANEURYSM REPAIR, HX OF (11/20/2006)   Hyperlipidemia (12/11/2011)     LOS: 2 days   Lestine Rahe C 12/12/2011, 12:09 PM

## 2011-12-12 NOTE — Progress Notes (Signed)
Patient is on Simvastatin 20mg  daily at home which is being resumed on admission. Patient is also on diltiazem 360mg  daily which is also one of his home medication. diltiazem and simvastatin >10 mg/day have reported cases of rhabdomyolysis  Plan: I switched simvastatin to rosuvastatin 5mg  daily to avoid drug-drug interaction. When discharge patient home, please consider switching patient home statin to rosuvastatin 5mg  daily or pravastatin 40mg  daily.  Thanks.

## 2011-12-12 NOTE — Progress Notes (Signed)
Inpatient Diabetes Program Recommendations  AACE/ADA: New Consensus Statement on Inpatient Glycemic Control (2009)  Target Ranges:  Prepandial:   less than 140 mg/dL      Peak postprandial:   less than 180 mg/dL (1-2 hours)      Critically ill patients:  140 - 180 mg/dL   Reason for Visit: Note fasting lab glucose=229 mg/dL.  Will likely need more basal insulin and the addition of Novolog meal coverage.  Inpatient Diabetes Program Recommendations Insulin - Basal: Increase Lantus to 20 units daily. Insulin - Meal Coverage: Consider adding Novolog 4 units tid with meals. HgbA1C: A1c=10.8% indicating average glucose 264 mg/dL.  Note:

## 2011-12-12 NOTE — Progress Notes (Signed)
Patient had 9 beats of Vtach at 0135 this am. Patient's vitals are stable and he was sleeping comfortably. Patient is asymptomatic. MD notified and will continue to monitor. Patient is currently in normal sinus rhythm.  Harless Litten, RN 12/11/11

## 2011-12-12 NOTE — Progress Notes (Signed)
Discussed with patient and sister the possible need for insulin once patient is discharged home.  A1C discussed.  Patient states he thinks he would be able to use insulin pen (he has Medicaid which will cover insulin pen/needles).  Briefly demonstrated use to patient including air shot, and need to hold for 6-10 seconds with each injection.  Discussed with RN.  She will allow patient to self-administer insulin tonight. Will follow.

## 2011-12-13 ENCOUNTER — Inpatient Hospital Stay (HOSPITAL_COMMUNITY): Payer: Medicare Other

## 2011-12-13 LAB — CBC
HCT: 32.5 % — ABNORMAL LOW (ref 39.0–52.0)
Hemoglobin: 11.3 g/dL — ABNORMAL LOW (ref 13.0–17.0)
MCH: 29.7 pg (ref 26.0–34.0)
MCHC: 34.8 g/dL (ref 30.0–36.0)
MCV: 85.3 fL (ref 78.0–100.0)
RDW: 14.6 % (ref 11.5–15.5)

## 2011-12-13 LAB — URINE CULTURE: Culture: NO GROWTH

## 2011-12-13 LAB — BASIC METABOLIC PANEL
BUN: 27 mg/dL — ABNORMAL HIGH (ref 6–23)
Calcium: 8.3 mg/dL — ABNORMAL LOW (ref 8.4–10.5)
Creatinine, Ser: 2.17 mg/dL — ABNORMAL HIGH (ref 0.50–1.35)
GFR calc non Af Amer: 31 mL/min — ABNORMAL LOW (ref >90)
Glucose, Bld: 224 mg/dL — ABNORMAL HIGH (ref 70–99)

## 2011-12-13 LAB — URINE MICROSCOPIC-ADD ON

## 2011-12-13 LAB — URINALYSIS, ROUTINE W REFLEX MICROSCOPIC
Nitrite: NEGATIVE
Protein, ur: 100 mg/dL — AB
Urobilinogen, UA: 0.2 mg/dL (ref 0.0–1.0)

## 2011-12-13 LAB — LIPASE, BLOOD: Lipase: 80 U/L — ABNORMAL HIGH (ref 11–59)

## 2011-12-13 LAB — GLUCOSE, CAPILLARY: Glucose-Capillary: 221 mg/dL — ABNORMAL HIGH (ref 70–99)

## 2011-12-13 MED ORDER — MORPHINE SULFATE 2 MG/ML IJ SOLN
1.0000 mg | INTRAMUSCULAR | Status: DC | PRN
  Administered 2011-12-13 – 2011-12-24 (×5): 1 mg via INTRAVENOUS
  Filled 2011-12-13 (×5): qty 1

## 2011-12-13 MED ORDER — ONDANSETRON HCL 4 MG/2ML IJ SOLN
4.0000 mg | Freq: Four times a day (QID) | INTRAMUSCULAR | Status: DC | PRN
  Administered 2011-12-13 – 2011-12-22 (×8): 4 mg via INTRAVENOUS
  Filled 2011-12-13 (×8): qty 2

## 2011-12-13 MED ORDER — HYDRALAZINE HCL 20 MG/ML IJ SOLN
10.0000 mg | Freq: Four times a day (QID) | INTRAMUSCULAR | Status: DC | PRN
  Administered 2011-12-13: 10 mg via INTRAVENOUS
  Filled 2011-12-13: qty 1

## 2011-12-13 MED ORDER — DILTIAZEM HCL ER BEADS 360 MG PO CP24
360.0000 mg | ORAL_CAPSULE | Freq: Every day | ORAL | Status: DC
  Administered 2011-12-14: 360 mg via ORAL
  Filled 2011-12-13 (×2): qty 1

## 2011-12-13 MED ORDER — HYDRALAZINE HCL 20 MG/ML IJ SOLN
20.0000 mg | Freq: Four times a day (QID) | INTRAMUSCULAR | Status: DC | PRN
  Administered 2011-12-13: 10 mg via INTRAVENOUS
  Administered 2011-12-14 – 2011-12-15 (×5): 20 mg via INTRAVENOUS
  Filled 2011-12-13 (×6): qty 1

## 2011-12-13 MED ORDER — CLONIDINE HCL 0.3 MG/24HR TD PTWK
0.3000 mg | MEDICATED_PATCH | TRANSDERMAL | Status: DC
  Administered 2011-12-13: 0.3 mg via TRANSDERMAL
  Filled 2011-12-13 (×2): qty 1

## 2011-12-13 MED ORDER — LABETALOL HCL 5 MG/ML IV SOLN
10.0000 mg | INTRAVENOUS | Status: DC | PRN
  Administered 2011-12-14 (×2): 10 mg via INTRAVENOUS
  Filled 2011-12-13 (×3): qty 4

## 2011-12-13 MED ORDER — INSULIN ASPART 100 UNIT/ML ~~LOC~~ SOLN
0.0000 [IU] | SUBCUTANEOUS | Status: DC
  Administered 2011-12-14: 5 [IU] via SUBCUTANEOUS
  Administered 2011-12-14 – 2011-12-16 (×13): 3 [IU] via SUBCUTANEOUS
  Administered 2011-12-16 – 2011-12-17 (×5): 2 [IU] via SUBCUTANEOUS
  Administered 2011-12-18: 5 [IU] via SUBCUTANEOUS
  Administered 2011-12-18: 2 [IU] via SUBCUTANEOUS
  Administered 2011-12-18: 20:00:00 5 [IU] via SUBCUTANEOUS
  Administered 2011-12-18: 8 [IU] via SUBCUTANEOUS
  Administered 2011-12-18: 3 [IU] via SUBCUTANEOUS
  Administered 2011-12-19: 5 [IU] via SUBCUTANEOUS
  Administered 2011-12-19 – 2011-12-20 (×4): 2 [IU] via SUBCUTANEOUS
  Administered 2011-12-20 (×2): 3 [IU] via SUBCUTANEOUS
  Filled 2011-12-13 (×2): qty 3

## 2011-12-13 MED ORDER — PANTOPRAZOLE SODIUM 40 MG IV SOLR
40.0000 mg | INTRAVENOUS | Status: DC
  Administered 2011-12-13 – 2011-12-19 (×7): 40 mg via INTRAVENOUS
  Filled 2011-12-13 (×7): qty 40

## 2011-12-13 MED ORDER — INSULIN GLARGINE 100 UNIT/ML ~~LOC~~ SOLN
8.0000 [IU] | Freq: Every day | SUBCUTANEOUS | Status: DC
  Administered 2011-12-13 – 2011-12-18 (×6): 8 [IU] via SUBCUTANEOUS
  Filled 2011-12-13: qty 3

## 2011-12-13 NOTE — Progress Notes (Signed)
Subjective: Pt c/o abd pain this am- diffuse, denies n/v, also denies diarrhea Objective: Vital signs in last 24 hours: Temp:  [97.8 F (36.6 C)-98.8 F (37.1 C)] 97.8 F (36.6 C) (01/10 0400) Pulse Rate:  [59-74] 59  (01/10 0400) Resp:  [16-24] 16  (01/10 0400) BP: (129-158)/(72-84) 147/78 mmHg (01/10 0400) SpO2:  [93 %-98 %] 98 % (01/10 0400) Weight:  [83.2 kg (183 lb 6.8 oz)] 83.2 kg (183 lb 6.8 oz) (01/10 0400) Last BM Date: 12/12/11 Intake/Output from previous day: 01/09 0701 - 01/10 0700 In: 480 [P.O.:480] Out: 2450 [Urine:2450] Intake/Output this shift:      General Appearance:    Alert, cooperative, no distress, appears stated age  Lungs:     Clear to auscultation bilaterally, respirations unlabored   Heart:    Regular rate and rhythm, S1 and S2 normal, no murmur, rub   or gallop  Abdomen:     Soft, mild diffuse tenderness, bowel sounds present  no masses palpable.  Extremities:   Extremities normal, atraumatic, no cyanosis or edema  Neurologic:   CNII-XII intact, nonfocal    Weight change:   Intake/Output Summary (Last 24 hours) at 12/13/11 0749 Last data filed at 12/13/11 0645  Gross per 24 hour  Intake    240 ml  Output   2450 ml  Net  -2210 ml    Lab Results:   [facility] 12/13/11 0540 12/12/11 0530  NA 134* 136  K 4.0 3.9  CL 108 107  CO2 19 17*  GLUCOSE 224* 229*  BUN 27* 37*  CREATININE 2.17* 2.50*  CALCIUM 8.3* 8.3*    Basename 12/13/11 0540 12/12/11 0530  WBC 10.8* 13.1*  HGB 11.3* 10.9*  HCT 32.5* 30.9*  PLT 128* 131*  MCV 85.3 84.2   PT/INR No results found for this basename: LABPROT:2,INR:2 in the last 72 hours ABG  Basename 12/11/11 0450  PHART 7.369  HCO3 19.0*    Micro Results: Recent Results (from the past 240 hour(s))  URINE CULTURE     Status: Normal   Collection Time   12/11/11 12:55 PM      Component Value Range Status Comment   Specimen Description URINE, CLEAN CATCH   Final    Special Requests NONE   Final    Setup Time [id number]   Final    Colony Count NO GROWTH   Final    Culture NO GROWTH   Final    Report Status 12/12/2011 FINAL   Final   CULTURE, BLOOD (ROUTINE X 2)     Status: Normal (Preliminary result)   Collection Time   12/11/11  1:23 PM      Component Value Range Status Comment   Specimen Description BLOOD LEFT ANTECUBITAL   Final    Special Requests BOTTLES DRAWN AEROBIC AND ANAEROBIC 10CC   Final    Setup Time [id number]   Final    Culture     Final    Value:        BLOOD CULTURE RECEIVED NO GROWTH TO DATE CULTURE WILL BE HELD FOR 5 DAYS BEFORE ISSUING A FINAL NEGATIVE REPORT   Report Status PENDING   Incomplete   CULTURE, BLOOD (ROUTINE X 2)     Status: Normal (Preliminary result)   Collection Time   12/11/11  1:29 PM      Component Value Range Status Comment   Specimen Description BLOOD LEFT HAND   Final    Special Requests BOTTLES DRAWN AEROBIC AND ANAEROBIC 10CC  Final    Setup Time [id number]   Final    Culture     Final    Value:        BLOOD CULTURE RECEIVED NO GROWTH TO DATE CULTURE WILL BE HELD FOR 5 DAYS BEFORE ISSUING A FINAL NEGATIVE REPORT   Report Status PENDING   Incomplete    Studies/Results: Mr Brain Wo Contrast  12/12/2011  *RADIOLOGY REPORT*  Clinical Data: Weakness and difficulty swallowing.  Rule out CVA.  MRI HEAD WITHOUT CONTRAST  Technique:  Multiplanar, multiecho pulse sequences of the brain and surrounding structures were obtained according to standard protocol without intravenous contrast.  Comparison: CT 12/11/2011  Findings: Negative for acute infarct.  Extensive chronic microvascular ischemia throughout the periventricular and deep cerebral white matter bilaterally.  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 Negative for mass or fluid collection.  There is generalized atrophy with prominence of the ventricles and subarachnoid space.  Paranasal sinuses are clear.  IMPRESSION: Atrophy and extensive  chronic ischemic change.  No acute abnormality.  Original Report Authenticated By: Camelia Phenes, M.D.   US Renal  12/12/2011  *RADIOLOGY REPORT*  Clinical Data:  Renal insufficiency.  Hypertension.  Previous abdominal aortic aneurysm repair.  RENAL/URINARY TRACT ULTRASOUND COMPLETE  Comparison:  None.  Findings:  Right Kidney:  Small in size, measuring 7.3 cm in length.  Normal parenchymal echogenicity.  No evidence of renal mass or hydronephrosis.  Left Kidney:  Normal in size and parenchymal echogenicity. Measures 10.4 cm in length.  No evidence of renal mass or hydronephrosis.  Bladder:  Appears normal for degree of bladder distention.  IMPRESSION:  1.  No evidence of hydronephrosis. 2.  Small right kidney measuring 7.3 cm in length.  No evidence of renal mass.  Original Report Authenticated By: Danae Orleans, M.D.   Medications:  Scheduled Meds:    . bd getting started take home kit  1 kit Other Once  . cloNIDine  0.2 mg Oral TID  . diltiazem  360 mg Oral Daily  . insulin aspart  0-20 Units Subcutaneous TID WC  . insulin aspart  0-5 Units Subcutaneous QHS  . insulin aspart  3 Units Subcutaneous Once  . insulin glargine  18 Units Subcutaneous QHS  . labetalol  100 mg Oral BID  . living well with diabetes book   Does not apply Once  . nicotine  21 mg Transdermal Daily  . rosuvastatin  5 mg Oral q1800  . DISCONTD: insulin aspart  0-15 Units Subcutaneous TID WC  . DISCONTD: insulin glargine  10 Units Subcutaneous QHS  . DISCONTD: simvastatin  20 mg Oral QHS   Continuous Infusions:    . sodium chloride 125 mL/hr at 12/12/11 2324   PRN Meds:.dextrose, labetalol Assessment/Plan: Patient Active Hospital Problem List: Abdominal pain - obtain abd. X-ray, also lipase, UA -pain management, follow studies and further manage accordingly  DKA/uncontrolled (diabetic ketoacidoses) (12/11/2011) - continue lantus, SSI HYPERTENSION (11/20/2006) -continue cuurrent meds Acute on CKD Gradual  improvement with hydration, cr 1.4-1.6 about a year ago.  ABDOMINAL AORTIC ANEURYSM REPAIR, HX OF (11/20/2006)   Hyperlipidemia (12/11/2011)     LOS: 3 days   Geisha Abernathy C 12/13/2011, 7:49 AM

## 2011-12-14 ENCOUNTER — Inpatient Hospital Stay (HOSPITAL_COMMUNITY): Payer: Medicare Other

## 2011-12-14 LAB — BASIC METABOLIC PANEL
CO2: 20 mEq/L (ref 19–32)
Calcium: 8.7 mg/dL (ref 8.4–10.5)
Creatinine, Ser: 1.83 mg/dL — ABNORMAL HIGH (ref 0.50–1.35)
Glucose, Bld: 156 mg/dL — ABNORMAL HIGH (ref 70–99)

## 2011-12-14 LAB — CBC
MCH: 30.1 pg (ref 26.0–34.0)
MCV: 85.1 fL (ref 78.0–100.0)
Platelets: 159 10*3/uL (ref 150–400)
RDW: 14.8 % (ref 11.5–15.5)

## 2011-12-14 LAB — GLUCOSE, CAPILLARY
Glucose-Capillary: 170 mg/dL — ABNORMAL HIGH (ref 70–99)
Glucose-Capillary: 186 mg/dL — ABNORMAL HIGH (ref 70–99)
Glucose-Capillary: 201 mg/dL — ABNORMAL HIGH (ref 70–99)

## 2011-12-14 MED ORDER — METOPROLOL TARTRATE 1 MG/ML IV SOLN
5.0000 mg | Freq: Three times a day (TID) | INTRAVENOUS | Status: DC
  Administered 2011-12-14 – 2011-12-15 (×4): 5 mg via INTRAVENOUS
  Filled 2011-12-14 (×6): qty 5

## 2011-12-14 MED FILL — Diltiazem HCl Coated Beads Cap ER 24HR 360 MG: ORAL | Qty: 1 | Status: AC

## 2011-12-14 NOTE — Progress Notes (Signed)
Subjective: States still some abdominal pain but better, denies any further nausea vomiting. No Flatus Objective: Vital signs in last 24 hours: Temp:  [98.1 F (36.7 C)-98.8 F (37.1 C)] 98.3 F (36.8 C) (01/11 1600) Pulse Rate:  [75-107] 88  (01/11 1600) Resp:  [20] 20  (01/11 1600) BP: (160-222)/(93-120) 160/98 mmHg (01/11 1728) SpO2:  [92 %-99 %] 99 % (01/11 1600) Last BM Date: 12/13/11 Intake/Output from previous day: 01/10 0701 - 01/11 0700 In: 360 [P.O.:360] Out: 1655 [Urine:1655] Intake/Output this shift: Total I/O In: -  Out: 400 [Urine:400]    General Appearance:    Alert, cooperative, no distress, appears stated age  Lungs:     Clear to auscultation bilaterally, respirations unlabored   Heart:    Regular rate and rhythm, S1 and S2 normal, no murmur, rub   or gallop  Abdomen:     Soft, mild diffuse tenderness, decreased bowel sounds, no masses palpable.no rebound tenderness.  Extremities:   Extremities normal, atraumatic, no cyanosis or edema  Neurologic:   CNII-XII intact, nonfocal    Weight change:   Intake/Output Summary (Last 24 hours) at 12/14/11 1825 Last data filed at 12/14/11 1100  Gross per 24 hour  Intake      0 ml  Output   1430 ml  Net  -1430 ml    Lab Results:   [facility] 12/14/11 0657 12/13/11 0540  NA 141 134*  K 3.8 4.0  CL 110 108  CO2 20 19  GLUCOSE 156* 224*  BUN 16 27*  CREATININE 1.83* 2.17*  CALCIUM 8.7 8.3*    Basename 12/14/11 0657 12/13/11 0540  WBC 12.2* 10.8*  HGB 13.7 11.3*  HCT 38.7* 32.5*  PLT 159 128*  MCV 85.1 85.3   PT/INR No results found for this basename: LABPROT:2,INR:2 in the last 72 hours ABG No results found for this basename: PHART:2,PCO2:2,PO2:2,HCO3:2 in the last 72 hours  Micro Results: Recent Results (from the past 240 hour(s))  URINE CULTURE     Status: Normal   Collection Time   12/11/11 12:55 PM      Component Value Range Status Comment   Specimen Description URINE, CLEAN CATCH   Final    Special Requests NONE   Final    Setup Time [id number]   Final    Colony Count NO GROWTH   Final    Culture NO GROWTH   Final    Report Status 12/12/2011 FINAL   Final   CULTURE, BLOOD (ROUTINE X 2)     Status: Normal (Preliminary result)   Collection Time   12/11/11  1:23 PM      Component Value Range Status Comment   Specimen Description BLOOD LEFT ANTECUBITAL   Final    Special Requests BOTTLES DRAWN AEROBIC AND ANAEROBIC 10CC   Final    Setup Time [id number]   Final    Culture     Final    Value:        BLOOD CULTURE RECEIVED NO GROWTH TO DATE CULTURE WILL BE HELD FOR 5 DAYS BEFORE ISSUING A FINAL NEGATIVE REPORT   Report Status PENDING   Incomplete   CULTURE, BLOOD (ROUTINE X 2)     Status: Normal (Preliminary result)   Collection Time   12/11/11  1:29 PM      Component Value Range Status Comment   Specimen Description BLOOD LEFT HAND   Final    Special Requests BOTTLES DRAWN AEROBIC AND ANAEROBIC 10CC   Final  Setup Time [id number]   Final    Culture     Final    Value:        BLOOD CULTURE RECEIVED NO GROWTH TO DATE CULTURE WILL BE HELD FOR 5 DAYS BEFORE ISSUING A FINAL NEGATIVE REPORT   Report Status PENDING   Incomplete   URINE CULTURE     Status: Normal   Collection Time   12/13/11 12:55 PM      Component Value Range Status Comment   Specimen Description URINE, CLEAN CATCH   Final    Special Requests NONE   Final    Setup Time [id number]   Final    Colony Count NO GROWTH   Final    Culture NO GROWTH   Final    Report Status 12/14/2011 FINAL   Final    Studies/Results: Dg Chest Port 1 View  12/14/2011  *RADIOLOGY REPORT*  Clinical Data: Cough.  PORTABLE CHEST - 1 VIEW  Comparison: 01/07 08/22/2012.  Findings: Dilated tortuous aorta.  Degree of uncoiling appears slightly more prominent which may be related to decreased inspiration.  Increased markings lung bases some which appear chronic. Superimposed subtle infiltrate medial aspect of the lung bases would be  difficult to exclude on this portable exam.  Cardiomegaly.  No pulmonary edema or gross pneumothorax.  IMPRESSION: Dilated tortuous aorta.  Increased markings lung bases some which appear chronic. Superimposed subtle infiltrate medial aspect of the lung bases would be difficult to exclude on this portable exam.  Cardiomegaly.  Original Report Authenticated By: Fuller Canada, M.D.   Dg Abd 2 Views  12/14/2011  *RADIOLOGY REPORT*  Clinical Data: Follow up partial small bowel obstruction.  ABDOMEN - 2 VIEW  Comparison: 12/13/2011  Findings: Two views of the abdomen were obtained.  There is gas in the large and small bowel.  Persistent air fluid levels in the small bowel.  The degree of small bowel distention has not significantly changed.  Stool in the right colon.  No evidence to suggest free air. There may be small pleural effusions.  No acute bony abnormalities.  IMPRESSION: Stable appearance of the dilated small bowel loops.  The findings remain compatible with a partial small bowel obstruction and minimal change since the prior examination.  Suspect small pleural effusions.  Original Report Authenticated By: Richarda Overlie, M.D.   Dg Abd 2 Views  12/13/2011  *RADIOLOGY REPORT*  Clinical Data: Mid abdominal pain with nausea and vomiting  ABDOMEN - 2 VIEW  Comparison: None.  Findings: There are several mildly dilated loops of small bowel within the left upper quadrant with differential air-fluid levels. The colon contains stool and gas and is not dilated.  No pneumoperitoneum.  No gross organomegaly.  Surgical clips project over the mid lower abdomen.  IMPRESSION: There are several dilated small bowel loops with differential air- fluid levels in the left upper quadrant, worrisome for a partial mechanical small-bowel obstruction.  Please continue to follow closely.  No pneumoperitoneum.  Original Report Authenticated By: Brandon Melnick, M.D.   Medications:  Scheduled Meds:    . cloNIDine  0.3 mg Transdermal  Weekly  . insulin aspart  0-15 Units Subcutaneous Q4H  . insulin glargine  8 Units Subcutaneous QHS  . metoprolol  5 mg Intravenous Q8H  . nicotine  21 mg Transdermal Daily  . pantoprazole (PROTONIX) IV  40 mg Intravenous Q24H  . rosuvastatin  5 mg Oral q1800  . DISCONTD: cloNIDine  0.2 mg Oral TID  . DISCONTD:  diltiazem  360 mg Oral Daily  . DISCONTD: insulin aspart  0-20 Units Subcutaneous TID WC  . DISCONTD: insulin aspart  0-5 Units Subcutaneous QHS  . DISCONTD: labetalol  100 mg Oral BID   Continuous Infusions:    . sodium chloride 150 mL/hr at 12/14/11 1245   PRN Meds:.dextrose, hydrALAZINE, morphine injection, ondansetron (ZOFRAN) IV, DISCONTD: hydrALAZINE, DISCONTD: labetalol, DISCONTD: labetalol Assessment/Plan: Patient Active Hospital Problem List: Partial small bowel obstruction - Followup abdominal x-rays unchanged are, we'll keep n.p.o. continue conservative measures for today. Holding off NG for now as he had no further nausea or vomiting and abdominal pain better. Follow recheck x-rays in the morning if still no improvement  consult surgery. holding off CT scan of abdomen at this time with contrast due to patient's poor renal function.  DKA/uncontrolled (diabetic ketoacidoses) (12/11/2011) - continue lantus, SSI. Acidosis resolved. UNCONTROLLED HYPERTENSION (11/20/2006) Change to clonidine patch,  will add a scheduled iv metoprolol, when necessary hydralazine Acute on CKD Continued Gradual improvement with hydration, cr 1.4-1.6 about a year ago.  ABDOMINAL AORTIC ANEURYSM REPAIR, HX OF (11/20/2006)   Hyperlipidemia (12/11/2011)     LOS: 4 days   [name] C 12/14/2011, 6:25 PM

## 2011-12-14 NOTE — Progress Notes (Signed)
Utilization review complete 

## 2011-12-15 ENCOUNTER — Inpatient Hospital Stay (HOSPITAL_COMMUNITY): Payer: Medicare Other

## 2011-12-15 DIAGNOSIS — K56609 Unspecified intestinal obstruction, unspecified as to partial versus complete obstruction: Secondary | ICD-10-CM

## 2011-12-15 LAB — BASIC METABOLIC PANEL
BUN: 16 mg/dL (ref 6–23)
Calcium: 8.4 mg/dL (ref 8.4–10.5)
Chloride: 111 mEq/L (ref 96–112)
Creatinine, Ser: 1.96 mg/dL — ABNORMAL HIGH (ref 0.50–1.35)
GFR calc Af Amer: 40 mL/min — ABNORMAL LOW (ref >90)

## 2011-12-15 LAB — CBC
HCT: 34.6 % — ABNORMAL LOW (ref 39.0–52.0)
MCHC: 34.7 g/dL (ref 30.0–36.0)
MCV: 86.3 fL (ref 78.0–100.0)
Platelets: 149 10*3/uL — ABNORMAL LOW (ref 150–400)
RDW: 14.9 % (ref 11.5–15.5)

## 2011-12-15 LAB — GLUCOSE, CAPILLARY
Glucose-Capillary: 185 mg/dL — ABNORMAL HIGH (ref 70–99)
Glucose-Capillary: 176 mg/dL — ABNORMAL HIGH (ref 70–99)

## 2011-12-15 MED ORDER — METOPROLOL TARTRATE 1 MG/ML IV SOLN
5.0000 mg | Freq: Four times a day (QID) | INTRAVENOUS | Status: DC
  Administered 2011-12-15 – 2011-12-16 (×4): 5 mg via INTRAVENOUS
  Filled 2011-12-15 (×5): qty 5

## 2011-12-15 NOTE — Progress Notes (Signed)
Subjective: States still some abdominal lower abd pain, no further n/v, admits to flatus today, no bm Objective: Vital signs in last 24 hours: Temp:  [97.9 F (36.6 C)-99.7 F (37.6 C)] 97.9 F (36.6 C) (01/12 1300) Pulse Rate:  [79-111] 111  (01/12 1459) Resp:  [18-20] 18  (01/12 1300) BP: (169-202)/(81-110) 193/95 mmHg (01/12 1459) SpO2:  [95 %-98 %] 96 % (01/12 1300) Last BM Date: 12/13/11 Intake/Output from previous day: 01/11 0701 - 01/12 0700 In: -  Out: 1740 [Urine:1740] Intake/Output this shift: Total I/O In: 0  Out: 475 [Urine:475]    General Appearance:    Alert, cooperative, no distress, appears stated age  Lungs:     Clear to auscultation bilaterally, respirations unlabored   Heart:    Regular rate and rhythm, S1 and S2 normal, no murmur, rub   or gallop  Abdomen:     Soft, mild lower abd. tenderness,no rebound tenderness decreased bowel sounds, no masses palpable..  Extremities:   Extremities normal, atraumatic, no cyanosis or edema  Neurologic:   CNII-XII intact, nonfocal    Weight change:   Intake/Output Summary (Last 24 hours) at 12/15/11 1842 Last data filed at 12/15/11 1700  Gross per 24 hour  Intake      0 ml  Output   1815 ml  Net  -1815 ml    Lab Results:   Basename 12/15/11 0520 12/14/11 0657  NA 140 141  K 3.9 3.8  CL 111 110  CO2 19 20  GLUCOSE 171* 156*  BUN 16 16  CREATININE 1.96* 1.83*  CALCIUM 8.4 8.7    Basename 12/15/11 0520 12/14/11 0657  WBC 12.1* 12.2*  HGB 12.0* 13.7  HCT 34.6* 38.7*  PLT 149* 159  MCV 86.3 85.1   PT/INR No results found for this basename: LABPROT:2,INR:2 in the last 72 hours ABG No results found for this basename: PHART:2,PCO2:2,PO2:2,HCO3:2 in the last 72 hours  Micro Results: Recent Results (from the past 240 hour(s))  URINE CULTURE     Status: Normal   Collection Time   12/11/11 12:55 PM      Component Value Range Status Comment   Specimen Description URINE, CLEAN CATCH   Final    Special  Requests NONE   Final    Setup Time [id number]   Final    Colony Count NO GROWTH   Final    Culture NO GROWTH   Final    Report Status 12/12/2011 FINAL   Final   CULTURE, BLOOD (ROUTINE X 2)     Status: Normal (Preliminary result)   Collection Time   12/11/11  1:23 PM      Component Value Range Status Comment   Specimen Description BLOOD LEFT ANTECUBITAL   Final    Special Requests BOTTLES DRAWN AEROBIC AND ANAEROBIC 10CC   Final    Setup Time [id number]   Final    Culture     Final    Value:        BLOOD CULTURE RECEIVED NO GROWTH TO DATE CULTURE WILL BE HELD FOR 5 DAYS BEFORE ISSUING A FINAL NEGATIVE REPORT   Report Status PENDING   Incomplete   CULTURE, BLOOD (ROUTINE X 2)     Status: Normal (Preliminary result)   Collection Time   12/11/11  1:29 PM      Component Value Range Status Comment   Specimen Description BLOOD LEFT HAND   Final    Special Requests BOTTLES DRAWN AEROBIC AND ANAEROBIC 10CC  Final    Setup Time [id number]   Final    Culture     Final    Value:        BLOOD CULTURE RECEIVED NO GROWTH TO DATE CULTURE WILL BE HELD FOR 5 DAYS BEFORE ISSUING A FINAL NEGATIVE REPORT   Report Status PENDING   Incomplete   URINE CULTURE     Status: Normal   Collection Time   12/13/11 12:55 PM      Component Value Range Status Comment   Specimen Description URINE, CLEAN CATCH   Final    Special Requests NONE   Final    Setup Time [id number]   Final    Colony Count NO GROWTH   Final    Culture NO GROWTH   Final    Report Status 12/14/2011 FINAL   Final    Studies/Results: Dg Chest Port 1 View  12/14/2011  *RADIOLOGY REPORT*  Clinical Data: Cough.  PORTABLE CHEST - 1 VIEW  Comparison: 01/07 08/22/2012.  Findings: Dilated tortuous aorta.  Degree of uncoiling appears slightly more prominent which may be related to decreased inspiration.  Increased markings lung bases some which appear chronic. Superimposed subtle infiltrate medial aspect of the lung bases would be difficult to  exclude on this portable exam.  Cardiomegaly.  No pulmonary edema or gross pneumothorax.  IMPRESSION: Dilated tortuous aorta.  Increased markings lung bases some which appear chronic. Superimposed subtle infiltrate medial aspect of the lung bases would be difficult to exclude on this portable exam.  Cardiomegaly.  Original Report Authenticated By: Fuller Canada, M.D.   Dg Abd 2 Views  12/15/2011  *RADIOLOGY REPORT*  Clinical Data: Small bowel obstruction.  ABDOMEN - 2 VIEW  Comparison: Plain films of the abdomen and 12/13/2011 and 12/14/2011.  Findings: Short air fluid levels are again seen and small bowel. Dilated loops of small bowel are again identified measuring up to 5.6 cm.  There has been some increase in dilatation of small bowel loops in the left upper quadrant of the abdomen. No free intraperitoneal air is identified.  IMPRESSION: Mild worsening of small bowel obstruction.  Original Report Authenticated By: Bernadene Bell. D'ALESSIO, M.D.   Dg Abd 2 Views  12/14/2011  *RADIOLOGY REPORT*  Clinical Data: Follow up partial small bowel obstruction.  ABDOMEN - 2 VIEW  Comparison: 12/13/2011  Findings: Two views of the abdomen were obtained.  There is gas in the large and small bowel.  Persistent air fluid levels in the small bowel.  The degree of small bowel distention has not significantly changed.  Stool in the right colon.  No evidence to suggest free air. There may be small pleural effusions.  No acute bony abnormalities.  IMPRESSION: Stable appearance of the dilated small bowel loops.  The findings remain compatible with a partial small bowel obstruction and minimal change since the prior examination.  Suspect small pleural effusions.  Original Report Authenticated By: Richarda Overlie, M.D.   Medications:  Scheduled Meds:    . cloNIDine  0.3 mg Transdermal Weekly  . insulin aspart  0-15 Units Subcutaneous Q4H  . insulin glargine  8 Units Subcutaneous QHS  . metoprolol  5 mg Intravenous Q6H  .  nicotine  21 mg Transdermal Daily  . pantoprazole (PROTONIX) IV  40 mg Intravenous Q24H  . rosuvastatin  5 mg Oral q1800  . DISCONTD: metoprolol  5 mg Intravenous Q8H   Continuous Infusions:    . sodium chloride 150 mL/hr at 12/14/11 1914  PRN Meds:.dextrose, hydrALAZINE, morphine injection, ondansetron (ZOFRAN) IV Assessment/Plan: Patient Active Hospital Problem List: Partial small bowel obstruction - Followup abdominal x-rays today with mild worsening  will keep n.p.o. And place NGT - I have consulted surgery, Dr Dwain Sarna to see  -holding off CT scan of abdomen at this time with contrast due to patient's poor renal function.  DKA/uncontrolled (diabetic ketoacidoses) (12/11/2011) - continue lantus, SSI. Acidosis resolved. UNCONTROLLED HYPERTENSION (11/20/2006) Change to clonidine patch,  will increase iv metoprolol, continue when necessary IV hydralazine Acute on CKD Continue hydration and follow, cr 1.4-1.6 about a year ago.  ABDOMINAL AORTIC ANEURYSM REPAIR, HX OF (11/20/2006)   Hyperlipidemia (12/11/2011)     LOS: 5 days   Jordell Outten C 12/15/2011, 6:42 PM

## 2011-12-15 NOTE — Consult Note (Signed)
Reason for Consult:SBO Referring Physician: Dr. Nena Alexander is an 61 y.o. male.  HPI: 53 yom with multiple medical problems, known RIH admitted with DKA to medical service.  On CXR was noted to have dilated small bowel loops.  Follow up ab films show this to be present also.  He is passing flatus but not as much as usual.  He had a normal large BM Wednesday but nothing since then.  He states he has some discomfort right groin which he has occasionally.  He had episode of emesis two days ago but now does not complain of nausea or vomiting.  Consulted for evaluation for sbo.  Past Medical History  Diagnosis Date  . Diabetes mellitus   . Hypertension   . Aneurysm of aorta   . Aneurysm of abdominal aorta   . Stroke     CVA effected  eye  & leg  . Cataract, bilateral   . Renal disorder     Past Surgical History  Procedure Date  . Abdominal aortic aneurysm repair   . Hernia repair   appendectomy   Family History  Problem Relation Age of Onset  . Cancer Mother   . Diabetes Mother   . Heart failure Father   . Stroke Father     Social History:  reports that he has been smoking.  He does not have any smokeless tobacco history on file. He reports that he drinks alcohol. He reports that he does not use illicit drugs.  Allergies: No Known Allergies  Medications: I have reviewed the patient's current medications.  Results for orders placed during the hospital encounter of 12/10/11 (from the past 48 hour(s))  GLUCOSE, CAPILLARY     Status: Abnormal   Collection Time   12/14/11 12:00 AM      Component Value Range Comment   Glucose-Capillary 168 (*) 70 - 99 (mg/dL)   GLUCOSE, CAPILLARY     Status: Abnormal   Collection Time   12/14/11  4:39 AM      Component Value Range Comment   Glucose-Capillary 169 (*) 70 - 99 (mg/dL)   BASIC METABOLIC PANEL     Status: Abnormal   Collection Time   12/14/11  6:57 AM      Component Value Range Comment   Sodium 141  135 -  145 (mEq/L)    Potassium 3.8  3.5 - 5.1 (mEq/L)    Chloride 110  96 - 112 (mEq/L)    CO2 20  19 - 32 (mEq/L)    Glucose, Bld 156 (*) 70 - 99 (mg/dL)    BUN 16  6 - 23 (mg/dL)    Creatinine, Ser 5.62 (*) 0.50 - 1.35 (mg/dL)    Calcium 8.7  8.4 - 10.5 (mg/dL)    GFR calc non Af Amer 38 (*) >90 (mL/min)    GFR calc Af Amer 44 (*) >90 (mL/min)   CBC     Status: Abnormal   Collection Time   12/14/11  6:57 AM      Component Value Range Comment   WBC 12.2 (*) 4.0 - 10.5 (K/uL)    RBC 4.55  4.22 - 5.81 (MIL/uL)    Hemoglobin 13.7  13.0 - 17.0 (g/dL)    HCT 13.0 (*) 86.5 - 52.0 (%)    MCV 85.1  78.0 - 100.0 (fL)    MCH 30.1  26.0 - 34.0 (pg)    MCHC 35.4  30.0 - 36.0 (g/dL)  RDW 14.8  11.5 - 15.5 (%)    Platelets 159  150 - 400 (K/uL)   GLUCOSE, CAPILLARY     Status: Abnormal   Collection Time   12/14/11  7:57 AM      Component Value Range Comment   Glucose-Capillary 186 (*) 70 - 99 (mg/dL)   GLUCOSE, CAPILLARY     Status: Abnormal   Collection Time   12/14/11 11:28 AM      Component Value Range Comment   Glucose-Capillary 173 (*) 70 - 99 (mg/dL)   GLUCOSE, CAPILLARY     Status: Abnormal   Collection Time   12/14/11  5:13 PM      Component Value Range Comment   Glucose-Capillary 201 (*) 70 - 99 (mg/dL)    Comment 1 Notify RN     GLUCOSE, CAPILLARY     Status: Abnormal   Collection Time   12/14/11  9:55 PM      Component Value Range Comment   Glucose-Capillary 155 (*) 70 - 99 (mg/dL)   GLUCOSE, CAPILLARY     Status: Abnormal   Collection Time   12/14/11 11:55 PM      Component Value Range Comment   Glucose-Capillary 170 (*) 70 - 99 (mg/dL)   GLUCOSE, CAPILLARY     Status: Abnormal   Collection Time   12/15/11  4:08 AM      Component Value Range Comment   Glucose-Capillary 173 (*) 70 - 99 (mg/dL)   BASIC METABOLIC PANEL     Status: Abnormal   Collection Time   12/15/11  5:20 AM      Component Value Range Comment   Sodium 140  135 - 145 (mEq/L)    Potassium 3.9  3.5 - 5.1  (mEq/L)    Chloride 111  96 - 112 (mEq/L)    CO2 19  19 - 32 (mEq/L)    Glucose, Bld 171 (*) 70 - 99 (mg/dL)    BUN 16  6 - 23 (mg/dL)    Creatinine, Ser 1.61 (*) 0.50 - 1.35 (mg/dL)    Calcium 8.4  8.4 - 10.5 (mg/dL)    GFR calc non Af Amer 35 (*) >90 (mL/min)    GFR calc Af Amer 40 (*) >90 (mL/min)   CBC     Status: Abnormal   Collection Time   12/15/11  5:20 AM      Component Value Range Comment   WBC 12.1 (*) 4.0 - 10.5 (K/uL)    RBC 4.01 (*) 4.22 - 5.81 (MIL/uL)    Hemoglobin 12.0 (*) 13.0 - 17.0 (g/dL)    HCT 09.6 (*) 04.5 - 52.0 (%)    MCV 86.3  78.0 - 100.0 (fL)    MCH 29.9  26.0 - 34.0 (pg)    MCHC 34.7  30.0 - 36.0 (g/dL)    RDW 40.9  81.1 - 91.4 (%)    Platelets 149 (*) 150 - 400 (K/uL)   GLUCOSE, CAPILLARY     Status: Abnormal   Collection Time   12/15/11  7:33 AM      Component Value Range Comment   Glucose-Capillary 168 (*) 70 - 99 (mg/dL)   GLUCOSE, CAPILLARY     Status: Abnormal   Collection Time   12/15/11 12:10 PM      Component Value Range Comment   Glucose-Capillary 171 (*) 70 - 99 (mg/dL)   GLUCOSE, CAPILLARY     Status: Abnormal   Collection Time   12/15/11  4:39 PM  Component Value Range Comment   Glucose-Capillary 185 (*) 70 - 99 (mg/dL)   GLUCOSE, CAPILLARY     Status: Abnormal   Collection Time   12/15/11  7:54 PM      Component Value Range Comment   Glucose-Capillary 176 (*) 70 - 99 (mg/dL)     Dg Chest Port 1 View  12/14/2011  *RADIOLOGY REPORT*  Clinical Data: Cough.  PORTABLE CHEST - 1 VIEW  Comparison: 01/07 08/22/2012.  Findings: Dilated tortuous aorta.  Degree of uncoiling appears slightly more prominent which may be related to decreased inspiration.  Increased markings lung bases some which appear chronic. Superimposed subtle infiltrate medial aspect of the lung bases would be difficult to exclude on this portable exam.  Cardiomegaly.  No pulmonary edema or gross pneumothorax.  IMPRESSION: Dilated tortuous aorta.  Increased markings lung  bases some which appear chronic. Superimposed subtle infiltrate medial aspect of the lung bases would be difficult to exclude on this portable exam.  Cardiomegaly.  Original Report Authenticated By: Fuller Canada, M.D.   Dg Abd 2 Views  12/15/2011  *RADIOLOGY REPORT*  Clinical Data: Small bowel obstruction.  ABDOMEN - 2 VIEW  Comparison: Plain films of the abdomen and 12/13/2011 and 12/14/2011.  Findings: Short air fluid levels are again seen and small bowel. Dilated loops of small bowel are again identified measuring up to 5.6 cm.  There has been some increase in dilatation of small bowel loops in the left upper quadrant of the abdomen. No free intraperitoneal air is identified.  IMPRESSION: Mild worsening of small bowel obstruction.  Original Report Authenticated By: Bernadene Bell. D'ALESSIO, M.D.   Dg Abd 2 Views  12/14/2011  *RADIOLOGY REPORT*  Clinical Data: Follow up partial small bowel obstruction.  ABDOMEN - 2 VIEW  Comparison: 12/13/2011  Findings: Two views of the abdomen were obtained.  There is gas in the large and small bowel.  Persistent air fluid levels in the small bowel.  The degree of small bowel distention has not significantly changed.  Stool in the right colon.  No evidence to suggest free air. There may be small pleural effusions.  No acute bony abnormalities.  IMPRESSION: Stable appearance of the dilated small bowel loops.  The findings remain compatible with a partial small bowel obstruction and minimal change since the prior examination.  Suspect small pleural effusions.  Original Report Authenticated By: Richarda Overlie, M.D.    Review of Systems  Constitutional: Negative for fever and chills.  Gastrointestinal: Positive for vomiting (2 days ago one episode of emesis). Negative for heartburn, nausea, abdominal pain and blood in stool.   Blood pressure 213/104, pulse 110, temperature 99.3 F (37.4 C), temperature source Oral, resp. rate 18, height 6\' 3"  (1.905 m), weight 183 lb 6.8 oz  (83.2 kg), SpO2 96.00%. Physical Exam  Vitals reviewed. Constitutional: No distress.  Neck: Neck supple.  Cardiovascular: Normal rate, regular rhythm and normal heart sounds.   Respiratory: Effort normal and breath sounds normal. He has no wheezes. He has no rales.  GI: Soft. Normal appearance. He exhibits distension (mild). Bowel sounds are decreased. There is no tenderness. A hernia is present. Hernia confirmed positive in the right inguinal area (mildly tender but easily reducible). Hernia confirmed negative in the left inguinal area.    Lymphadenopathy:    He has no cervical adenopathy.  Skin: He is not diaphoretic.    Assessment/Plan: pSBO  He appears to have pSBO possibly by xray and history.  He could also have  ileus from other medical issues as he is passing flatus.  He has RIH that has some discomfort but this is easily reducible and he is thin so I don't think this is source.  I did discuss he should likely have this repaired as outpt once other medical issues are better.  He did not tell me he had prior [facility] repair. I think reasonable to place ngt.  I ordered CT scan without iv contrast to assess what is cause of this a little better.  He has no surgical indication at this time  [facility] 12/15/2011, 9:39 PM

## 2011-12-16 ENCOUNTER — Inpatient Hospital Stay (HOSPITAL_COMMUNITY): Payer: Medicare Other

## 2011-12-16 DIAGNOSIS — I714 Abdominal aortic aneurysm, without rupture: Secondary | ICD-10-CM

## 2011-12-16 DIAGNOSIS — R109 Unspecified abdominal pain: Secondary | ICD-10-CM

## 2011-12-16 LAB — GLUCOSE, CAPILLARY
Glucose-Capillary: 166 mg/dL — ABNORMAL HIGH (ref 70–99)
Glucose-Capillary: 177 mg/dL — ABNORMAL HIGH (ref 70–99)
Glucose-Capillary: 171 mg/dL — ABNORMAL HIGH (ref 70–99)
Glucose-Capillary: 135 mg/dL — ABNORMAL HIGH (ref 70–99)
Glucose-Capillary: 132 mg/dL — ABNORMAL HIGH (ref 70–99)
Glucose-Capillary: 119 mg/dL — ABNORMAL HIGH (ref 70–99)

## 2011-12-16 LAB — BASIC METABOLIC PANEL
Calcium: 9.1 mg/dL (ref 8.4–10.5)
Creatinine, Ser: 1.87 mg/dL — ABNORMAL HIGH (ref 0.50–1.35)
GFR calc Af Amer: 43 mL/min — ABNORMAL LOW (ref >90)
GFR calc non Af Amer: 37 mL/min — ABNORMAL LOW (ref >90)

## 2011-12-16 MED ORDER — HYDRALAZINE HCL 20 MG/ML IJ SOLN
20.0000 mg | INTRAMUSCULAR | Status: DC | PRN
  Administered 2011-12-16: 20 mg via INTRAVENOUS
  Filled 2011-12-16: qty 1

## 2011-12-16 MED ORDER — METOPROLOL TARTRATE 1 MG/ML IV SOLN
5.0000 mg | INTRAVENOUS | Status: DC
  Administered 2011-12-16 – 2011-12-17 (×5): 5 mg via INTRAVENOUS
  Filled 2011-12-16 (×7): qty 5

## 2011-12-16 MED ORDER — LABETALOL HCL 5 MG/ML IV SOLN
10.0000 mg | INTRAVENOUS | Status: DC | PRN
  Administered 2011-12-16 – 2011-12-17 (×3): 10 mg via INTRAVENOUS
  Filled 2011-12-16 (×3): qty 4

## 2011-12-16 MED ORDER — CLONIDINE HCL 0.1 MG PO TABS
0.1000 mg | ORAL_TABLET | Freq: Once | ORAL | Status: AC
  Administered 2011-12-16: 0.1 mg via ORAL
  Filled 2011-12-16: qty 1

## 2011-12-16 NOTE — Progress Notes (Signed)
Subjective: No flatus, abdominal pain earlier but better now  Objective: Vital signs in last 24 hours: Temp:  [97.9 F (36.6 C)-99.3 F (37.4 C)] 99.1 F (37.3 C) (01/13 0415) Pulse Rate:  [82-111] 94  (01/13 0415) Resp:  [18] 18  (01/13 0415) BP: (182-213)/(95-118) 182/98 mmHg (01/13 0415) SpO2:  [94 %-96 %] 94 % (01/13 0415) Last BM Date: 12/13/11  Intake/Output from previous day: 01/12 0701 - 01/13 0700 In: 3300 [I.V.:2700; NG/GT:600] Out: 1775 [Urine:1125; Emesis/NG output:650] Intake/Output this shift:    General appearance: alert and cooperative Resp: clear to auscultation bilaterally GI: Soft, NT, quiet RIH Lab Results:   [facility] 12/15/11 0520 12/14/11 0657  WBC 12.1* 12.2*  HGB 12.0* 13.7  HCT 34.6* 38.7*  PLT 149* 159   BMET  Basename 12/15/11 0520 12/14/11 0657  NA 140 141  K 3.9 3.8  CL 111 110  CO2 19 20  GLUCOSE 171* 156*  BUN 16 16  CREATININE 1.96* 1.83*  CALCIUM 8.4 8.7   PT/INR No results found for this basename: LABPROT:2,INR:2 in the last 72 hours ABG No results found for this basename: PHART:2,PCO2:2,PO2:2,HCO3:2 in the last 72 hours  Studies/Results: Ct Abdomen Pelvis Wo Contrast  12/16/2011  *RADIOLOGY REPORT*  Clinical Data: Diabetic QS doses, history hypertension, abdominal aortic aneurysm post repair, small bowel dilatation, no bowel movement for 3 days, right groin discomfort  CT ABDOMEN AND PELVIS WITHOUT CONTRAST  Technique:  Multidetector CT imaging of the abdomen and pelvis was performed following the standard protocol without intravenous contrast. Sagittal and coronal MPR images reconstructed from axial data set.  Comparison: 02/03/2006  Findings: Bilateral lower lobe atelectasis. Bullae at lung bases. Pericardial effusion and small left pleural effusion. Aneurysmal dilatation of the proximal abdominal aorta up to 4.9 x 4.8 cm, previously 4.1 x 3.9 cm. Interval resection of the large infrarenal abdominal aortic aneurysm and  placement of an aortic by iliac graft.  Liver, spleen, atrophic pancreas, left kidney, and adrenal glands normal appearance. Small atrophic right kidney without mass or obstruction, with right renal atrophy new since previous exam. Dilatation of proximal small bowel loops with normal caliber distal small bowel loops compatible with mid small bowel obstruction. Transition from dilated to nondilated small bowel occurs slightly left of midline in the upper pelvis, left anterolateral to the aortic graft bifurcation, question adhesion. No definite bowel wall thickening.  Scattered colonic diverticulosis without evidence of diverticulitis. Appendix not visualized. Bladder wall thickened but bladder is decompressed question artifact. Scattered free intraperitoneal fluid. Right inguinal hernia containing fat. No additional mass, adenopathy, or free air. No acute osseous findings.  IMPRESSION: Mid small bowel obstruction secondary to likely an adhesion in the upper central pelvis as above. Prior repair of a large distal abdominal aortic aneurysm. Interval increase in aneurysmal dilatation of the proximal abdominal aorta up to 4.9 x 4.8 cm in size. Interval right renal atrophy. Bibasilar atelectasis. Small left pleural and pericardial effusions. Right inguinal hernia containing fat.  Diffuse colonic diverticulosis.  Original Report Authenticated By: Lollie Marrow, M.D.   Dg Chest Port 1 View  12/14/2011  *RADIOLOGY REPORT*  Clinical Data: Cough.  PORTABLE CHEST - 1 VIEW  Comparison: 01/07 08/22/2012.  Findings: Dilated tortuous aorta.  Degree of uncoiling appears slightly more prominent which may be related to decreased inspiration.  Increased markings lung bases some which appear chronic. Superimposed subtle infiltrate medial aspect of the lung bases would be difficult to exclude on this portable exam.  Cardiomegaly.  No pulmonary  edema or gross pneumothorax.  IMPRESSION: Dilated tortuous aorta.  Increased markings lung  bases some which appear chronic. Superimposed subtle infiltrate medial aspect of the lung bases would be difficult to exclude on this portable exam.  Cardiomegaly.  Original Report Authenticated By: Fuller Canada, M.D.   Dg Abd 2 Views  12/15/2011  *RADIOLOGY REPORT*  Clinical Data: Small bowel obstruction.  ABDOMEN - 2 VIEW  Comparison: Plain films of the abdomen and 12/13/2011 and 12/14/2011.  Findings: Short air fluid levels are again seen and small bowel. Dilated loops of small bowel are again identified measuring up to 5.6 cm.  There has been some increase in dilatation of small bowel loops in the left upper quadrant of the abdomen. No free intraperitoneal air is identified.  IMPRESSION: Mild worsening of small bowel obstruction.  Original Report Authenticated By: Bernadene Bell. Maricela Curet, M.D.    Anti-infectives: Anti-infectives    None      Assessment/Plan: SPO - likely due to adhesion RIH contains only fat Continue NGT and we will follow closely  LOS: 6 days    [name] E 12/16/2011

## 2011-12-16 NOTE — Consult Note (Signed)
[facility] [place]  Referred by: Dr. Suanne Marker  Reason for referral: AAA  History of Present Illness  The patient is a 61 y.o. male who presents with chief complaint: abdominal pain.  The patient was admitted with DKA which was eventually resolved.  He then went on to develop abdominal pain and nausea.  As part of that work-up, the patient underwent a CT Abd/Pelvis without IV contrast.  That study demonstrated expansion in an previous AAA.  The patient previously underwent open AAA repair (3/4/7) by Dr. Arbie Cookey:  1. Resection and graft abdominal aortic aneurysm with placement of a 22 x 11 Hemashield aorto to right external and left common iliac artery bypass.  2. Reimplantation of inferior mesenteric artery to left limb of aortoiliac graft. The patient has been asx from that surgery and has not been seen in the vascular office for years.  The patient continues to smoke cigarettes.  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  Past Medical History  Diagnosis Date  . Diabetes mellitus   . Hypertension   . Aneurysm of aorta   . Aneurysm of abdominal aorta   . Stroke     CVA effected  eye  & leg  . Cataract, bilateral   . Renal disorder     Past Surgical History  Procedure Date  . Abdominal aortic aneurysm repair   . Hernia repair     History   Social History  . Marital Status: Divorced    Spouse Name: [name]    Number of Children: [name]  . Years of Education: [name]   Occupational History  . Not on file.   Social History Main Topics  . Smoking status: Current Everyday Smoker -- 1.0 packs/day  . Smokeless tobacco: Not on file  . Alcohol Use: Yes     little bit  . Drug Use: No  . Sexually Active: Not on file   Other Topics Concern  . Not on file   Social History Narrative  . No narrative on file    Family History  Problem Relation Age of Onset  . Cancer Mother   . Diabetes Mother   . Heart failure Father   .  Stroke Father     No current facility-administered medications on file prior to encounter.   Current Outpatient Prescriptions on File Prior to Encounter  Medication Sig Dispense Refill  . allopurinol (ZYLOPRIM) 100 MG tablet Take 100 mg by mouth daily.        . cloNIDine (CATAPRES) 0.2 MG tablet Take 0.2 mg by mouth 3 (three) times daily.       Marland Kitchen glipiZIDE (GLUCOTROL) 10 MG tablet Take 10 mg by mouth 2 (two) times daily before a meal.        . lisinopril (PRINIVIL,ZESTRIL) 20 MG tablet Take 20 mg by mouth 2 (two) times daily.       . salsalate (DISALCID) 500 MG tablet Take 500 mg by mouth 3 (three) times daily.       . simvastatin (ZOCOR) 20 MG tablet Take 20 mg by mouth at bedtime.          No Known Allergies  REVIEW OF SYSTEMS:  (Positives checked otherwise negative)  CARDIOVASCULAR: [ ]  chest pain    [ ]  chest pressure    [ ]  palpitations    [ ]  orthopnea   [ ]  dyspnea on exert. [ ]  claudication    [ ]  rest pain     [ ]   DVT     [ ]  phlebitis  PULMONARY:    [ ]  productive cough [ ]  asthma  [ ]  wheezing  NEUROLOGIC:    [ ]  weakness    [ ]  paresthesias   [ ]  aphasia    [ ]  amaurosis    [ ]  dizziness  [x]  CVA  HEMATOLOGIC:    [ ]  bleeding problems  [ ]  clotting disorders  MUSCULOSKEL: [ ]  joint pain     [ ]  joint swelling  GASTROINTEST:  [ ]   blood in stool   [ ]   Hematemesis [x]  hernia  [x]  nausea and vomitting  GENITOURINARY:   [ ]   dysuria    [ ]   Hematuria  [x]  CKD  PSYCHIATRIC:   [ ]  history of major depression  INTEGUMENTARY: [ ]  rashes    [ ]  ulcers  CONSTITUTIONAL:  [ ]  fever     [ ]  chills  Physical Examination  Filed Vitals:   12/16/11 0107 12/16/11 0415 12/16/11 0800 12/16/11 1300  BP: 209/114 182/98 180/96 192/115  Pulse: 101 94 90 98  Temp:  99.1 F (37.3 C) 98.7 F (37.1 C) 98.9 F (37.2 C)  TempSrc:  Oral Oral Oral  Resp:  18 18 20   Height:      Weight:      SpO2:  94% 95% 98%   Body mass index is 22.93 kg/(m^2).  General: A&O x 3, WDWN  Head:  [place]/AT  Ear/Nose/Throat: Hearing grossly intact, nares w/o erythema or drainage, oropharynx w/o Erythema/Exudate  Eyes: PERRLA, EOMI, arcus senilus  Neck: Supple, no nuchal rigidity, no palpable LAD  Pulmonary: Sym exp, good air movt, CTAB, no rales, rhonchi, & wheezing  Cardiac: RRR, Nl S1, S2, no Murmurs, rubs or gallops  Vascular: Vessel Right Left  Radial Palpable Palpable  Brachial Palpable Palpable  Carotid Palpable, without bruit Palpable, without bruit  Aorta Non-palpable [name]  Femoral Palpable Palpable  Popliteal Non-palpable Non-palpable  PT Palpable Palpable  DP Palpable Palpable   Gastrointestinal: soft, mild distension, TTP epigastric and RLQ, -G/R, - HSM, - masses, - CVAT B, obvious large RIH, easily reduced, well healing midline incision  Musculoskeletal: M/S 5/5 throughout , Extremities without ischemic changes   Neurologic: CN 2-12 intact , Pain and light touch intact in extremities , Motor exam as listed above  Psychiatric: Judgment intact, Mood & affect appropriate for pt's clinical situation  Dermatologic: See M/S exam for extremity exam, no rashes otherwise noted  Lymph : No Cervical, Axillary, or Inguinal lymphadenopathy   Radiology Ct Abdomen Pelvis Wo Contrast  12/16/2011  *RADIOLOGY REPORT*  Clinical Data: Diabetic QS doses, history hypertension, abdominal aortic aneurysm post repair, small bowel dilatation, no bowel movement for 3 days, right groin discomfort  CT ABDOMEN AND PELVIS WITHOUT CONTRAST  Technique:  Multidetector CT imaging of the abdomen and pelvis was performed following the standard protocol without intravenous contrast. Sagittal and coronal MPR images reconstructed from axial data set.  Comparison: 02/03/2006  Findings: Bilateral lower lobe atelectasis. Bullae at lung bases. Pericardial effusion and small left pleural effusion. Aneurysmal dilatation of the proximal abdominal aorta up to 4.9 x 4.8 cm, previously 4.1 x 3.9 cm. Interval  resection of the large infrarenal abdominal aortic aneurysm and placement of an aortic by iliac graft.  Liver, spleen, atrophic pancreas, left kidney, and adrenal glands normal appearance. Small atrophic right kidney without mass or obstruction, with right renal atrophy new since previous exam. Dilatation of proximal small bowel loops with  normal caliber distal small bowel loops compatible with mid small bowel obstruction. Transition from dilated to nondilated small bowel occurs slightly left of midline in the upper pelvis, left anterolateral to the aortic graft bifurcation, question adhesion. No definite bowel wall thickening.  Scattered colonic diverticulosis without evidence of diverticulitis. Appendix not visualized. Bladder wall thickened but bladder is decompressed question artifact. Scattered free intraperitoneal fluid. Right inguinal hernia containing fat. No additional mass, adenopathy, or free air. No acute osseous findings.  IMPRESSION: Mid small bowel obstruction secondary to likely an adhesion in the upper central pelvis as above. Prior repair of a large distal abdominal aortic aneurysm. Interval increase in aneurysmal dilatation of the proximal abdominal aorta up to 4.9 x 4.8 cm in size. Interval right renal atrophy. Bibasilar atelectasis. Small left pleural and pericardial effusions. Right inguinal hernia containing fat.  Diffuse colonic diverticulosis.  Original Report Authenticated By: Lollie Marrow, M.D.   Dg Abd 2 Views  12/16/2011  *RADIOLOGY REPORT*  Clinical Data: Partial small bowel obstruction follow-up.  ABDOMEN - 2 VIEW  Comparison: CT of earlier in the morning at 0134 hours.  Findings: 1146 hours.  Nasogastric terminates at the gastroesophageal junction, with the side port above the GE junction.  The upright view demonstrates numerous small bowel air fluid levels, without convincing evidence of free intraperitoneal air.  Small bowel dilatation persists.  Example loop measures 4.5 cm  versus 3.2 cm on the the CT of earlier today.  Remains gas within the colon.  No pneumatosis.  Distal gas identified.  Phleboliths in the pelvis.  IMPRESSION:  1.  Persistent likely minimally progressive partial small bowel obstruction. 2.  No free intraperitoneal air or other acute complication. 3.  Nasogastric tube has been retracted to the GE junction. Consider advancement.  This study was made a "call report."  Original Report Authenticated By: Consuello Bossier, M.D.   Dg Abd 2 Views  12/15/2011  *RADIOLOGY REPORT*  Clinical Data: Small bowel obstruction.  ABDOMEN - 2 VIEW  Comparison: Plain films of the abdomen and 12/13/2011 and 12/14/2011.  Findings: Short air fluid levels are again seen and small bowel. Dilated loops of small bowel are again identified measuring up to 5.6 cm.  There has been some increase in dilatation of small bowel loops in the left upper quadrant of the abdomen. No free intraperitoneal air is identified.  IMPRESSION: Mild worsening of small bowel obstruction.  Original Report Authenticated By: Bernadene Bell. Maricela Curet, M.D.    Based on my read of the CT Abd/Pelvis, there appears to be expansion of the infrarenal aorta where the proximal anastomosis of an open aortic repair should be located.  The lack of contrast limits the ability to fully evaluate the aorta.  Laboratory BMET    Component Value Date/Time   NA 138 12/16/2011 0839   K 3.7 12/16/2011 0839   CL 104 12/16/2011 0839   CO2 19 12/16/2011 0839   GLUCOSE 167* 12/16/2011 0839   BUN 16 12/16/2011 0839   CREATININE 1.87* 12/16/2011 0839   CALCIUM 9.1 12/16/2011 0839   GFRNONAA 37* 12/16/2011 0839   GFRAA 43* 12/16/2011 0839    CBC    Component Value Date/Time   WBC 12.1* 12/15/2011 0520   RBC 4.01* 12/15/2011 0520   HGB 12.0* 12/15/2011 0520   HCT 34.6* 12/15/2011 0520   PLT 149* 12/15/2011 0520   MCV 86.3 12/15/2011 0520   MCH 29.9 12/15/2011 0520   MCHC 34.7 12/15/2011 0520   RDW 14.9 12/15/2011  0520   LYMPHSABS 4.3*  12/12/2011 0530   MONOABS 0.8 12/12/2011 0530   EOSABS 0.4 12/12/2011 0530   BASOSABS 0.0 12/12/2011 0530     Medical Decision Making  The patient is a 61 y.o. 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  Further interrogation of the aortic neck via Aortogram (possibly carbon dioxide contrast vs single injection) may be necessary.  Strict blood pressure titration recommended, including transfer to ICU setting for IV control with IV beta blocker recommended to decrease risk of rupture.  Dr. Arbie Cookey will be by to see the patient in the AM.  I emphasized the importance of maximal medical management including strict control of blood pressure, blood glucose, and lipid levels, obtaining regular exercise, and cessation of smoking.    Thank you for allowing [place] to participate in this patient's care.  Leonides Sake, MD [facility] [place] Office: [phone] Pager: [phone]  12/16/2011, 6:11 PM

## 2011-12-16 NOTE — Progress Notes (Signed)
Subjective: States still some abdominal lower abd pain this am, no further n/v, no flatus today, no bm. Increased NG output noted overnight per nsg. Objective: Vital signs in last 24 hours: Temp:  [97.9 F (36.6 C)-99.3 F (37.4 C)] 99.1 F (37.3 C) (01/13 0415) Pulse Rate:  [82-111] 94  (01/13 0415) Resp:  [18] 18  (01/13 0415) BP: (182-213)/(95-118) 182/98 mmHg (01/13 0415) SpO2:  [94 %-96 %] 94 % (01/13 0415) Last BM Date: 12/13/11 Intake/Output from previous day: 01/12 0701 - 01/13 0700 In: 3300 [I.V.:2700; NG/GT:600] Out: 1775 [Urine:1125; Emesis/NG output:650] Intake/Output this shift:      General Appearance:    Alert, cooperative, in no apparent distress  Lungs:     Clear to auscultation bilaterally, respirations unlabored   Heart:    Regular rate and rhythm, S1 and S2 normal, no murmur, rub   or gallop  Abdomen:     Soft, mild lower abd. tenderness,no rebound tenderness decreased bowel sounds, no masses palpable..  Extremities:   Extremities normal, atraumatic, no cyanosis or edema  Neurologic:   CNII-XII intact, nonfocal    Weight change:   Intake/Output Summary (Last 24 hours) at 12/16/11 1034 Last data filed at 12/16/11 0500  Gross per 24 hour  Intake   3300 ml  Output   1775 ml  Net   1525 ml    Lab Results:   [name] 12/16/11 0839 12/15/11 0520  NA 138 140  K 3.7 3.9  CL 104 111  CO2 19 19  GLUCOSE 167* 171*  BUN 16 16  CREATININE 1.87* 1.96*  CALCIUM 9.1 8.4    Basename 12/15/11 0520 12/14/11 0657  WBC 12.1* 12.2*  HGB 12.0* 13.7  HCT 34.6* 38.7*  PLT 149* 159  MCV 86.3 85.1   PT/INR No results found for this basename: LABPROT:2,INR:2 in the last 72 hours ABG No results found for this basename: PHART:2,PCO2:2,PO2:2,HCO3:2 in the last 72 hours  Micro Results: Recent Results (from the past 240 hour(s))  URINE CULTURE     Status: Normal   Collection Time   12/11/11 12:55 PM      Component Value Range Status Comment   Specimen  Description URINE, CLEAN CATCH   Final    Special Requests NONE   Final    Setup Time [id number]   Final    Colony Count NO GROWTH   Final    Culture NO GROWTH   Final    Report Status 12/12/2011 FINAL   Final   CULTURE, BLOOD (ROUTINE X 2)     Status: Normal (Preliminary result)   Collection Time   12/11/11  1:23 PM      Component Value Range Status Comment   Specimen Description BLOOD LEFT ANTECUBITAL   Final    Special Requests BOTTLES DRAWN AEROBIC AND ANAEROBIC 10CC   Final    Setup Time [id number]   Final    Culture     Final    Value:        BLOOD CULTURE RECEIVED NO GROWTH TO DATE CULTURE WILL BE HELD FOR 5 DAYS BEFORE ISSUING A FINAL NEGATIVE REPORT   Report Status PENDING   Incomplete   CULTURE, BLOOD (ROUTINE X 2)     Status: Normal (Preliminary result)   Collection Time   12/11/11  1:29 PM      Component Value Range Status Comment   Specimen Description BLOOD LEFT HAND   Final    Special Requests BOTTLES DRAWN AEROBIC AND ANAEROBIC 10CC  Final    Setup Time [id number]   Final    Culture     Final    Value:        BLOOD CULTURE RECEIVED NO GROWTH TO DATE CULTURE WILL BE HELD FOR 5 DAYS BEFORE ISSUING A FINAL NEGATIVE REPORT   Report Status PENDING   Incomplete   URINE CULTURE     Status: Normal   Collection Time   12/13/11 12:55 PM      Component Value Range Status Comment   Specimen Description URINE, CLEAN CATCH   Final    Special Requests NONE   Final    Setup Time [id number]   Final    Colony Count NO GROWTH   Final    Culture NO GROWTH   Final    Report Status 12/14/2011 FINAL   Final    Studies/Results: Ct Abdomen Pelvis Wo Contrast  12/16/2011  *RADIOLOGY REPORT*  Clinical Data: Diabetic QS doses, history hypertension, abdominal aortic aneurysm post repair, small bowel dilatation, no bowel movement for 3 days, right groin discomfort  CT ABDOMEN AND PELVIS WITHOUT CONTRAST  Technique:  Multidetector CT imaging of the abdomen and pelvis was performed  following the standard protocol without intravenous contrast. Sagittal and coronal MPR images reconstructed from axial data set.  Comparison: 02/03/2006  Findings: Bilateral lower lobe atelectasis. Bullae at lung bases. Pericardial effusion and small left pleural effusion. Aneurysmal dilatation of the proximal abdominal aorta up to 4.9 x 4.8 cm, previously 4.1 x 3.9 cm. Interval resection of the large infrarenal abdominal aortic aneurysm and placement of an aortic by iliac graft.  Liver, spleen, atrophic pancreas, left kidney, and adrenal glands normal appearance. Small atrophic right kidney without mass or obstruction, with right renal atrophy new since previous exam. Dilatation of proximal small bowel loops with normal caliber distal small bowel loops compatible with mid small bowel obstruction. Transition from dilated to nondilated small bowel occurs slightly left of midline in the upper pelvis, left anterolateral to the aortic graft bifurcation, question adhesion. No definite bowel wall thickening.  Scattered colonic diverticulosis without evidence of diverticulitis. Appendix not visualized. Bladder wall thickened but bladder is decompressed question artifact. Scattered free intraperitoneal fluid. Right inguinal hernia containing fat. No additional mass, adenopathy, or free air. No acute osseous findings.  IMPRESSION: Mid small bowel obstruction secondary to likely an adhesion in the upper central pelvis as above. Prior repair of a large distal abdominal aortic aneurysm. Interval increase in aneurysmal dilatation of the proximal abdominal aorta up to 4.9 x 4.8 cm in size. Interval right renal atrophy. Bibasilar atelectasis. Small left pleural and pericardial effusions. Right inguinal hernia containing fat.  Diffuse colonic diverticulosis.  Original Report Authenticated By: Lollie Marrow, M.D.   Dg Chest Port 1 View  12/14/2011  *RADIOLOGY REPORT*  Clinical Data: Cough.  PORTABLE CHEST - 1 VIEW  Comparison:  01/07 08/22/2012.  Findings: Dilated tortuous aorta.  Degree of uncoiling appears slightly more prominent which may be related to decreased inspiration.  Increased markings lung bases some which appear chronic. Superimposed subtle infiltrate medial aspect of the lung bases would be difficult to exclude on this portable exam.  Cardiomegaly.  No pulmonary edema or gross pneumothorax.  IMPRESSION: Dilated tortuous aorta.  Increased markings lung bases some which appear chronic. Superimposed subtle infiltrate medial aspect of the lung bases would be difficult to exclude on this portable exam.  Cardiomegaly.  Original Report Authenticated By: Fuller Canada, M.D.   Dg Abd 2 Views  12/15/2011  *RADIOLOGY REPORT*  Clinical Data: Small bowel obstruction.  ABDOMEN - 2 VIEW  Comparison: Plain films of the abdomen and 12/13/2011 and 12/14/2011.  Findings: Short air fluid levels are again seen and small bowel. Dilated loops of small bowel are again identified measuring up to 5.6 cm.  There has been some increase in dilatation of small bowel loops in the left upper quadrant of the abdomen. No free intraperitoneal air is identified.  IMPRESSION: Mild worsening of small bowel obstruction.  Original Report Authenticated By: Bernadene Bell. Maricela Curet, M.D.   Medications:  Scheduled Meds:    . cloNIDine  0.3 mg Transdermal Weekly  . cloNIDine  0.1 mg Oral Once  . insulin aspart  0-15 Units Subcutaneous Q4H  . insulin glargine  8 Units Subcutaneous QHS  . metoprolol  5 mg Intravenous Q6H  . nicotine  21 mg Transdermal Daily  . pantoprazole (PROTONIX) IV  40 mg Intravenous Q24H  . rosuvastatin  5 mg Oral q1800  . DISCONTD: metoprolol  5 mg Intravenous Q8H   Continuous Infusions:    . sodium chloride 150 mL/hr at 12/14/11 1914   PRN Meds:.dextrose, hydrALAZINE, morphine injection, ondansetron (ZOFRAN) IV, DISCONTD: hydrALAZINE Assessment/Plan: Patient Active Hospital Problem List: Partial small bowel  obstruction Appreciate surgery input, continue NG as output still increased as above. CT scan of his abdomen with adhesion noted, surgery to follow for further recommendations the -Continue pain management DKA/uncontrolled (diabetic ketoacidoses) (12/11/2011) - better BG control,continue lantus, SSI. Acidosis resolved.  UNCONTROLLED HYPERTENSION (11/20/2006) Change to clonidine patch,  will increase iv metoprolol frequency and further, much better her blood , change to when necessary IV labetalol, given the aortic aneurysm noted above Acute on CKD Continue hydration and follow, cr 1.4-1.6 about a year ago.  Abdominal aortic aneurysm with history of ABDOMINAL AORTIC ANEURYSM REPAIR  -Per CT there has been interval increase in  abdominal aortic aneurysm chest 4.9 x 4.8 cm -Will optimize blood pressure control as above, I have called vascular surgery for further recommendations-awaiting callback    Hyperlipidemia (12/11/2011) -Continue Crestor     LOS: 6 days   Minh Jasper C 12/16/2011, 10:34 AM

## 2011-12-17 ENCOUNTER — Inpatient Hospital Stay (HOSPITAL_COMMUNITY): Payer: Medicare Other

## 2011-12-17 DIAGNOSIS — I10 Essential (primary) hypertension: Secondary | ICD-10-CM

## 2011-12-17 DIAGNOSIS — I714 Abdominal aortic aneurysm, without rupture: Secondary | ICD-10-CM

## 2011-12-17 DIAGNOSIS — K56609 Unspecified intestinal obstruction, unspecified as to partial versus complete obstruction: Secondary | ICD-10-CM

## 2011-12-17 LAB — CBC
HCT: 36.3 % — ABNORMAL LOW (ref 39.0–52.0)
Hemoglobin: 12.3 g/dL — ABNORMAL LOW (ref 13.0–17.0)
RDW: 14.8 % (ref 11.5–15.5)
WBC: 11.3 10*3/uL — ABNORMAL HIGH (ref 4.0–10.5)

## 2011-12-17 LAB — BASIC METABOLIC PANEL
Chloride: 107 mEq/L (ref 96–112)
GFR calc Af Amer: 41 mL/min — ABNORMAL LOW (ref >90)
GFR calc non Af Amer: 36 mL/min — ABNORMAL LOW (ref >90)
Potassium: 3.6 mEq/L (ref 3.5–5.1)

## 2011-12-17 LAB — GLUCOSE, CAPILLARY
Glucose-Capillary: 112 mg/dL — ABNORMAL HIGH (ref 70–99)
Glucose-Capillary: 117 mg/dL — ABNORMAL HIGH (ref 70–99)

## 2011-12-17 MED ORDER — SODIUM CHLORIDE 0.9 % IV SOLN
INTRAVENOUS | Status: DC
  Administered 2011-12-18 (×2): via INTRAVENOUS

## 2011-12-17 MED ORDER — NICARDIPINE HCL IN NACL 20-0.86 MG/200ML-% IV SOLN
5.0000 mg/h | INTRAVENOUS | Status: DC
  Administered 2011-12-17 – 2011-12-18 (×5): 5 mg/h via INTRAVENOUS
  Filled 2011-12-17 (×12): qty 200

## 2011-12-17 MED ORDER — LABETALOL HCL 5 MG/ML IV SOLN
0.5000 mg/min | INTRAVENOUS | Status: DC
  Administered 2011-12-17: 0.5 mg/min via INTRAVENOUS
  Filled 2011-12-17: qty 100

## 2011-12-17 MED ORDER — METOPROLOL TARTRATE 1 MG/ML IV SOLN
2.5000 mg | Freq: Four times a day (QID) | INTRAVENOUS | Status: DC
  Administered 2011-12-17: 5 mg via INTRAVENOUS
  Filled 2011-12-17 (×5): qty 5

## 2011-12-17 NOTE — Consult Note (Signed)
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  Began complaining of abdominal pain with nausea/vomiting on 1/10, KUB c/w mid small bowel obstruction.  Pt was maintained NPO and NGT placed.  1/12 had not improved and CCS was consulted and recommended conservative management.  CT of the abdomen / pelvis demonstrated Mid small bowel obstruction secondary to likely an adhesion in the upper central pelvis. Prior repair of a large distal abdominal aortic aneurysm. Interval increase in aneurysmal dilatation of the proximal abdominal aorta up to 4.9 x 4.8 cm in size. Interval right renal atrophy. Bibasilar atelectasis. Small left pleural and pericardial effusions. Right inguinal hernia containing fat. Diffuse colonic diverticulosis.  Concurrently, pts BP became difficult to control as he is NPO and required IV Labetalol gtt with tx to ICU for management.  PCCM consulted for BP management 1/14.     Lines / Drains: 1/10 NGT>>>  Cultures: 1/18 BCx2>>>neg 1/10 UC>>>neg  Antibiotics: none   Tests / Events: 1/10 KUB>>>There are several dilated small bowel loops with differential air-fluid levels in the left upper quadrant, worrisome for a partial mechanical small-bowel obstruction. No pneumoperitoneum.  1/13 CT ABD/PELVIS>>>Mid small bowel obstruction secondary to likely an adhesion in the upper central pelvis as above. Prior repair of a large distal abdominal aortic aneurysm. Interval increase in aneurysmal dilatation of the proximal abdominal aorta up to 4.9 x 4.8 cm in size. Interval right renal atrophy. Bibasilar atelectasis. Small left pleural and pericardial effusions. Right inguinal hernia containing fat. Diffuse colonic diverticulosis.    Past Medical History  Diagnosis Date  . Diabetes mellitus     . Hypertension   . Aneurysm of aorta   . Aneurysm of abdominal aorta   . Stroke     CVA effected  eye  & leg  . Cataract, bilateral   . Renal disorder     Past Surgical History  Procedure Date  . Abdominal aortic aneurysm repair   . Hernia repair     Prior to Admission medications   Medication Sig Start Date End Date Taking? Authorizing Provider  allopurinol (ZYLOPRIM) 100 MG tablet Take 100 mg by mouth daily.     Yes Historical Provider, MD  aspirin EC 81 MG tablet Take 81 mg by mouth every morning.     Yes Historical Provider, MD  cloNIDine (CATAPRES) 0.2 MG tablet Take 0.2 mg by mouth 3 (three) times daily.    Yes Historical Provider, MD  diltiazem (TIAZAC) 360 MG 24 hr capsule Take 360 mg by mouth daily.     Yes Historical Provider, MD  glipiZIDE (GLUCOTROL) 10 MG tablet Take 10 mg by mouth 2 (two) times daily before a meal.     Yes Historical Provider, MD  labetalol (NORMODYNE) 100 MG tablet Take 100 mg by mouth 2 (two) times daily.     Yes Historical Provider, MD  lisinopril (PRINIVIL,ZESTRIL) 20 MG tablet Take 20 mg by mouth 2 (two) times daily.    Yes Historical Provider, MD  salsalate (DISALCID) 500 MG tablet Take 500 mg by mouth 3 (three) times daily.    Yes Historical Provider, MD  simvastatin (ZOCOR) 20 MG tablet Take 20 mg by mouth at bedtime.     Yes Historical Provider, MD  vitamin B-12 (CYANOCOBALAMIN) 1000 MCG  tablet Take 1,000 mcg by mouth daily.     Yes Historical Provider, MD    Allergies No Known Allergies  Family History Family History  Problem Relation Age of Onset  . Cancer Mother   . Diabetes Mother   . Heart failure Father   . Stroke Father     Social History  reports that he has been smoking.  He does not have any smokeless tobacco history on file. He reports that he drinks alcohol. He reports that he does not use illicit drugs.  Review Of Systems:  Gen: Denies fever, chills, weight change, fatigue, night sweats HEENT: Denies blurred vision,  double vision, hearing loss, tinnitus, sinus congestion, rhinorrhea, sore throat, neck stiffness, dysphagia PULM: Denies shortness of breath, cough, sputum production, hemoptysis, wheezing CV: Denies chest pain, edema, orthopnea, paroxysmal nocturnal dyspnea, palpitations GI: Denies diarrhea, hematochezia, melena.  SEE HPI.  GU: Denies dysuria, hematuria, polyuria, oliguria, urethral discharge Endocrine: Denies hot or cold intolerance, polyuria, polyphagia or appetite change Derm: Denies rash, dry skin, scaling or peeling skin change Heme: Denies easy bruising, bleeding, bleeding gums Neuro: Denies headache, numbness, weakness, slurred speech, loss of memory or consciousness  Vital Signs: Temp:  [98.1 F (36.7 C)-98.9 F (37.2 C)] 98.4 F (36.9 C) (01/14 1208) Pulse Rate:  [66-98] 81  (01/14 1208) Resp:  [16-20] 18  (01/14 1208) BP: (155-200)/(88-115) 184/104 mmHg (01/14 1208) SpO2:  [94 %-98 %] 97 % (01/14 1208) I/O last 3 completed shifts: In: 4357.9 [I.V.:3107.9; NG/GT:1250] Out: 2400 [Urine:1000; Emesis/NG output:1400]  Physical Examination: General: chronically ill, awake/alert Neuro: AAOx4, speech clear, MAE CV: S1S2 rrr, no m/r/g PULM: resp's even/non-labored, lungs bilaterally clear ZO:XWRUE/AVWU, NGT in place, BSx4 hypoactive Extremities:  Warm/dry, no edema, no rash or lesions    Labs    CBC  Lab 12/17/11 0500 12/15/11 0520 12/14/11 0657  HGB 12.3* 12.0* 13.7  HCT 36.3* 34.6* 38.7*  WBC 11.3* 12.1* 12.2*  PLT 177 149* 159     BMET  Lab 12/17/11 0500 12/16/11 0839 12/15/11 0520 12/14/11 0657 12/13/11 0540  NA 140 138 140 141 134*  K 3.6 3.7 -- -- --  CL 107 104 111 110 108  CO2 19 19 19 20 19   GLUCOSE 115* 167* 171* 156* 224*  BUN 18 16 16 16  27*  CREATININE 1.93* 1.87* 1.96* 1.83* 2.17*  CALCIUM 9.1 9.1 8.4 8.7 8.3*  MG -- -- -- -- --  PHOS -- -- -- -- --     Lab 12/11/11 0450 12/10/11 2351  PHART 7.369 --  PCO2ART 33.8* --  PO2ART 56.3* --    HCO3 19.0* --  TCO2 20.1 20  O2SAT 87.7 --    Radiology: see above  Assessment and Plan:  DKA (diabetic ketoacidoses) Assessment: Resolved Plan: -continue SSI as ordered  HYPERTENSION Assessment: On labetalol, diltiazem, clonodine and lisinopril at home.  Now NPO and BP difficult to manage. Plan: -change to cardene gtt with scheduled beta blocker push to avoid reflex tachycardia. -tx to ICU for BP mgmt   ABDOMINAL AORTIC ANEURYSM REPAIR, HX OF Assessment: ? Increase on CT abd Plan: -BP / HR control as above -Dr. Imogene Burn following   SBO Assessment: see CT results Plan:  -CCS following -maintain NGT, NPO    Hyperlipidemia Assessment: Plan: -will need to restart home meds when able    Best practices / Disposition: -->Code Status: Full Code -->DVT Px: SCD's -->GI Px: protonix -->Diet: NPO    Canary Brim, NP-C [facility] Pgr: [phone]  12/17/2011, 12:27 PM    Pt seen and examined and database reviewed. I agree with above findings, assessment and plan  Billy Fischer, MD;  PCCM service; Mobile [phone]

## 2011-12-17 NOTE — Progress Notes (Signed)
Pateint received from 3700 to bed 2913. Report received from [place], [place]. AxOx3. No C/O pain at present. On labetalol drip and will continue monitoring blood pressure closely. See flowsheet for full assessment.

## 2011-12-17 NOTE — Progress Notes (Signed)
Subjective: Still with some abdominal lower abd pain this am, no further n/v, but still no flatus today, no bm. Also still Increased NG output noted overnight. Denies CP, no back pain denies sob. Objective: Vital signs in last 24 hours: Temp:  [98.1 F (36.7 C)-98.9 F (37.2 C)] 98.8 F (37.1 C) (01/14 0850) Pulse Rate:  [83-98] 83  (01/14 0850) Resp:  [18-20] 18  (01/13 2328) BP: (155-200)/(88-115) 176/94 mmHg (01/14 0850) SpO2:  [94 %-98 %] 94 % (01/14 0850) Last BM Date: 12/13/11 Intake/Output from previous day: 01/13 0701 - 01/14 0700 In: 2857.9 [I.V.:2207.9; NG/GT:650] Out: 1100 [Urine:350; Emesis/NG output:750] Intake/Output this shift:      General Appearance:    Alert, cooperative, in no apparent distress  Lungs:     Clear to auscultation bilaterally, respirations unlabored   Heart:    Regular rate and rhythm, S1 and S2 normal, no murmur, rub   or gallop  Abdomen:     Soft, mild lower abd. tenderness,no rebound tenderness decreased bowel sounds, no masses palpable..  Extremities:   Extremities normal, atraumatic, no cyanosis or edema  Neurologic:   CNII-XII intact, nonfocal    Weight change:   Intake/Output Summary (Last 24 hours) at 12/17/11 0907 Last data filed at 12/17/11 4098  Gross per 24 hour  Intake 2857.92 ml  Output   1100 ml  Net 1757.92 ml    Lab Results:   Basename 12/17/11 0500 12/16/11 0839  NA 140 138  K 3.6 3.7  CL 107 104  CO2 19 19  GLUCOSE 115* 167*  BUN 18 16  CREATININE 1.93* 1.87*  CALCIUM 9.1 9.1    Basename 12/17/11 0500 12/15/11 0520  WBC 11.3* 12.1*  HGB 12.3* 12.0*  HCT 36.3* 34.6*  PLT 177 149*  MCV 86.6 86.3   PT/INR No results found for this basename: LABPROT:2,INR:2 in the last 72 hours ABG No results found for this basename: PHART:2,PCO2:2,PO2:2,HCO3:2 in the last 72 hours  Micro Results: Recent Results (from the past 240 hour(s))  URINE CULTURE     Status: Normal   Collection Time   12/11/11 12:55 PM   Component Value Range Status Comment   Specimen Description URINE, CLEAN CATCH   Final    Special Requests NONE   Final    Setup Time [id number]   Final    Colony Count NO GROWTH   Final    Culture NO GROWTH   Final    Report Status 12/12/2011 FINAL   Final   CULTURE, BLOOD (ROUTINE X 2)     Status: Normal (Preliminary result)   Collection Time   12/11/11  1:23 PM      Component Value Range Status Comment   Specimen Description BLOOD LEFT ANTECUBITAL   Final    Special Requests BOTTLES DRAWN AEROBIC AND ANAEROBIC 10CC   Final    Setup Time [id number]   Final    Culture     Final    Value:        BLOOD CULTURE RECEIVED NO GROWTH TO DATE CULTURE WILL BE HELD FOR 5 DAYS BEFORE ISSUING A FINAL NEGATIVE REPORT   Report Status PENDING   Incomplete   CULTURE, BLOOD (ROUTINE X 2)     Status: Normal (Preliminary result)   Collection Time   12/11/11  1:29 PM      Component Value Range Status Comment   Specimen Description BLOOD LEFT HAND   Final    Special Requests BOTTLES DRAWN AEROBIC AND ANAEROBIC  10CC   Final    Setup Time [id number]   Final    Culture     Final    Value:        BLOOD CULTURE RECEIVED NO GROWTH TO DATE CULTURE WILL BE HELD FOR 5 DAYS BEFORE ISSUING A FINAL NEGATIVE REPORT   Report Status PENDING   Incomplete   URINE CULTURE     Status: Normal   Collection Time   12/13/11 12:55 PM      Component Value Range Status Comment   Specimen Description URINE, CLEAN CATCH   Final    Special Requests NONE   Final    Setup Time [id number]   Final    Colony Count NO GROWTH   Final    Culture NO GROWTH   Final    Report Status 12/14/2011 FINAL   Final    Studies/Results: Ct Abdomen Pelvis Wo Contrast  12/16/2011  *RADIOLOGY REPORT*  Clinical Data: Diabetic QS doses, history hypertension, abdominal aortic aneurysm post repair, small bowel dilatation, no bowel movement for 3 days, right groin discomfort  CT ABDOMEN AND PELVIS WITHOUT CONTRAST  Technique:  Multidetector CT  imaging of the abdomen and pelvis was performed following the standard protocol without intravenous contrast. Sagittal and coronal MPR images reconstructed from axial data set.  Comparison: 02/03/2006  Findings: Bilateral lower lobe atelectasis. Bullae at lung bases. Pericardial effusion and small left pleural effusion. Aneurysmal dilatation of the proximal abdominal aorta up to 4.9 x 4.8 cm, previously 4.1 x 3.9 cm. Interval resection of the large infrarenal abdominal aortic aneurysm and placement of an aortic by iliac graft.  Liver, spleen, atrophic pancreas, left kidney, and adrenal glands normal appearance. Small atrophic right kidney without mass or obstruction, with right renal atrophy new since previous exam. Dilatation of proximal small bowel loops with normal caliber distal small bowel loops compatible with mid small bowel obstruction. Transition from dilated to nondilated small bowel occurs slightly left of midline in the upper pelvis, left anterolateral to the aortic graft bifurcation, question adhesion. No definite bowel wall thickening.  Scattered colonic diverticulosis without evidence of diverticulitis. Appendix not visualized. Bladder wall thickened but bladder is decompressed question artifact. Scattered free intraperitoneal fluid. Right inguinal hernia containing fat. No additional mass, adenopathy, or free air. No acute osseous findings.  IMPRESSION: Mid small bowel obstruction secondary to likely an adhesion in the upper central pelvis as above. Prior repair of a large distal abdominal aortic aneurysm. Interval increase in aneurysmal dilatation of the proximal abdominal aorta up to 4.9 x 4.8 cm in size. Interval right renal atrophy. Bibasilar atelectasis. Small left pleural and pericardial effusions. Right inguinal hernia containing fat.  Diffuse colonic diverticulosis.  Original Report Authenticated By: Lollie Marrow, M.D.   Dg Abd 2 Views  12/16/2011  *RADIOLOGY REPORT*  Clinical Data:  Partial small bowel obstruction follow-up.  ABDOMEN - 2 VIEW  Comparison: CT of earlier in the morning at 0134 hours.  Findings: 1146 hours.  Nasogastric terminates at the gastroesophageal junction, with the side port above the GE junction.  The upright view demonstrates numerous small bowel air fluid levels, without convincing evidence of free intraperitoneal air.  Small bowel dilatation persists.  Example loop measures 4.5 cm versus 3.2 cm on the the CT of earlier today.  Remains gas within the colon.  No pneumatosis.  Distal gas identified.  Phleboliths in the pelvis.  IMPRESSION:  1.  Persistent likely minimally progressive partial small bowel obstruction. 2.  No free  intraperitoneal air or other acute complication. 3.  Nasogastric tube has been retracted to the GE junction. Consider advancement.  This study was made a "call report."  Original Report Authenticated By: Consuello Bossier, M.D.   Dg Abd Portable 2v  12/17/2011  *RADIOLOGY REPORT*  Clinical Data: Evaluate small bowel obstruction  PORTABLE ABDOMEN - 2 VIEW  Comparison: 12/16/2011; 12/15/2011; CT abdomen pelvis - 12/16/2011  Findings:  Grossly unchanged mild gaseous distension of several loops of small bowel in the left upper abdomen.  Air is seen within the cecum and ascending colon.  Interval advancement of enteric tube with tip now projecting over the expected location of the descending portion of the duodenum.  No definite pneumo peritoneum, pneumatosis or portal venous gas.  Limited visualization of the lower thorax suggests left basilar heterogeneous opacities, possibly atelectasis. Surgical clips are seen overlying the left mid lower abdomen. Lumbar spine degenerative change.  IMPRESSION: Grossly unchanged findings suggestive of suggestive of at least a partial small bowel obstruction.  Original Report Authenticated By: Waynard Reeds, M.D.   Medications:  Scheduled Meds:    . cloNIDine  0.3 mg Transdermal Weekly  . insulin aspart  0-15  Units Subcutaneous Q4H  . insulin glargine  8 Units Subcutaneous QHS  . metoprolol  5 mg Intravenous Q4H  . nicotine  21 mg Transdermal Daily  . pantoprazole (PROTONIX) IV  40 mg Intravenous Q24H  . rosuvastatin  5 mg Oral q1800  . DISCONTD: metoprolol  5 mg Intravenous Q6H   Continuous Infusions:    . sodium chloride 125 mL/hr at 12/16/11 0940   PRN Meds:.dextrose, labetalol, morphine injection, ondansetron (ZOFRAN) IV, DISCONTD: hydrALAZINE Assessment/Plan: Patient Active Hospital Problem List: Aortic Arch aneurysm with history of ABDOMINAL AORTIC ANEURYSM REPAIR  -Per vascular pt has 5.1 cm aortic arch aneurysm and transfer to icu for strict BP control recommended to decrease risk of rupture in this pt. He has been on iv metoprol q4  And BP still poorly controlled this am. -Will optimize blood pressure control as above,  vascular surgery/Dr Early to follow for further recommendations -I have consulted CCM for transfer to ICUU and Dr Bard Herbert has accepted thi pt to ICU/CCM service    Partial small bowel obstruction Appreciate surgery input, continue NG as output still increased as above. F/u abd xray today unchanged  CT scan of his abdomen with adhesion noted, - surgery to follow for further recommendations t -Continue pain management DKA/uncontrolled (diabetic ketoacidoses) (12/11/2011) - better BG control,continue lantus, SSI. Acidosis resolved.  UNCONTROLLED HYPERTENSION (11/20/2006) - see #1 as above for transfer to ICU for strict BP control Acute on CKD Continue hydration and follow, cr 1.4-1.6 about a year ago.   Hyperlipidemia (12/11/2011) - Crestor on hold as pt NPO    LOS: 7 days   Dan Scearce C 12/17/2011, 9:07 AM

## 2011-12-17 NOTE — Plan of Care (Signed)
Problem: Problem: Bowel/Bladder Progression Goal: BOWEL SOUNDS PRESENT/NON-DECREASED Outcome: Progressing NG tube clamped this am per orders.  Good bowel sounds.

## 2011-12-17 NOTE — Significant Event (Signed)
Called to patient's bedside at 1030am for elevated BP and need for labetalol gtt. Patient alert and oriented.  Lungs clear, heart tones regular. Initially gave PRN dose labetalol, no change in BP.   Started new PIV L upper arm. Started labetalol gtt at 1215 at 0.5mg /min.  Titrated gtt up to 2mg /min. BP 175/90  SR 76  Transferred to 2913 via bed at 1400

## 2011-12-17 NOTE — Progress Notes (Signed)
Subjective: Pt feels ok. Denies pain to me. No flatus but hears "grumblings" Hasn't been OOB much  Objective: Vital signs in last 24 hours: Temp:  [98.1 F (36.7 C)-98.9 F (37.2 C)] 98.8 F (37.1 C) (01/14 0850) Pulse Rate:  [76-98] 76  (01/14 1035) Resp:  [18-20] 18  (01/13 2328) BP: (155-200)/(88-115) 190/90 mmHg (01/14 1035) SpO2:  [94 %-98 %] 94 % (01/14 0850) Last BM Date: 12/13/11  Intake/Output this shift: Total I/O In: -  Out: 175 [Urine:175]  Physical Exam: BP 190/90  Pulse 76  Temp(Src) 98.8 F (37.1 C) (Oral)  Resp 18  Ht 6\' 3"  (1.905 m)  Wt 83.2 kg (183 lb 6.8 oz)  BMI 22.93 kg/m2  SpO2 94% Abdomen: soft, ND, NT, few BS  Labs: CBC  Basename 12/17/11 0500 12/15/11 0520  WBC 11.3* 12.1*  HGB 12.3* 12.0*  HCT 36.3* 34.6*  PLT 177 149*   BMET  Basename 12/17/11 0500 12/16/11 0839  NA 140 138  K 3.6 3.7  CL 107 104  CO2 19 19  GLUCOSE 115* 167*  BUN 18 16  CREATININE 1.93* 1.87*  CALCIUM 9.1 9.1   LFT No results found for this basename: PROT,ALBUMIN,AST,ALT,ALKPHOS,BILITOT,BILIDIR,IBILI,LIPASE in the last 72 hours PT/INR No results found for this basename: LABPROT:2,INR:2 in the last 72 hours ABG No results found for this basename: PHART:2,PCO2:2,PO2:2,HCO3:2 in the last 72 hours  Studies/Results: Ct Abdomen Pelvis Wo Contrast  12/16/2011  *RADIOLOGY REPORT*  Clinical Data: Diabetic QS doses, history hypertension, abdominal aortic aneurysm post repair, small bowel dilatation, no bowel movement for 3 days, right groin discomfort  CT ABDOMEN AND PELVIS WITHOUT CONTRAST  Technique:  Multidetector CT imaging of the abdomen and pelvis was performed following the standard protocol without intravenous contrast. Sagittal and coronal MPR images reconstructed from axial data set.  Comparison: 02/03/2006  Findings: Bilateral lower lobe atelectasis. Bullae at lung bases. Pericardial effusion and small left pleural effusion. Aneurysmal dilatation of the  proximal abdominal aorta up to 4.9 x 4.8 cm, previously 4.1 x 3.9 cm. Interval resection of the large infrarenal abdominal aortic aneurysm and placement of an aortic by iliac graft.  Liver, spleen, atrophic pancreas, left kidney, and adrenal glands normal appearance. Small atrophic right kidney without mass or obstruction, with right renal atrophy new since previous exam. Dilatation of proximal small bowel loops with normal caliber distal small bowel loops compatible with mid small bowel obstruction. Transition from dilated to nondilated small bowel occurs slightly left of midline in the upper pelvis, left anterolateral to the aortic graft bifurcation, question adhesion. No definite bowel wall thickening.  Scattered colonic diverticulosis without evidence of diverticulitis. Appendix not visualized. Bladder wall thickened but bladder is decompressed question artifact. Scattered free intraperitoneal fluid. Right inguinal hernia containing fat. No additional mass, adenopathy, or free air. No acute osseous findings.  IMPRESSION: Mid small bowel obstruction secondary to likely an adhesion in the upper central pelvis as above. Prior repair of a large distal abdominal aortic aneurysm. Interval increase in aneurysmal dilatation of the proximal abdominal aorta up to 4.9 x 4.8 cm in size. Interval right renal atrophy. Bibasilar atelectasis. Small left pleural and pericardial effusions. Right inguinal hernia containing fat.  Diffuse colonic diverticulosis.  Original Report Authenticated By: Lollie Marrow, M.D.   Dg Abd 2 Views  12/16/2011  *RADIOLOGY REPORT*  Clinical Data: Partial small bowel obstruction follow-up.  ABDOMEN - 2 VIEW  Comparison: CT of earlier in the morning at 0134 hours.  Findings: 1146 hours.  Nasogastric terminates at the gastroesophageal junction, with the side port above the GE junction.  The upright view demonstrates numerous small bowel air fluid levels, without convincing evidence of free  intraperitoneal air.  Small bowel dilatation persists.  Example loop measures 4.5 cm versus 3.2 cm on the the CT of earlier today.  Remains gas within the colon.  No pneumatosis.  Distal gas identified.  Phleboliths in the pelvis.  IMPRESSION:  1.  Persistent likely minimally progressive partial small bowel obstruction. 2.  No free intraperitoneal air or other acute complication. 3.  Nasogastric tube has been retracted to the GE junction. Consider advancement.  This study was made a "call report."  Original Report Authenticated By: Consuello Bossier, M.D.   Dg Abd Portable 2v  12/17/2011  *RADIOLOGY REPORT*  Clinical Data: Abdominal pain, possible small bowel obstruction.  PORTABLE ABDOMEN - 2 VIEW  Comparison: 12/17/2011  Findings: NG tube is in stable position within the descending duodenum.  No evidence of bowel obstruction.  Gas within nondistended large and small bowel.  No free air.  No organomegaly.  IMPRESSION: NG tube remains in the descending duodenum.  No evidence of bowel obstruction currently.  Original Report Authenticated By: Cyndie Chime, M.D.   Dg Abd Portable 2v  12/17/2011  *RADIOLOGY REPORT*  Clinical Data: Evaluate small bowel obstruction  PORTABLE ABDOMEN - 2 VIEW  Comparison: 12/16/2011; 12/15/2011; CT abdomen pelvis - 12/16/2011  Findings:  Grossly unchanged mild gaseous distension of several loops of small bowel in the left upper abdomen.  Air is seen within the cecum and ascending colon.  Interval advancement of enteric tube with tip now projecting over the expected location of the descending portion of the duodenum.  No definite pneumo peritoneum, pneumatosis or portal venous gas.  Limited visualization of the lower thorax suggests left basilar heterogeneous opacities, possibly atelectasis. Surgical clips are seen overlying the left mid lower abdomen. Lumbar spine degenerative change.  IMPRESSION: Grossly unchanged findings suggestive of suggestive of at least a partial small bowel  obstruction.  Original Report Authenticated By: Waynard Reeds, M.D.    Assessment: Principal Problem:  *DKA (diabetic ketoacidoses) Active Problems:  HYPERTENSION  ABDOMINAL AORTIC ANEURYSM REPAIR, HX OF  Hyperlipidemia PSBO-improving    Plan: X-ray looks better, suspect NG output is due to the fact that NG is in descending duodenum. Bowel gas pattern normal with plenty of gas in colon. Will clamp NG this am and encourage OOB/ambulation. Hopefully can DC NG later today and start clears.  LOS: 7 days    Marianna Fuss 12/17/2011

## 2011-12-17 NOTE — Progress Notes (Signed)
tx to 2900 b/c of BP. Denies flatus. No n/v. Tolerating NG clamping trial.   xrays reviewed - no bowel obstruction. Ng in duodeunum  D/c ng  Npo except ice chips/  I saw the patient, participated in the history, exam and medical decision making, and concur with the physician assistant's note above.  Mary Sella. Andrey Campanile, MD, FACS General, Bariatric, & Minimally Invasive Surgery [facility], [place]

## 2011-12-17 NOTE — Plan of Care (Signed)
Problem: Problem: Cardiovascular Progression Goal: NO BP ELEVATION Outcome: Not Progressing Patient transferred to 2900 for IV Labetolol drip to better control hypertension.

## 2011-12-18 DIAGNOSIS — I10 Essential (primary) hypertension: Secondary | ICD-10-CM

## 2011-12-18 DIAGNOSIS — K56609 Unspecified intestinal obstruction, unspecified as to partial versus complete obstruction: Secondary | ICD-10-CM

## 2011-12-18 DIAGNOSIS — I714 Abdominal aortic aneurysm, without rupture: Secondary | ICD-10-CM

## 2011-12-18 LAB — BASIC METABOLIC PANEL
BUN: 17 mg/dL (ref 6–23)
CO2: 16 mEq/L — ABNORMAL LOW (ref 19–32)
Calcium: 8.5 mg/dL (ref 8.4–10.5)
Chloride: 105 mEq/L (ref 96–112)
Creatinine, Ser: 1.73 mg/dL — ABNORMAL HIGH (ref 0.50–1.35)

## 2011-12-18 LAB — GLUCOSE, CAPILLARY
Glucose-Capillary: 133 mg/dL — ABNORMAL HIGH (ref 70–99)
Glucose-Capillary: 238 mg/dL — ABNORMAL HIGH (ref 70–99)
Glucose-Capillary: 283 mg/dL — ABNORMAL HIGH (ref 70–99)

## 2011-12-18 LAB — CBC
HCT: 34.4 % — ABNORMAL LOW (ref 39.0–52.0)
MCH: 29.9 pg (ref 26.0–34.0)
MCV: 85.6 fL (ref 78.0–100.0)
Platelets: 181 10*3/uL (ref 150–400)
RBC: 4.02 MIL/uL — ABNORMAL LOW (ref 4.22–5.81)

## 2011-12-18 MED ORDER — POTASSIUM CHLORIDE 10 MEQ/100ML IV SOLN
10.0000 meq | INTRAVENOUS | Status: AC
  Administered 2011-12-18 (×4): 10 meq via INTRAVENOUS
  Filled 2011-12-18: qty 400

## 2011-12-18 MED ORDER — CLONIDINE HCL 0.2 MG/24HR TD PTWK
0.2000 mg | MEDICATED_PATCH | TRANSDERMAL | Status: DC
  Administered 2011-12-18: 0.2 mg via TRANSDERMAL
  Filled 2011-12-18: qty 1

## 2011-12-18 MED ORDER — LABETALOL HCL 100 MG PO TABS
100.0000 mg | ORAL_TABLET | Freq: Two times a day (BID) | ORAL | Status: DC
  Administered 2011-12-18 – 2011-12-22 (×9): 100 mg via ORAL
  Filled 2011-12-18 (×11): qty 1

## 2011-12-18 NOTE — Progress Notes (Signed)
Subjective: Feels ok. No n/v. No abd pain. Removed NG late yesterday pm. Denies flatus and/or BM  Objective: Vital signs in last 24 hours: Temp:  [98.2 F (36.8 C)-98.7 F (37.1 C)] 98.2 F (36.8 C) (01/15 0800) Pulse Rate:  [66-84] 84  (01/15 0800) Resp:  [16-27] 21  (01/15 0800) BP: (131-190)/(73-104) 146/73 mmHg (01/15 0800) SpO2:  [89 %-97 %] 95 % (01/15 0800)   Intake/Output from previous day: 01/14 0701 - 01/15 0700 In: 750 [I.V.:750] Out: 1675 [Urine:1675] Intake/Output this shift: Total I/O In: 200 [I.V.:200] Out: 150 [Urine:150]  Alert, nad Soft, nt, nd. +bs  Lab Results:   Basename 12/18/11 0430 12/17/11 0500  WBC 10.4 11.3*  HGB 12.0* 12.3*  HCT 34.4* 36.3*  PLT 181 177   BMET  Basename 12/18/11 0430 12/17/11 0500  NA 139 140  K 3.3* 3.6  CL 105 107  CO2 16* 19  GLUCOSE 131* 115*  BUN 17 18  CREATININE 1.73* 1.93*  CALCIUM 8.5 9.1   CMET    Component Value Date/Time   NA 139 12/18/2011 0430   K 3.3* 12/18/2011 0430   CL 105 12/18/2011 0430   CO2 16* 12/18/2011 0430   BUN 17 12/18/2011 0430   CREATININE 1.73* 12/18/2011 0430   GLUCOSE 131* 12/18/2011 0430   CALCIUM 8.5 12/18/2011 0430   AST 14 12/10/2011 2339   ALT 11 12/10/2011 2339   ALKPHOS 102 12/10/2011 2339   BILITOT 0.2* 12/10/2011 2339   PROT 8.3 12/10/2011 2339   ALBUMIN 3.7 12/10/2011 2339   PT/INR No results found for this basename: LABPROT:2,INR:2 in the last 72 hours   Studies/Results: Dg Abd 2 Views  12-21-2011  *RADIOLOGY REPORT*  Clinical Data: Partial small bowel obstruction follow-up.  ABDOMEN - 2 VIEW  Comparison: CT of earlier in the morning at 0134 hours.  Findings: 1146 hours.  Nasogastric terminates at the gastroesophageal junction, with the side port above the GE junction.  The upright view demonstrates numerous small bowel air fluid levels, without convincing evidence of free intraperitoneal air.  Small bowel dilatation persists.  Example loop measures 4.5 cm versus 3.2 cm on  the the CT of earlier today.  Remains gas within the colon.  No pneumatosis.  Distal gas identified.  Phleboliths in the pelvis.  IMPRESSION:  1.  Persistent likely minimally progressive partial small bowel obstruction. 2.  No free intraperitoneal air or other acute complication. 3.  Nasogastric tube has been retracted to the GE junction. Consider advancement.  This study was made a "call report."  Original Report Authenticated By: Consuello Bossier, M.D.   Dg Abd Portable 2v  12/17/2011  *RADIOLOGY REPORT*  Clinical Data: Abdominal pain, possible small bowel obstruction.  PORTABLE ABDOMEN - 2 VIEW  Comparison: 12/17/2011  Findings: NG tube is in stable position within the descending duodenum.  No evidence of bowel obstruction.  Gas within nondistended large and small bowel.  No free air.  No organomegaly.  IMPRESSION: NG tube remains in the descending duodenum.  No evidence of bowel obstruction currently.  Original Report Authenticated By: Cyndie Chime, M.D.   Dg Abd Portable 2v  12/17/2011  *RADIOLOGY REPORT*  Clinical Data: Evaluate small bowel obstruction  PORTABLE ABDOMEN - 2 VIEW  Comparison: 21-Dec-2011; 12/15/2011; CT abdomen pelvis - December 21, 2011  Findings:  Grossly unchanged mild gaseous distension of several loops of small bowel in the left upper abdomen.  Air is seen within the cecum and ascending colon.  Interval advancement of enteric  tube with tip now projecting over the expected location of the descending portion of the duodenum.  No definite pneumo peritoneum, pneumatosis or portal venous gas.  Limited visualization of the lower thorax suggests left basilar heterogeneous opacities, possibly atelectasis. Surgical clips are seen overlying the left mid lower abdomen. Lumbar spine degenerative change.  IMPRESSION: Grossly unchanged findings suggestive of suggestive of at least a partial small bowel obstruction.  Original Report Authenticated By: Waynard Reeds, M.D.     Anti-infectives: Anti-infectives    None        Assessment/Plan: psbo - improving. HTN DM  Start clear liquid diet  Mary Sella. Andrey Campanile, MD, FACS General, Bariatric, & Minimally Invasive Surgery [facility], [place]   LOS: 8 days    Atilano Ina 12/18/2011

## 2011-12-18 NOTE — Progress Notes (Signed)
   CARE MANAGEMENT NOTE 12/18/2011  Patient:  [name], [name]   Account Number:  [id number]  Date Initiated:  12/14/2011  Documentation initiated by:  Donn Pierini  Subjective/Objective Assessment:   Pt admitted with DKA,     Action/Plan:   PTA pt lived lived at home with family, was independent with ADLs   Anticipated DC Date:  12/17/2011   Anticipated DC Plan:  HOME/SELF CARE      DC Planning Services  CM consult      Choice offered to / List presented to:             Status of service:  In process, will continue to follow Medicare Important Message given?   (If response is "NO", the following Medicare IM given date fields will be blank) Date Medicare IM given:   Date Additional Medicare IM given:    Discharge Disposition:    Per UR Regulation:    Comments:  12/18/11 Audreana Hancox,RN,BSN 1152 MET WITH PT TO DISCUSS DC PLANS.  PTA, PT LIVES WITH SISTER.  HE USES A CANE TO AMBULATE, AND HAS A RW IF NEEDED AT HOME.  SISTER TO PROVIDE CARE AT DC.  WILL FOLLOW FOR HOME NEEDS AS PT PROGRESSES.  PT STARTED ON CL DIET THIS AM, AND IS TOLERATING. Phone #[phone]   12/14/11- 1530- Donn Pierini RN, BSN [phone] Pt now with SBO, CM to follow for potential d/c needs

## 2011-12-18 NOTE — Plan of Care (Signed)
Problem: Hypertension, not controlled with labetalol prn  Assessment: Patient was transferred to the ICU to control BP with nicardipine drip. Drip started at 5 mg/hr, was not titrated and achieved adequate blood pressure control. Now off the drip and BP remains stable. Additionally, added clonidine patch and po labetalol.   Plan: -Continue anti-hypertensives -Cardene drip now off -Transfer to SDU, [facility] will accept patient

## 2011-12-18 NOTE — Progress Notes (Signed)
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  Began complaining of abdominal pain with nausea/vomiting on 1/10, KUB c/w mid small bowel obstruction.  Pt was maintained NPO and NGT placed.  1/12 had not improved and CCS was consulted and recommended conservative management.  CT of the abdomen / pelvis demonstrated Mid small bowel obstruction secondary to likely an adhesion in the upper central pelvis. Prior repair of a large distal abdominal aortic aneurysm. Interval increase in aneurysmal dilatation of the proximal abdominal aorta up to 4.9 x 4.8 cm in size. Interval right renal atrophy. Bibasilar atelectasis. Small left pleural and pericardial effusions. Right inguinal hernia containing fat. Diffuse colonic diverticulosis.  Concurrently, pts BP became difficult to control as he is NPO and required IV Labetalol gtt with tx to ICU for management.  PCCM consulted for BP management 1/14.    Lines / Drains: 1/10 NGT>>>1/14  Cultures: 1/18 BCx2>>>neg 1/10 UC>>>neg  Antibiotics: none   Tests / Events: 1/10 KUB>>>There are several dilated small bowel loops with differential air-fluid levels in the left upper quadrant, worrisome for a partial mechanical small-bowel obstruction. No pneumoperitoneum.  1/13 CT ABD/PELVIS>>>Mid small bowel obstruction secondary to likely an adhesion in the upper central pelvis as above. Prior repair of a large distal abdominal aortic aneurysm. Interval increase in aneurysmal dilatation of the proximal abdominal aorta up to 4.9 x 4.8 cm in size. Interval right renal atrophy. Bibasilar atelectasis. Small left pleural and pericardial effusions. Right inguinal hernia containing fat. Diffuse colonic diverticulosis.   Vital Signs: Temp:  [98.2 F (36.8 C)-98.8 F (37.1 C)]  98.2 F (36.8 C) (01/15 0400) Pulse Rate:  [66-84] 84  (01/15 0700) Resp:  [16-27] 23  (01/15 0700) BP: (131-190)/(77-104) 146/84 mmHg (01/15 0700) SpO2:  [89 %-97 %] 94 % (01/15 0700) I/O last 3 completed shifts: In: 1432.9 [I.V.:1432.9] Out: 2425 [Urine:1675; Emesis/NG output:750]  Physical Examination: General: chronically ill, awake/alert Neuro: AAOx4, speech clear, MAE CV: S1S2 rrr, no m/r/g PULM: resp's even/non-labored, lungs bilaterally clear WU:JWJXB/JYNW, NGT in place, BSx4 hypoactive Extremities:  Warm/dry, no edema, no rash or lesions  Labs    CBC  Lab 12/18/11 0430 12/17/11 0500 12/15/11 0520  HGB 12.0* 12.3* 12.0*  HCT 34.4* 36.3* 34.6*  WBC 10.4 11.3* 12.1*  PLT 181 177 149*     BMET  Lab 12/18/11 0430 12/17/11 0500 12/16/11 0839 12/15/11 0520 12/14/11 0657  NA 139 140 138 140 141  K 3.3* 3.6 -- -- --  CL 105 107 104 111 110  CO2 16* 19 19 19 20   GLUCOSE 131* 115* 167* 171* 156*  BUN 17 18 16 16 16   CREATININE 1.73* 1.93* 1.87* 1.96* 1.83*  CALCIUM 8.5 9.1 9.1 8.4 8.7  MG -- -- -- -- --  PHOS -- -- -- -- --    Radiology: see above  Assessment and Plan:  HYPERTENSION Assessment: On labetalol, diltiazem, clonodine and lisinopril at home.  Was NPO and BP difficult to manage, but now ok to start clears and meds. . Started on Cardene gtt at set rate, and BP remained stable in 130s systolic. Plan: - wean cardene gtt with scheduled beta blocker push to avoid reflex tachycardia. 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  Plan: -Strict BP / HR control as above -Dr. Imogene Burn following   SBO Assessment: see CT results Plan:  -CCS following -NPO >> starting  clears 1/15   Hyperlipidemia  Plan: -will need to restart home meds when able  Hypokalemia - 2/2 SBO. Plan: -Replete  Best practices / Disposition: -->Code Status: Full Code -->DVT Px: SCD's -->GI Px: protonix -->Diet: NPO    Amanjot Sidhu, PGY-3  12/18/2011, 7:40 AM    Pt seen and examined and database reviewed. I agree with above findings, assessment and plan Levy Pupa, MD, PhD 12/18/2011, 9:53 AM [facility] [phone] or if no answer [phone]

## 2011-12-18 NOTE — Progress Notes (Signed)
Pt. Received from unit 3900 via wheelchair. A/O VSS, family at bedside. Denies discomfort.Lurline Idol [facility]

## 2011-12-19 LAB — CBC
MCH: 29.2 pg (ref 26.0–34.0)
Platelets: 180 10*3/uL (ref 150–400)
RBC: 3.84 MIL/uL — ABNORMAL LOW (ref 4.22–5.81)

## 2011-12-19 LAB — GLUCOSE, CAPILLARY
Glucose-Capillary: 120 mg/dL — ABNORMAL HIGH (ref 70–99)
Glucose-Capillary: 125 mg/dL — ABNORMAL HIGH (ref 70–99)
Glucose-Capillary: 134 mg/dL — ABNORMAL HIGH (ref 70–99)
Glucose-Capillary: 171 mg/dL — ABNORMAL HIGH (ref 70–99)

## 2011-12-19 LAB — BASIC METABOLIC PANEL
Calcium: 8.8 mg/dL (ref 8.4–10.5)
GFR calc Af Amer: 44 mL/min — ABNORMAL LOW (ref >90)
GFR calc non Af Amer: 38 mL/min — ABNORMAL LOW (ref >90)
Glucose, Bld: 142 mg/dL — ABNORMAL HIGH (ref 70–99)
Sodium: 137 mEq/L (ref 135–145)

## 2011-12-19 MED ORDER — DILTIAZEM HCL ER BEADS 240 MG PO CP24
360.0000 mg | ORAL_CAPSULE | Freq: Every day | ORAL | Status: DC
  Administered 2011-12-19 – 2011-12-22 (×4): 360 mg via ORAL
  Filled 2011-12-19 (×5): qty 1

## 2011-12-19 MED ORDER — POTASSIUM CHLORIDE CRYS ER 20 MEQ PO TBCR
20.0000 meq | EXTENDED_RELEASE_TABLET | Freq: Every day | ORAL | Status: DC
  Filled 2011-12-19: qty 1

## 2011-12-19 MED ORDER — VITAMIN B-12 1000 MCG PO TABS
1000.0000 ug | ORAL_TABLET | Freq: Every day | ORAL | Status: DC
  Administered 2011-12-20 – 2011-12-29 (×10): 1000 ug via ORAL
  Filled 2011-12-19 (×10): qty 1

## 2011-12-19 MED ORDER — INSULIN GLARGINE 100 UNIT/ML ~~LOC~~ SOLN
12.0000 [IU] | Freq: Every day | SUBCUTANEOUS | Status: DC
  Administered 2011-12-19 – 2011-12-23 (×5): 12 [IU] via SUBCUTANEOUS

## 2011-12-19 MED ORDER — CLONIDINE HCL 0.2 MG PO TABS
0.2000 mg | ORAL_TABLET | Freq: Three times a day (TID) | ORAL | Status: DC
  Administered 2011-12-19 – 2011-12-22 (×9): 0.2 mg via ORAL
  Filled 2011-12-19 (×13): qty 1

## 2011-12-19 NOTE — Progress Notes (Signed)
Patient discussed at the Long Length of Stay Chet Greenley Weeks 12/19/2011  

## 2011-12-19 NOTE — Progress Notes (Signed)
  Subjective: Tol clears, no N/V States he's been passing flatus and had a good BM   Objective: Vital signs in last 24 hours: Temp:  [98.1 F (36.7 C)-99.4 F (37.4 C)] 98.2 F (36.8 C) (01/16 0400) Pulse Rate:  [55-89] 79  (01/16 0400) Resp:  [20-27] 25  (01/16 0357) BP: (138-156)/(70-95) 147/82 mmHg (01/16 0357) SpO2:  [75 %-97 %] 97 % (01/16 0357) Weight:  [75 kg (165 lb 5.5 oz)] 75 kg (165 lb 5.5 oz) (01/16 0537) Last BM Date: 12/13/11  Intake/Output this shift:    Physical Exam: BP 147/82  Pulse 79  Temp(Src) 98.2 F (36.8 C) (Oral)  Resp 25  Ht 6\' 3"  (1.905 m)  Wt 75 kg (165 lb 5.5 oz)  BMI 20.67 kg/m2  SpO2 97% Abdomen: soft, ND, active BS  Labs: CBC  Basename 12/19/11 0530 12/18/11 0430  WBC 10.2 10.4  HGB 11.2* 12.0*  HCT 32.3* 34.4*  PLT 180 181   BMET  Basename 12/19/11 0530 12/18/11 0430  NA 137 139  K 3.6 3.3*  CL 106 105  CO2 22 16*  GLUCOSE 142* 131*  BUN 16 17  CREATININE 1.84* 1.73*  CALCIUM 8.8 8.5   LFT No results found for this basename: PROT,ALBUMIN,AST,ALT,ALKPHOS,BILITOT,BILIDIR,IBILI,LIPASE in the last 72 hours PT/INR No results found for this basename: LABPROT:2,INR:2 in the last 72 hours ABG No results found for this basename: PHART:2,PCO2:2,PO2:2,HCO3:2 in the last 72 hours  Studies/Results: Dg Abd Portable 2v  12/17/2011  *RADIOLOGY REPORT*  Clinical Data: Abdominal pain, possible small bowel obstruction.  PORTABLE ABDOMEN - 2 VIEW  Comparison: 12/17/2011  Findings: NG tube is in stable position within the descending duodenum.  No evidence of bowel obstruction.  Gas within nondistended large and small bowel.  No free air.  No organomegaly.  IMPRESSION: NG tube remains in the descending duodenum.  No evidence of bowel obstruction currently.  Original Report Authenticated By: Cyndie Chime, M.D.    Assessment: Principal Problem:  *DKA (diabetic ketoacidoses) Active Problems:  HYPERTENSION  ABDOMINAL AORTIC ANEURYSM  REPAIR, HX OF  Hyperlipidemia     Plan: Resolving PSBO Advance diet as tolerated. Call if needed.  LOS: 9 days    [name] 12/19/2011

## 2011-12-19 NOTE — Progress Notes (Signed)
Inpatient Diabetes Program Recommendations  AACE/ADA: New Consensus Statement on Inpatient Glycemic Control (2009)  Target Ranges:  Prepandial:   less than 140 mg/dL      Peak postprandial:   less than 180 mg/dL (1-2 hours)      Critically ill patients:  140 - 180 mg/dL   Inpatient Diabetes Program Recommendations Insulin - Basal: Increase Lantus to 10 units  Insulin - Meal Coverage: . HgbA1C: A1c=10.8% indicating average glucose 264 mg/dL.

## 2011-12-19 NOTE — Progress Notes (Signed)
Patient transferred to 52.

## 2011-12-19 NOTE — Progress Notes (Signed)
[facility] [place] TEAM 8  Subjective: ASSUMPTION OF CARE NOTE [facility] is assuming care of this pt from PCCM as of today.  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 Began complaining of abdominal pain with nausea/vomiting on 1/10 - KUB c/w mid small bowel obstruction. Pt was maintained NPO and NGT placed. 1/12 had not improved and CCS was consulted and recommended conservative management. CT of the abdomen / pelvis demonstrated mid small bowel obstruction secondary to likely an adhesion in the upper central pelvis.  Concurrently, pts BP became difficult to control as he was NPO and required IV Labetalol gtt with tx to ICU for management. PCCM consulted for BP management 1/14.   At the time of my exam today the pt is resting comfortably.  He denies f/c, sob, n/v, or abdom pain.  He is tolerating his clear liquids thus far.    Lines / Drains:  1/10 NGT>>>1/14  Cultures:  1/18 BCx2>>>neg  1/10 UC>>>neg   Objective: Weight change:   Intake/Output Summary (Last 24 hours) at 12/19/11 1127 Last data filed at 12/19/11 0700  Gross per 24 hour  Intake   1520 ml  Output   1000 ml  Net    520 ml   Blood pressure 148/81, pulse 73, temperature 97.6 F (36.4 C), temperature source Oral, resp. rate 21, height 6\' 3"  (1.905 m), weight 75 kg (165 lb 5.5 oz), SpO2 95.00%.  Physical Exam: General: No acute respiratory distress Lungs: Clear to auscultation bilaterally without wheezes or crackles Cardiovascular: Regular rate and rhythm without murmur gallop or rub normal S1 and S2 Abdomen: Nontender, nondistended, soft, bowel sounds positive, no rebound, no ascites, no appreciable mass Extremities: No significant cyanosis, clubbing, or edema bilateral lower extremities  Lab Results:  Basename 12/19/11 0530 12/18/11 0430 12/17/11 0500  NA 137 139 140  K 3.6 3.3* 3.6  CL 106 105 107  CO2 22 16* 19    GLUCOSE 142* 131* 115*  BUN 16 17 18   CREATININE 1.84* 1.73* 1.93*  CALCIUM 8.8 8.5 9.1  MG -- -- --  PHOS -- -- --    Basename 12/19/11 0530 12/18/11 0430 12/17/11 0500  WBC 10.2 10.4 11.3*  NEUTROABS -- -- --  HGB 11.2* 12.0* 12.3*  HCT 32.3* 34.4* 36.3*  MCV 84.1 85.6 86.6  PLT 180 181 177   Micro Results: Recent Results (from the past 240 hour(s))  URINE CULTURE     Status: Normal   Collection Time   12/11/11 12:55 PM      Component Value Range Status Comment   Specimen Description URINE, CLEAN CATCH   Final    Special Requests NONE   Final    Setup Time [id number]   Final    Colony Count NO GROWTH   Final    Culture NO GROWTH   Final    Report Status 12/12/2011 FINAL   Final   CULTURE, BLOOD (ROUTINE X 2)     Status: Normal   Collection Time   12/11/11  1:23 PM      Component Value Range Status Comment   Specimen Description BLOOD LEFT ANTECUBITAL   Final    Special Requests BOTTLES DRAWN AEROBIC AND ANAEROBIC 10CC   Final    Setup Time [id number]   Final    Culture NO GROWTH 5 DAYS   Final    Report Status 12/17/2011 FINAL  Final   CULTURE, BLOOD (ROUTINE X 2)     Status: Normal   Collection Time   12/11/11  1:29 PM      Component Value Range Status Comment   Specimen Description BLOOD LEFT HAND   Final    Special Requests BOTTLES DRAWN AEROBIC AND ANAEROBIC 10CC   Final    Setup Time [id number]   Final    Culture NO GROWTH 5 DAYS   Final    Report Status 12/17/2011 FINAL   Final   URINE CULTURE     Status: Normal   Collection Time   12/13/11 12:55 PM      Component Value Range Status Comment   Specimen Description URINE, CLEAN CATCH   Final    Special Requests NONE   Final    Setup Time [id number]   Final    Colony Count NO GROWTH   Final    Culture NO GROWTH   Final    Report Status 12/14/2011 FINAL   Final   MRSA PCR SCREENING     Status: Normal   Collection Time   12/17/11  2:13 PM      Component Value Range Status Comment   MRSA by PCR  NEGATIVE  NEGATIVE  Final     Studies/Results: All recent x-ray/radiology reports have been reviewed in detail.   Medications: I have reviewed the patient's complete medication list.  Assessment/Plan:  Malignant HTN BP much improved at this time - no change in tx plan today - cont to follow   PSBO Gen Surg following - beginning clear liquid diet - tolerating well thus far  DM w/ DKA at presentation CBGs are now reasonably controlled  ABDOMINAL AORTIC ANEURYSM w/ hx of repair  Hyperlipidemia  Hypokalemia Improving, but goal is K+>4.0 - will cont to supplement and follow  Acute renal failure on chronic kidney disease  crt is now back to his probable baseline  Tobacco abuse Has been advised to d/c tobacco use completely  Lonia Blood, MD [facility] Office  [phone] Pager [phone]  On-Call/Text Page:      [email or web address]      password [facility]

## 2011-12-19 NOTE — Progress Notes (Signed)
Report given to [facility], RN on unit 6700; patient provided with an update re: room assignment [phone].

## 2011-12-20 LAB — BASIC METABOLIC PANEL
CO2: 20 mEq/L (ref 19–32)
Calcium: 8.5 mg/dL (ref 8.4–10.5)
Creatinine, Ser: 1.84 mg/dL — ABNORMAL HIGH (ref 0.50–1.35)
GFR calc non Af Amer: 38 mL/min — ABNORMAL LOW (ref >90)
Glucose, Bld: 128 mg/dL — ABNORMAL HIGH (ref 70–99)

## 2011-12-20 MED ORDER — INSULIN ASPART 100 UNIT/ML ~~LOC~~ SOLN
2.0000 [IU] | Freq: Three times a day (TID) | SUBCUTANEOUS | Status: DC
  Administered 2011-12-20: 2 [IU] via SUBCUTANEOUS
  Administered 2011-12-21: 4 [IU] via SUBCUTANEOUS
  Administered 2011-12-21: 6 [IU] via SUBCUTANEOUS
  Administered 2011-12-21: 2 [IU] via SUBCUTANEOUS
  Administered 2011-12-22: 6 [IU] via SUBCUTANEOUS
  Administered 2011-12-22: 4 [IU] via SUBCUTANEOUS
  Administered 2011-12-22: 6 [IU] via SUBCUTANEOUS
  Administered 2011-12-23: 2 [IU] via SUBCUTANEOUS

## 2011-12-20 MED ORDER — POTASSIUM CHLORIDE CRYS ER 20 MEQ PO TBCR
40.0000 meq | EXTENDED_RELEASE_TABLET | Freq: Every day | ORAL | Status: DC
  Administered 2011-12-20 – 2011-12-22 (×3): 40 meq via ORAL
  Filled 2011-12-20: qty 2
  Filled 2011-12-20: qty 1
  Filled 2011-12-20: qty 2

## 2011-12-20 NOTE — Progress Notes (Signed)
Ulah Olmo M. Odarius Dines, MD, FACS General, Bariatric, & Minimally Invasive [facility]  

## 2011-12-20 NOTE — Progress Notes (Signed)
Subjective: Patient seen and examined this am. Denies any symptoms  Objective:  Vital signs in last 24 hours:  Filed Vitals:   12/20/11 0520 12/20/11 1000 12/20/11 1400 12/20/11 1800  BP: 155/87 145/80 160/92 164/90  Pulse: 71 67 71 62  Temp: 98.7 F (37.1 C) 99 F (37.2 C) 98.8 F (37.1 C) 99 F (37.2 C)  TempSrc: Oral Oral Oral Oral  Resp: 18 18 18 18   Height:      Weight:      SpO2: 92% 93% 94% 93%    Intake/Output from previous day:   Intake/Output Summary (Last 24 hours) at 12/20/11 1856 Last data filed at 12/20/11 1700  Gross per 24 hour  Intake   1080 ml  Output   1601 ml  Net   -521 ml    Physical Exam:  General: elderly male  in no acute distress. HEENT: no pallor, no icterus, moist oral mucosa, no JVD, no lymphadenopathy Heart: Normal  s1 &s2  Regular rate and rhythm, without murmurs, rubs, gallops. Lungs: Clear to auscultation bilaterally. Abdomen: Soft, nontender, nondistended, positive bowel sounds. Extremities: No clubbing cyanosis or edema with positive pedal pulses. Neuro: Alert, awake, oriented x3, nonfocal.   Lab Results:  Basic Metabolic Panel:    Component Value Date/Time   NA 139 12/20/2011 0630   K 3.4* 12/20/2011 0630   CL 107 12/20/2011 0630   CO2 20 12/20/2011 0630   BUN 14 12/20/2011 0630   CREATININE 1.84* 12/20/2011 0630   GLUCOSE 128* 12/20/2011 0630   CALCIUM 8.5 12/20/2011 0630   CBC:    Component Value Date/Time   WBC 10.2 12/19/2011 0530   HGB 11.2* 12/19/2011 0530   HCT 32.3* 12/19/2011 0530   PLT 180 12/19/2011 0530   MCV 84.1 12/19/2011 0530   NEUTROABS 7.6 12/12/2011 0530   LYMPHSABS 4.3* 12/12/2011 0530   MONOABS 0.8 12/12/2011 0530   EOSABS 0.4 12/12/2011 0530   BASOSABS 0.0 12/12/2011 0530    Recent Results (from the past 240 hour(s))  URINE CULTURE     Status: Normal   Collection Time   12/11/11 12:55 PM      Component Value Range Status Comment   Specimen Description URINE, CLEAN CATCH   Final    Special Requests NONE    Final    Setup Time [id number]   Final    Colony Count NO GROWTH   Final    Culture NO GROWTH   Final    Report Status 12/12/2011 FINAL   Final   CULTURE, BLOOD (ROUTINE X 2)     Status: Normal   Collection Time   12/11/11  1:23 PM      Component Value Range Status Comment   Specimen Description BLOOD LEFT ANTECUBITAL   Final    Special Requests BOTTLES DRAWN AEROBIC AND ANAEROBIC 10CC   Final    Setup Time [id number]   Final    Culture NO GROWTH 5 DAYS   Final    Report Status 12/17/2011 FINAL   Final   CULTURE, BLOOD (ROUTINE X 2)     Status: Normal   Collection Time   12/11/11  1:29 PM      Component Value Range Status Comment   Specimen Description BLOOD LEFT HAND   Final    Special Requests BOTTLES DRAWN AEROBIC AND ANAEROBIC 10CC   Final    Setup Time [id number]   Final    Culture NO GROWTH 5 DAYS   Final  Report Status 12/17/2011 FINAL   Final   URINE CULTURE     Status: Normal   Collection Time   12/13/11 12:55 PM      Component Value Range Status Comment   Specimen Description URINE, CLEAN CATCH   Final    Special Requests NONE   Final    Setup Time [id number]   Final    Colony Count NO GROWTH   Final    Culture NO GROWTH   Final    Report Status 12/14/2011 FINAL   Final   MRSA PCR SCREENING     Status: Normal   Collection Time   12/17/11  2:13 PM      Component Value Range Status Comment   MRSA by PCR NEGATIVE  NEGATIVE  Final     Studies/Results: No results found.  Medications: Scheduled Meds:   . cloNIDine  0.2 mg Oral TID  . diltiazem  360 mg Oral Daily  . insulin aspart  0-15 Units Subcutaneous Q4H  . insulin glargine  12 Units Subcutaneous QHS  . labetalol  100 mg Oral BID  . nicotine  21 mg Transdermal Daily  . potassium chloride  40 mEq Oral Daily  . vitamin B-12  1,000 mcg Oral Daily  . DISCONTD: potassium chloride  20 mEq Oral Daily   Continuous Infusions:  PRN Meds:.dextrose, morphine injection, ondansetron (ZOFRAN)  IV  Assessment  61 y/o Male  with PMHx of DM, HTN, CVA with residual weakness, abdominal aneurysm s/p repair was admitted on 1/8 with DKA. On 1/10 patient c/o  abdominal pain with nausea/vomiting , KUB showed SBO. [place] sx  was consulted and recommended conservative management. CT of the abdomen / pelvis demonstrated mid small bowel obstruction secondary to likely an adhesion in the upper central pelvis. At the same time, his  BP became difficult to control as he was NPO and required IV Labetalol gtt with tx to ICU for management. Patient now improved and transferred to medial floor.  Plan:  Malignant HTN  BP much improved now on current medications. Currently on cardizem, clonidine and labetalol  Partial SBO   Surg following  Started clears and tolerating well, will advance as tolerated  sx signed off  DM w/ DKA on presentation  CBGs are now well controlled   ABDOMINAL AORTIC ANEURYSM w/ hx of repair  Stable   Hypokalemia  Being replenished with kcl  AKI on  chronic kidney disease  Creatinine  is now back to  baseline    DVT prophylaxis: SCD  PT eval   LOS: 10 days   Marckus Hanover 12/20/2011, 6:56 PM

## 2011-12-20 NOTE — Progress Notes (Signed)
Pt had an episode of vomiting provided him with ginger ale and saltine crackers. Sister stated he needs his food chopped due to not having all of his teeth. Annitta Needs, RN

## 2011-12-21 ENCOUNTER — Inpatient Hospital Stay (HOSPITAL_COMMUNITY): Payer: Medicare Other

## 2011-12-21 LAB — GLUCOSE, CAPILLARY
Glucose-Capillary: 157 mg/dL — ABNORMAL HIGH (ref 70–99)
Glucose-Capillary: 195 mg/dL — ABNORMAL HIGH (ref 70–99)
Glucose-Capillary: 262 mg/dL — ABNORMAL HIGH (ref 70–99)
Glucose-Capillary: 202 mg/dL — ABNORMAL HIGH (ref 70–99)

## 2011-12-21 LAB — BASIC METABOLIC PANEL
BUN: 21 mg/dL (ref 6–23)
CO2: 22 mEq/L (ref 19–32)
Glucose, Bld: 175 mg/dL — ABNORMAL HIGH (ref 70–99)
Potassium: 3.9 mEq/L (ref 3.5–5.1)
Sodium: 139 mEq/L (ref 135–145)

## 2011-12-21 MED ORDER — LEVOFLOXACIN IN D5W 750 MG/150ML IV SOLN
750.0000 mg | INTRAVENOUS | Status: DC
  Administered 2011-12-21 – 2011-12-23 (×3): 750 mg via INTRAVENOUS
  Filled 2011-12-21 (×4): qty 150

## 2011-12-21 MED ORDER — HYDRALAZINE HCL 20 MG/ML IJ SOLN
10.0000 mg | Freq: Four times a day (QID) | INTRAMUSCULAR | Status: DC | PRN
  Filled 2011-12-21: qty 0.5

## 2011-12-21 NOTE — Progress Notes (Signed)
Pt complained of nausea. No complaints of stomach pain. Zofran given. Will continue to monitor pt. [name], [name]

## 2011-12-21 NOTE — Progress Notes (Signed)
Subjective: Patient seen and examined this am. Was noted to desat to 80s and placed on [place]. Also has few episodes of vomiting overnight. Also had low grade temp this am. Objective:  Vital signs in last 24 hours:  Filed Vitals:   12/21/11 0922 12/21/11 0927 12/21/11 0930 12/21/11 1411  BP:    139/92  Pulse:    81  Temp:    98.3 F (36.8 C)  TempSrc:    Oral  Resp:    16  Height:      Weight:      SpO2: 88% 89% 90% 93%    Intake/Output from previous day:   Intake/Output Summary (Last 24 hours) at 12/21/11 1751 Last data filed at 12/21/11 0900  Gross per 24 hour  Intake    240 ml  Output    202 ml  Net     38 ml    Physical Exam:  General: elderly male in no acute distress.  HEENT: no pallor, no icterus, moist oral mucosa, no JVD, no lymphadenopathy  Heart: Normal s1 &s2 Regular rate and rhythm, without murmurs, rubs, gallops.  Lungs: Clear to auscultation bilaterally.  Abdomen: Soft, nontender, distended, positive bowel sounds.  Extremities: No clubbing cyanosis or edema with positive pedal pulses.  Neuro: Alert, awake, oriented x3, nonfocal.   Lab Results:  Basic Metabolic Panel:    Component Value Date/Time   NA 139 12/21/2011 0540   K 3.9 12/21/2011 0540   CL 102 12/21/2011 0540   CO2 22 12/21/2011 0540   BUN 21 12/21/2011 0540   CREATININE 2.34* 12/21/2011 0540   GLUCOSE 175* 12/21/2011 0540   CALCIUM 9.4 12/21/2011 0540   CBC:    Component Value Date/Time   WBC 10.2 12/19/2011 0530   HGB 11.2* 12/19/2011 0530   HCT 32.3* 12/19/2011 0530   PLT 180 12/19/2011 0530   MCV 84.1 12/19/2011 0530   NEUTROABS 7.6 12/12/2011 0530   LYMPHSABS 4.3* 12/12/2011 0530   MONOABS 0.8 12/12/2011 0530   EOSABS 0.4 12/12/2011 0530   BASOSABS 0.0 12/12/2011 0530    Recent Results (from the past 240 hour(s))  URINE CULTURE     Status: Normal   Collection Time   12/13/11 12:55 PM      Component Value Range Status Comment   Specimen Description URINE, CLEAN CATCH   Final    Special  Requests NONE   Final    Setup Time [id number]   Final    Colony Count NO GROWTH   Final    Culture NO GROWTH   Final    Report Status 12/14/2011 FINAL   Final   MRSA PCR SCREENING     Status: Normal   Collection Time   12/17/11  2:13 PM      Component Value Range Status Comment   MRSA by PCR NEGATIVE  NEGATIVE  Final     Studies/Results: Dg Chest Port 1 View  12/21/2011  *RADIOLOGY REPORT*  Clinical Data: Shortness of breath, fever  PORTABLE CHEST - 1 VIEW  Comparison: Portable chest x-ray of 12/14/2011  Findings: There has been an increase in opacities at the lung bases consistent with either atelectasis or pneumonia.  There is cardiomegaly present.  No pleural effusion is seen.  No bony abnormality is noted.  IMPRESSION: Increase in basilar opacities consistent with atelectasis or pneumonia.  Stable cardiomegaly.  Original Report Authenticated By: Juline Patch, M.D.   Dg Abd Portable 1v  12/21/2011  *RADIOLOGY REPORT*  Clinical Data:  Abdominal distention, small bowel obstruction  PORTABLE ABDOMEN - 1 VIEW  Comparison: Abdomen films of 12/17/2011 and CT abdomen pelvis of 12/16/2011  Findings: Compared to the scout film from the CT as well as plain films from 01/14, there is somewhat less gaseous distention of the small bowel.  No colonic bowel gas is seen.  IMPRESSION: Improvement in gaseous distention of small bowel.  Original Report Authenticated By: Juline Patch, M.D.    Medications: Scheduled Meds:   . cloNIDine  0.2 mg Oral TID  . diltiazem  360 mg Oral Daily  . insulin aspart  2-10 Units Subcutaneous TID AC & HS  . insulin glargine  12 Units Subcutaneous QHS  . labetalol  100 mg Oral BID  . levofloxacin (LEVAQUIN) IV  750 mg Intravenous Q24H  . nicotine  21 mg Transdermal Daily  . potassium chloride  40 mEq Oral Daily  . vitamin B-12  1,000 mcg Oral Daily  . DISCONTD: insulin aspart  0-15 Units Subcutaneous Q4H   Continuous Infusions:  PRN Meds:.dextrose, morphine  injection, ondansetron (ZOFRAN) IV   Assessment  61 y/o Male with PMHx of DM, HTN, CVA with residual weakness, abdominal aneurysm s/p repair was admitted on 1/8 with DKA. On 1/10 patient c/o abdominal pain with nausea/vomiting , KUB showed SBO. [place] sx was consulted and recommended conservative management. CT of the abdomen / pelvis demonstrated mid small bowel obstruction secondary to likely an adhesion in the upper central pelvis. At the same time, his BP became difficult to control as he was NPO and required IV Labetalol gtt with tx to ICU for management. Patient now improved and transferred to medial floor.   Plan:   Malignant HTN  BP much improved now on current medications. Currently on cardizem, clonidine and labetalol   Partial SBO  Noted to have distention of abdomen today with 2 episode of vomiting overnight. 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 repeat in am  DVT prophylaxis: SCD   PT eval    LOS: 11 days   [name] 12/21/2011, 5:51 PM

## 2011-12-21 NOTE — Progress Notes (Signed)
Educated pt on the use and purpose of incentive spirometer. Pt verbalized understanding and demonstrated correct use. Pt performed 3 successful incentive spirometry inhalations with supervision at bedside. Encouraged pt to use frequently throughout the day. [name], [name]

## 2011-12-21 NOTE — Progress Notes (Signed)
Informed that pt O2 sat 87% at morning vital sign check. Placed pt on 3 l/min nasal cannula. Patient previously not on oxygen. Breath sounds diminished; no complaints of pain or shortness of breath reported by patient. Patient O2 sat increased to only 88% on 3 l/min. Increased O2 to 4 l/min and patient O2 sat rose to 90-91%. Patient remains asymptomatic. Will notify physician and continue to monitor. [name], [name]

## 2011-12-21 NOTE — Progress Notes (Signed)
Physical Therapy Evaluation Patient Details Name: [name] MRN: [id number] DOB: 1951/11/11 Today's Date: 12/21/2011  Problem List:  Patient Active Problem List  Diagnoses  . GOUT  . HYPERTENSION  . BRADYCARDIA  . ABDOMINAL AORTIC ANEURYSM, HX OF  . ABDOMINAL AORTIC ANEURYSM REPAIR, HX OF  . APPENDECTOMY, HX OF  . INGUINAL HERNIORRHAPHY, HX OF  . DKA (diabetic ketoacidoses)  . Hyperlipidemia    Past Medical History:  Past Medical History  Diagnosis Date  . Diabetes mellitus   . Hypertension   . Aneurysm of aorta   . Aneurysm of abdominal aorta   . Stroke     CVA effected  eye  & leg  . Cataract, bilateral   . Renal disorder    Past Surgical History:  Past Surgical History  Procedure Date  . Abdominal aortic aneurysm repair   . Hernia repair     PT Assessment/Plan/Recommendation PT Assessment Clinical Impression Statement: Pt is a 61 y/o male admitted for DKA. Pt requires 4liters of Oxygen via [place] when at rest to prevent Oxygen desaturartion. Pt' s O2 sat dropped to 60 while ambulating on 4L of  O2 today.  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   PT Recommendation/Assessment: Patient will need skilled PT in the acute care venue PT Problem List: Decreased strength;Decreased activity tolerance;Decreased safety awareness;Decreased knowledge of precautions;Cardiopulmonary status limiting activity Barriers to Discharge: Decreased caregiver support Barriers to Discharge Comments: Sister is care giver but works full time.  PT Therapy Diagnosis : Difficulty walking;Generalized weakness PT Plan PT Frequency: Min 3X/week PT Treatment/Interventions: Gait training;Functional mobility training;Therapeutic activities;Therapeutic exercise;Patient/family education PT Recommendation Follow Up Recommendations: Other (comment) (To be determined.  Pt may need Short term SNF) Equipment Recommended: None  recommended by PT PT Goals  Acute Rehab PT Goals PT Goal Formulation: With patient Time For Goal Achievement: 2 weeks Pt will go Supine/Side to Sit: Independently PT Goal: Supine/Side to Sit - Progress: Goal set today Pt will go Sit to Supine/Side: Independently PT Goal: Sit to Supine/Side - Progress: Goal set today Pt will go Sit to Stand: Independently PT Goal: Sit to Stand - Progress: Goal set today Pt will go Stand to Sit: Independently PT Goal: Stand to Sit - Progress: Goal set today Pt will Transfer Bed to Chair/Chair to Bed: with modified independence PT Transfer Goal: Bed to Chair/Chair to Bed - Progress: Goal set today Pt will Ambulate: >150 feet;with modified independence;with least restrictive assistive device;Other (comment) (O2 sats >90 on room air. ) PT Goal: Ambulate - Progress: Goal set today Pt will Go Up / Down Stairs: 3-5 stairs;with modified independence;with least restrictive assistive device PT Goal: Up/Down Stairs - Progress: Goal set today  PT Evaluation Precautions/Restrictions  Restrictions Weight Bearing Restrictions: No Prior Functioning  Home Living Lives With: Family (sister) Receives Help From: Family Type of Home: House Home Layout: One level Home Access: Stairs to enter Entrance Stairs-Rails: None Entrance Stairs-Number of Steps: 3 Bathroom Shower/Tub: Forensic scientist: Standard Bathroom Accessibility: Yes How Accessible: Accessible via wheelchair;Accessible via walker Home Adaptive Equipment: Walker - rolling;Straight cane;Shower chair without back;Grab bars around toilet;Bedside commode/3-in-1 Prior Function Level of Independence: Independent with basic ADLs;Independent with gait;Independent with transfers Able to Take Stairs?: Yes Driving: Yes Vocation: On disability Leisure: Hobbies-no Cognition Cognition Orientation Level: Oriented X4 Sensation/Coordination Sensation Light Touch: Appears  Intact Stereognosis: Not tested Hot/Cold: Not tested Proprioception: Not tested Coordination  Gross Motor Movements are Fluid and Coordinated: Yes Fine Motor Movements are Fluid and Coordinated: Not tested Extremity Assessment RUE Assessment RUE Assessment: Within Functional Limits LUE Assessment LUE Assessment: Within Functional Limits RLE Assessment RLE Assessment: Exceptions to [facility] RLE Strength RLE Overall Strength: Deficits RLE Overall Strength Comments: 3+/5 gross quad strength LLE Assessment LLE Assessment: Exceptions to [facility] LLE Strength LLE Overall Strength: Deficits LLE Overall Strength Comments: 3+/5 gross quad strength Mobility (including Balance) Bed Mobility Bed Mobility: No Transfers Transfers: Yes Sit to Stand: 3: Mod assist;From bed;With upper extremity assist;From chair/3-in-1;From elevated surface;With armrests (pt 70%) Sit to Stand Details (indicate cue type and reason): verbal cues for hand placement, manual facilitation to manage pt's body weight due to general LE weakness.   Stand to Sit: 4: Min assist;To chair/3-in-1;To bed;With upper extremity assist;Patient percentage (comment) (pt 80%) Stand to Sit Details: cues for hand placement and assistance for controlled descent.  Stand Pivot Transfers: 3: Mod assist;Patient percentage (comment) (pt 70%) Stand Pivot Transfer Details (indicate cue type and reason): Assist to stabilize pt in standing. Cues for technique as pt attempts to sit prematurely. Pt required manual assistance to control descent into chair as pt just collapsed once in position to sit.  Ambulation/Gait Ambulation/Gait: Yes Ambulation/Gait Assistance: 3: Mod assist;5: Supervision Ambulation/Gait Assistance Details (indicate cue type and reason): Pt supervision for safety for most of the task.  Pt required assistance when changing direction.  Once fatigued pt had difficulty avoiding objects. Pt bumped into a dinamap with his walker, lost his balance  (leaning to his left) and was unable to recover without my assistance. At that time pt's O2 sats where 62% on 4 liters of O2 via nasal canula.  Attempted to amublate without oxygen but pt's O2 sats droped to 70 within 1 min of turning off O2.   Ambulation Distance (Feet): 200 Feet Assistive device: Rolling walker Gait Pattern: Within Functional Limits Stairs: No Wheelchair Mobility Wheelchair Mobility: No  Posture/Postural Control Posture/Postural Control: No significant limitations Balance Balance Assessed: Yes Static Sitting Balance Static Sitting - Balance Support: No upper extremity supported;Feet supported Static Sitting - Level of Assistance: 7: Independent Exercise    End of Session PT - End of Session Equipment Utilized During Treatment: Gait belt Activity Tolerance: Patient limited by fatigue;Treatment limited secondary to medical complications (Comment) (O2 sats drop with activity.) Patient left: in chair;with call bell in reach Nurse Communication: Mobility status for transfers;Mobility status for ambulation;Other (comment) (O2 drops with activity.) General Behavior During Session: [facility] for tasks performed Cognition: [facility] for tasks performed  Antion Andres 12/21/2011, 4:16 PM Calleen Alvis L. Gabryel Talamo DPT [phone]

## 2011-12-21 NOTE — Progress Notes (Signed)
Pt had 3 episodes of vomiting tonight of which he did not complain about.  He did request zofran for the nausea and vomiting and seems to have relief with this medication.  He denied any stomach or throat pain.  Will continue to monitor and pass along information to day shift.

## 2011-12-21 NOTE — Progress Notes (Signed)
Found patient sitting in bed covered in emesis. Patient stated he experienced projectile vomiting; emesis found on end of bed and on floor at end of bed. Cleaned patient and changed linen. Offered ginger ale and crackers. Will administer antiemetic when next dose is able to be given. [name], [name]

## 2011-12-22 ENCOUNTER — Inpatient Hospital Stay (HOSPITAL_COMMUNITY): Payer: Medicare Other

## 2011-12-22 LAB — GLUCOSE, CAPILLARY
Glucose-Capillary: 271 mg/dL — ABNORMAL HIGH (ref 70–99)
Glucose-Capillary: 150 mg/dL — ABNORMAL HIGH (ref 70–99)

## 2011-12-22 LAB — BASIC METABOLIC PANEL
BUN: 58 mg/dL — ABNORMAL HIGH (ref 6–23)
Calcium: 9.9 mg/dL (ref 8.4–10.5)
Chloride: 92 mEq/L — ABNORMAL LOW (ref 96–112)
GFR calc Af Amer: 13 mL/min — ABNORMAL LOW (ref >90)
GFR calc non Af Amer: 11 mL/min — ABNORMAL LOW (ref >90)
Glucose, Bld: 220 mg/dL — ABNORMAL HIGH (ref 70–99)
Potassium: 5.1 mEq/L (ref 3.5–5.1)
Sodium: 135 mEq/L (ref 135–145)

## 2011-12-22 LAB — CBC
HCT: 39.1 % (ref 39.0–52.0)
Hemoglobin: 13.5 g/dL (ref 13.0–17.0)
MCH: 29.6 pg (ref 26.0–34.0)
MCHC: 34.5 g/dL (ref 30.0–36.0)
MCV: 85.7 fL (ref 78.0–100.0)

## 2011-12-22 MED ORDER — LABETALOL HCL 100 MG PO TABS
100.0000 mg | ORAL_TABLET | Freq: Two times a day (BID) | ORAL | Status: DC
  Filled 2011-12-22 (×2): qty 1

## 2011-12-22 MED ORDER — SODIUM CHLORIDE 0.9 % IV SOLN
INTRAVENOUS | Status: DC
  Administered 2011-12-22: 13:00:00 via INTRAVENOUS
  Administered 2011-12-23: 10000 mL via INTRAVENOUS
  Administered 2011-12-24: 125 mL/h via INTRAVENOUS
  Administered 2011-12-25 – 2011-12-28 (×5): via INTRAVENOUS

## 2011-12-22 MED ORDER — DILTIAZEM HCL ER BEADS 240 MG PO CP24
360.0000 mg | ORAL_CAPSULE | Freq: Every day | ORAL | Status: DC
  Filled 2011-12-22: qty 1

## 2011-12-22 MED ORDER — IOHEXOL 300 MG/ML  SOLN
20.0000 mL | INTRAMUSCULAR | Status: AC
  Administered 2011-12-22 (×2): 20 mL via ORAL

## 2011-12-22 NOTE — Progress Notes (Addendum)
Subjective: Patient seen and examined this am. Was having few episodes of vomiting again overnight. informs of some abdominal pain. Poor urine output noted overnight and worsened renal function in am labs .RN attempted to insert a 31F  foley  And no resistance met when inserting foley, but no urine return present and expressed discomfort when attempting to inflate bulb. Foley catheter removed and performedCharge nurse performed bladder scan, which showed less than 75ml of urine. Again nurse attempted to insert foley and patient expressed discomfort when attempted to inflate bulb. Upon removing foley,  there was a large clot of old  blood. Some time later when patient stood up to go to the bathroom there was a copious amount of bleeding ( fresh) from his penis. On my evaluation there was fresh blood dripping from the meatus.  Objective:  Vital signs in last 24 hours:  Filed Vitals:   12/21/11 2246 12/22/11 0619 12/22/11 0840 12/22/11 1310  BP: 143/91 113/77 127/88 133/93  Pulse: 88 92 89 81  Temp: 98.1 F (36.7 C) 98.4 F (36.9 C) 97.6 F (36.4 C) 98 F (36.7 C)  TempSrc: Oral Oral Oral Oral  Resp: 19 18 18 18   Height:      Weight: 71.7 kg (158 lb 1.1 oz)     SpO2: 92% 95% 94% 92%    Intake/Output from previous day:   Intake/Output Summary (Last 24 hours) at 12/22/11 1733 Last data filed at 12/22/11 0730  Gross per 24 hour  Intake    240 ml  Output      2 ml  Net    238 ml    Physical Exam:  General: elderly male  in no acute distress. HEENT: no pallor, no icterus, moist oral mucosa, no JVD, no lymphadenopathy Heart: Normal  s1 &s2  Regular rate and rhythm, without murmurs, rubs, gallops. Lungs: Clear to auscultation bilaterally. Abdomen: Soft, nontender, nondistended, positive bowel sounds. Fresh blood dripping from the penile meatus Extremities: No clubbing cyanosis or edema with positive pedal pulses. Neuro: Alert, awake, oriented x3, nonfocal.   Lab Results:  Basic  Metabolic Panel:    Component Value Date/Time   NA 132* 12/22/2011 1609   K 5.1 12/22/2011 1609   CL 92* 12/22/2011 1609   CO2 20 12/22/2011 1609   BUN 58* 12/22/2011 1609   CREATININE 6.07* 12/22/2011 1609   GLUCOSE 220* 12/22/2011 1609   CALCIUM 9.1 12/22/2011 1609   CBC:    Component Value Date/Time   WBC 13.1* 12/22/2011 1609   HGB 13.5 12/22/2011 1609   HCT 39.1 12/22/2011 1609   PLT 287 12/22/2011 1609   MCV 85.7 12/22/2011 1609   NEUTROABS 7.6 12/12/2011 0530   LYMPHSABS 4.3* 12/12/2011 0530   MONOABS 0.8 12/12/2011 0530   EOSABS 0.4 12/12/2011 0530   BASOSABS 0.0 12/12/2011 0530    Recent Results (from the past 240 hour(s))  URINE CULTURE     Status: Normal   Collection Time   12/13/11 12:55 PM      Component Value Range Status Comment   Specimen Description URINE, CLEAN CATCH   Final    Special Requests NONE   Final    Setup Time [id number]   Final    Colony Count NO GROWTH   Final    Culture NO GROWTH   Final    Report Status 12/14/2011 FINAL   Final   MRSA PCR SCREENING     Status: Normal   Collection Time   12/17/11  2:13  PM      Component Value Range Status Comment   MRSA by PCR NEGATIVE  NEGATIVE  Final     Studies/Results: Dg Chest Port 1 View  12/21/2011  *RADIOLOGY REPORT*  Clinical Data: Shortness of breath, fever  PORTABLE CHEST - 1 VIEW  Comparison: Portable chest x-ray of 12/14/2011  Findings: There has been an increase in opacities at the lung bases consistent with either atelectasis or pneumonia.  There is cardiomegaly present.  No pleural effusion is seen.  No bony abnormality is noted.  IMPRESSION: Increase in basilar opacities consistent with atelectasis or pneumonia.  Stable cardiomegaly.  Original Report Authenticated By: Juline Patch, M.D.   Dg Abd Portable 1v  12/21/2011  *RADIOLOGY REPORT*  Clinical Data: Abdominal distention, small bowel obstruction  PORTABLE ABDOMEN - 1 VIEW  Comparison: Abdomen films of 12/17/2011 and CT abdomen pelvis of 12/16/2011   Findings: Compared to the scout film from the CT as well as plain films from 01/14, there is somewhat less gaseous distention of the small bowel.  No colonic bowel gas is seen.  IMPRESSION: Improvement in gaseous distention of small bowel.  Original Report Authenticated By: Juline Patch, M.D.    Medications: Scheduled Meds:   . cloNIDine  0.2 mg Oral TID  . diltiazem  360 mg Oral Daily  . insulin aspart  2-10 Units Subcutaneous TID AC & HS  . insulin glargine  12 Units Subcutaneous QHS  . iohexol  20 mL Oral Q1 Hr x 2  . labetalol  100 mg Oral BID  . levofloxacin (LEVAQUIN) IV  750 mg Intravenous Q24H  . nicotine  21 mg Transdermal Daily  . potassium chloride  40 mEq Oral Daily  . vitamin B-12  1,000 mcg Oral Daily   Continuous Infusions:   . sodium chloride 125 mL/hr at 12/22/11 1247   PRN Meds:.dextrose, hydrALAZINE, morphine injection, ondansetron (ZOFRAN) IV   Assessment  61 y/o Male with PMHx of DM, HTN, CVA with residual weakness, abdominal aneurysm s/p repair was admitted on 1/8 with DKA. On 1/10 patient c/o abdominal pain with nausea/vomiting , KUB showed SBO. [place] sx was consulted and recommended conservative management. CT of the abdomen / pelvis demonstrated mid small bowel obstruction secondary to likely an adhesion in the upper central pelvis. At the same time, his BP became difficult to control as he was NPO and required IV Labetalol gtt with tx to ICU for management. Patient now improved and transferred to medial floor.   Plan:   AKI on chronic kidney disease  Further worsening of creatinine noted this am to 4.96. Has hardly had about 70 cc urine overnight. Failed attempted foley with bladder scan showing only 705 cc urine. Also has  several episodes of nausea and vomiting repeat chem showed further worsening of  creatinine to 6..07 Spoke with renal on call ( Dr Arlean Hopping) recommended ruling out obstructive uropathy given acute bump in creatinine.  CT abd and pelvis  ordered to r/o hydroureteronephrosis or BOO. Have spoke to urology Dr Laverle Patter as well and will follow up with him after CT acan Started IV NS   Hematuria Possibly related to traumatic catheterization. Had copious amount  Blood clot with foley removala nd also frsh bleeding after that which has now stopped. H&h wnl. Type and screen ordered  will transfer patient to stepdown  Malignant HTN  BP much improved now on current medications. 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 There is no signs oF bladder obstruction or hydroureteronephrosis. CT findings of  mild SBO seen on 1/13 is unchanged.  Discussed with Dr Laverle Patter , recommended placing a coude catheter. If unsuccessful he can be paged again patient subsequent creatine worsened to 6.07  paged renal again ( Dr Arlean Hopping) awaiting call back. Will sign off to nigh team.     LOS: 12 days   [name] 12/22/2011, 5:33 PM

## 2011-12-22 NOTE — Consult Note (Signed)
[name] 12/22/2011 [facility] Requesting Physician:  Dr. Gonzella Lex  Reason for Consult:  Acute on chronic renal failure HPI: The patient is a 61 y.o. year-old BM with 10-20 yr hx of HTN, AAA repair, CVA and DM type 2 admitted 12/10/2010 with uncontrolled DM and DKA.   In the hospital he developed a SBO.  Creatinine had been up on admission around 2.8, then gradually improved to 1.84.  Over the last 24 hrs, creat has risen rapidly up to 2.34 yesterday, 4.96 this am and 6.07 this evening.  Attempt to place foley unsuccessful, but bladder scan and CT abdomen (po contrast only) done today did not show any evidence of hydro or urinary retention.  UOP has been minimal.  Pt c/o nausea otherwise no complaints. Weight is down from 79.8 to 71.7 kg yesterday.  BP was very high initially, up to 210/120 at the highest, but has gradually come down to 135/80 range the last few days after addition of BP medication. He was taking an ACEI at home and that has not been changed.  He says for the last few months his BP has been really high, 200 over 90.  CT today showed calcified renal arteries and an atrophic right kidney.    Results of old creatinines are as follows:    Creatinine, Ser  Date/Time Value Range Status  12/22/2011  4:09 PM 6.07* 0.50-1.35 (mg/dL) Final  1/61/0960  4:54 AM 4.96* 0.50-1.35 (mg/dL) Final     DELTA CHECK NOTED  12/21/2011  5:40 AM 2.34* 0.50-1.35 (mg/dL) Final  0/98/1191  4:78 AM 1.84* 0.50-1.35 (mg/dL) Final  2/95/6213  0:86 AM 1.84* 0.50-1.35 (mg/dL) Final  5/78/4696  2:95 AM 1.73* 0.50-1.35 (mg/dL) Final  2/84/1324  4:01 AM 1.93* 0.50-1.35 (mg/dL) Final  0/27/2536  6:44 AM 1.87* 0.50-1.35 (mg/dL) Final  0/34/7425  9:56 AM 1.96* 0.50-1.35 (mg/dL) Final  3/87/5643  3:29 AM 1.83* 0.50-1.35 (mg/dL) Final  04/20/8415  6:06 AM 2.17* 0.50-1.35 (mg/dL) Final  3/0/1601  0:93 AM 2.50* 0.50-1.35 (mg/dL) Final  01/06/5572 22:02 PM 2.56* 0.50-1.35 (mg/dL) Final  04/05/2705  2:37 PM 2.69*  0.50-1.35 (mg/dL) Final  05/04/8314  1:76 PM 2.63* 0.50-1.35 (mg/dL) Final  12/09/735  1:06 PM 2.46* 0.50-1.35 (mg/dL) Final  01/09/9484 46:27 AM 2.33* 0.50-1.35 (mg/dL) Final  0/02/5008  3:81 AM 2.44* 0.50-1.35 (mg/dL) Final  07/04/9936  1:69 AM 2.54* 0.50-1.35 (mg/dL) Final  05/09/8937 10:17 PM 2.90* 0.50-1.35 (mg/dL) Final  04/02/257 52:77 PM 2.88* 0.50-1.35 (mg/dL) Final  07/26/2352  6:14 PM 1.26  0.40-1.50 (mg/dL) Final  03/06/1539  0:86 PM 1.31  0.40-1.50 (mg/dL) Final  06/07/1949  9:32 PM 1.43  0.40-1.50 (mg/dL) Final  6/71/2458  0:99 PM 1.59* 0.40-1.50 (mg/dL) Final  8/33/8250  5:39 AM 1.42  0.4-1.5 (mg/dL) Final  7/67/3419  3:79 PM 1.63* 0.40-1.50 (mg/dL) Final  0/24/0973 53:29 AM 1.37  0.40-1.50 (mg/dL) Final  08/26/2682  4:19 AM 1.52* 0.4-1.5 (mg/dL) Final  05/24/2978  8:92 AM 1.53* 0.4-1.5 (mg/dL) Final  12/21/4172  0:81 AM 1.96* 0.4-1.5 (mg/dL) Final  4/48/1856  3:14 AM 2.56* 0.4-1.5 (mg/dL) Final  9/70/2637  8:58 AM 2.47* 0.4-1.5 (mg/dL) Final  8/50/2774 12:87 PM 2.70* 0.4-1.5 (mg/dL) Final  8/67/6720  9:47 PM 3.15* 0.4-1.5 (mg/dL) Final  08/09/2835  6:29 PM 1.47  0.40-1.50 (mg/dL) Final  03/09/6545  5:03 PM 1.67* 0.40-1.50 (mg/dL) Final  04/06/6567  1:27 PM 1.65* 0.40-1.50 (mg/dL) Final  04/18/16  4:94 AM 1.25   Final  02/22/2008  8:30 AM 1.67*  Final  02/13/2008  5:28 AM 1.86*  Final  02/12/2008  5:15 AM 2.29*  Final  02/11/2008  6:40 AM 2.24*  Final  02/10/2008  1:19 PM 2.44*  Final  02/08/2008  3:35 PM 2.21*  Final  02/02/2008  4:00 AM 1.29   Final  01/31/2008  9:07 PM 1.8*  Final    Past Medical History:  Past Medical History  Diagnosis Date  . Diabetes mellitus   . Hypertension   . Aneurysm of aorta   . Aneurysm of abdominal aorta   . Stroke     CVA effected  eye  & leg  . Cataract, bilateral   . Renal disorder     Past Surgical History:  Past Surgical History  Procedure Date  . Abdominal aortic aneurysm repair   . Hernia repair     Family History:  Family History    Problem Relation Age of Onset  . Cancer Mother   . Diabetes Mother   . Heart failure Father   . Stroke Father    Social History:  reports that he has been smoking.  He does not have any smokeless tobacco history on file. He reports that he drinks alcohol. He reports that he does not use illicit drugs.  Allergies: No Known Allergies  Home medications: Prior to Admission medications   Medication Sig Start Date End Date Taking? Authorizing Provider  allopurinol (ZYLOPRIM) 100 MG tablet Take 100 mg by mouth daily.     Yes Historical Provider, MD  aspirin EC 81 MG tablet Take 81 mg by mouth every morning.     Yes Historical Provider, MD  cloNIDine (CATAPRES) 0.2 MG tablet Take 0.2 mg by mouth 3 (three) times daily.    Yes Historical Provider, MD  diltiazem (TIAZAC) 360 MG 24 hr capsule Take 360 mg by mouth daily.     Yes Historical Provider, MD  glipiZIDE (GLUCOTROL) 10 MG tablet Take 10 mg by mouth 2 (two) times daily before a meal.     Yes Historical Provider, MD  labetalol (NORMODYNE) 100 MG tablet Take 100 mg by mouth 2 (two) times daily.     Yes Historical Provider, MD  lisinopril (PRINIVIL,ZESTRIL) 20 MG tablet Take 20 mg by mouth 2 (two) times daily.    Yes Historical Provider, MD  salsalate (DISALCID) 500 MG tablet Take 500 mg by mouth 3 (three) times daily.    Yes Historical Provider, MD  simvastatin (ZOCOR) 20 MG tablet Take 20 mg by mouth at bedtime.     Yes Historical Provider, MD  vitamin B-12 (CYANOCOBALAMIN) 1000 MCG tablet Take 1,000 mcg by mouth daily.     Yes Historical Provider, MD    Inpatient medications:    . cloNIDine  0.2 mg Oral TID  . diltiazem  360 mg Oral Daily  . insulin aspart  2-10 Units Subcutaneous TID AC & HS  . insulin glargine  12 Units Subcutaneous QHS  . iohexol  20 mL Oral Q1 Hr x 2  . labetalol  100 mg Oral BID  . levofloxacin (LEVAQUIN) IV  750 mg Intravenous Q24H  . nicotine  21 mg Transdermal Daily  . vitamin B-12  1,000 mcg Oral Daily  .  DISCONTD: potassium chloride  40 mEq Oral Daily    Review of Systems Gen:  Denies headache, fever, chills, sweats.  No weight loss. HEENT:  No visual change, sore throat, difficulty swallowing. Resp:  No difficulty breathing, DOE.  No cough or hemoptysis. Cardiac:  No chest pain,  orthopnea, PND.  Denies edema. GI:   Denies abdominal pain.  No constipation. GU:  Passing blood after foley trauma today.  Voided small amt tonight.     MS:  Denies joint pain or swelling.   Derm:  Denies skin rash or itching.  No chronic skin conditions.  Neuro:   Denies focal weakness, memory problems. Psych:  Denies symptoms of depression of anxiety.  No hallucination.    Physical Exam:  Blood pressure 93/70, pulse 60, temperature 97.5 F (36.4 C), temperature source Oral, resp. rate 24, height 6\' 3"  (1.905 m), weight 71.7 kg (158 lb 1.1 oz), SpO2 98.00%.  Gen: alert, slightly uncomfortble Skin: no rash, cyanosis Neck: no JVD, bruits or LAN Chest: clear bilat, somewhat dec'd BS at the bases Heart: regular, no rub or gallop Abdomen: soft, nontender, large scar from AAA repair, nondistended, no bruits , no masses Ext: no edema LE's, no ischemic changes or gangrene Neuro:  No asterixis, nonfocal motor exam, ox 3 Neuro: alert, Ox3, no focal deficit Heme/Lymph: no bruising or LAN  Labs: Basic Metabolic Panel:  Lab 12/22/11 8119 12/22/11 0630 12/21/11 0540 12/20/11 0630 12/19/11 0530 12/18/11 0430 12/17/11 0500  NA 132* 135 139 139 137 139 140  K 5.1 4.5 3.9 3.4* 3.6 3.3* 3.6  CL 92* 91* 102 107 106 105 107  CO2 20 19 22 20 22  16* 19  GLUCOSE 220* 245* 175* 128* 142* 131* 115*  BUN 58* 47* 21 14 16 17 18   CREATININE 6.07* 4.96* 2.34* 1.84* 1.84* 1.73* 1.93*  ALB -- -- -- -- -- -- --  CALCIUM 9.1 9.9 9.4 8.5 8.8 8.5 9.1  PHOS -- -- -- -- -- -- --   Liver Function Tests: No results found for this basename: AST:3,ALT:3,ALKPHOS:3,BILITOT:3,PROT:3,ALBUMIN:3 in the last 168 hours No results found for  this basename: LIPASE:3,AMYLASE:3 in the last 168 hours No results found for this basename: AMMONIA:3 in the last 168 hours CBC:  Lab 12/22/11 1609 12/19/11 0530 12/18/11 0430 12/17/11 0500  WBC 13.1* 10.2 10.4 11.3*  NEUTROABS -- -- -- --  HGB 13.5 11.2* 12.0* 12.3*  HCT 39.1 32.3* 34.4* 36.3*  MCV 85.7 84.1 85.6 86.6  PLT 287 180 181 177   PT/INR: @labrcntip (inr:5) Cardiac Enzymes: No results found for this basename: CKTOTAL:5,CKMB:5,CKMBINDEX:5,TROPONINI:5 in the last 168 hours CBG:  Lab 12/22/11 1659 12/22/11 1131 12/22/11 0745 12/21/11 2114 12/21/11 1641  GLUCAP 205* 274* 271* 202* 262*    Iron Studies: No results found for this basename: IRON:30,TIBC:30,TRANSFERRIN:30,FERRITIN:30 in the last 168 hours  Xrays/Other Studies: Ct Abdomen Pelvis Wo Contrast  12/22/2011  *RADIOLOGY REPORT*  Clinical Data: Evaluate for small bowel obstruction.  Vomiting. Poor urine output and worsening renal function.  Exclude urinary tract obstruction.  CT ABDOMEN AND PELVIS WITHOUT CONTRAST  Technique:  Multidetector CT imaging of the abdomen and pelvis was performed following the standard protocol without intravenous contrast.  Comparison: CT abdomen pelvis without contrast 12/16/2011.  Findings: There is an airspace opacity with air bronchograms in the anterior right lung base, which were not present on the CT of 12/16/2011.  There is bronchiectasis in both lower lobes.  Patchy opacities in the dependent portions of both lower lobes may reflect atelectasis and/or consolidation.  There are blebs in the lingula and medial segment the right middle lobe.  Cardiomegaly is stable. There is a stable small pericardial effusion.  No definite pleural effusion on today's study.  There is marked right renal atrophy.  This is stable compared  to CT of 12/16/2011.  There is no hydronephrosis of the atrophic right kidney.  No evidence of mass or stone.  The left kidney is normal in size.  There is no hydronephrosis,  stone, or evidence of mass on the left.  There are scattered calcifications in the renal arteries bilaterally. The ureters are normal in caliber.  Aneurysmal dilatation of the proximal abdominal aorta is stable and measures 4.9 cm AP diameter by 4.7 cm transverse diameter.  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  A single partially calcified gallstone is seen in the gallbladder fundus.  Gallbladder wall appears normal by CT.  The noncontrast appearance of the liver, spleen, adrenal glands, and pancreas is within normal limits.  Stomach is moderately distended.  The duodenum jejunal and proximal ileal loops are dilated, and several contain air contrast levels. Jejunum measures up to 4.2 cm in caliber.  Overall, there is no significant change in bowel dilatation compared to prior study 12/16/2011.  Distal ileal loops are decompressed.  The transition from dilated to decompressed small bowel loops is again noted in the upper pelvis, near the level of the aortoiliac bifurcation.  There is no evidence of bowel wall thickening, pneumatosis, or free intraperitoneal air.  There is a prominent amount of stool in the colon.  There are scattered colonic diverticula from the cecum to the distal sigmoid colon.  Urinary bladder is not very distended.  Prostate gland within normal limits.  The right inguinal hernia is present and contains fat only.  No acute or suspicious bony abnormality.  IMPRESSION:  1.  Persistent small bowel obstruction pattern.  Overall, no significant change in bowel dilatation is appreciated compared to the CT of 12/16/2011.  Transition from dilated small bowel is not seen in the upper anatomic pelvis.  Question adhesions in this patient status post repair of distal abdominal aortic aneurysm with aorto by iliac graft. 2.  Stable proximal abdominal aortic aneurysm. 3.  Significant right renal atrophy.  Negative for  urinary tract obstruction. 4.  Interval development of anterior right basilar airspace disease suspicious for pneumonia or aspiration.  Opacities in both lower lobes could reflect atelectasis and/or airspace disease. 5.  Colonic diverticulosis, uncomplicated. 6.  Right inguinal hernia contains fat. 7.  Cholelithiasis.  Original Report Authenticated By: Britta Mccreedy, M.D.   Dg Chest Port 1 View  12/21/2011  *RADIOLOGY REPORT*  Clinical Data: Shortness of breath, fever  PORTABLE CHEST - 1 VIEW  Comparison: Portable chest x-ray of 12/14/2011  Findings: There has been an increase in opacities at the lung bases consistent with either atelectasis or pneumonia.  There is cardiomegaly present.  No pleural effusion is seen.  No bony abnormality is noted.  IMPRESSION: Increase in basilar opacities consistent with atelectasis or pneumonia.  Stable cardiomegaly.  Original Report Authenticated By: Juline Patch, M.D.   Dg Abd Portable 1v  12/21/2011  *RADIOLOGY REPORT*  Clinical Data: Abdominal distention, small bowel obstruction  PORTABLE ABDOMEN - 1 VIEW  Comparison: Abdomen films of 12/17/2011 and CT abdomen pelvis of 12/16/2011  Findings: Compared to the scout film from the CT as well as plain films from 01/14, there is somewhat less gaseous distention of the small bowel.  No colonic bowel gas is seen.  IMPRESSION: Improvement in gaseous distention of small bowel.  Original Report Authenticated By: Juline Patch, M.D.    Assessment: 1.  Acute on CKD, uncertain etiology.  Rapid rise in creat very concerning.  Bladder outlet obstruction must be ruled out, but imaging studies do not support.  Pt has a nonfunctional right kidney, hx PVD with AAA.  The atrophic kidney is mostly likely due to renovascular disease.  He has longstanding HTN and the degree of BP control that has been attained the last couple of days in the hospital may be causing hypoperfusion of the kidneys in the presence of fixed smallvessel disease and  impaired autoregulation in the renal vasculature.  Should check CPK to rule out rhabdo also, but no good explanation to have this.  Lastly, large vessel acute occlusion of the L renal artery (dissection, embolus, severe stenosis) is another possibility for rapid loss of renal function in patient with a solitary functioning kidney and known severe vascular disease.   2.  CKD, baseline creat 1.3-1.7 3.  HTN 4.  PSBO 5.  Hx AAA repair 6.  HL 7.  DM 2  Recommendations:   For now would recommend fluid bolus and holding BP meds and let BP rise up to 180/90 range if possible.  Recommend urology assistance for bladder catheterization.  Would ask [place] in am if they could do a renal artery doppler just for existence of blood flow to the kidney.  Will follow closely. Told pt he will likely require dialysis in next 48 hrs barring some dramatic turnaround.    Vinson Moselle, MD [facility] [phone] pager   [phone] cell 12/22/2011, 10:30 PM

## 2011-12-22 NOTE — Progress Notes (Signed)
Writing RN attempted to insert 15fr foley catheter at 1320. No resistance met when inserting foley, but no urine return present and patient began to wince and verbalized discomfort when attempting to inflate bulb. Foley catheter removed at that time and charge nurse notified. Charge nurse performed bladder scan, which showed less than 75ml of urine present. He states he attempted to insert foley and patient verbalized discomfort when he attempted to inflate bulb. Upon removing foley, he stated there was a large clot of dark/old blood. Patient called to go to bathroom and was being assisted by cna to bathroom and large amount of bleeding from penis occurred when he stood up. Md notified of new onset of bleeding and came to evaluate patient. New orders received.

## 2011-12-23 ENCOUNTER — Inpatient Hospital Stay (HOSPITAL_COMMUNITY): Payer: Medicare Other

## 2011-12-23 LAB — RENAL FUNCTION PANEL
Albumin: 2.4 g/dL — ABNORMAL LOW (ref 3.5–5.2)
BUN: 69 mg/dL — ABNORMAL HIGH (ref 6–23)
GFR calc Af Amer: 9 mL/min — ABNORMAL LOW (ref >90)
Phosphorus: 5.7 mg/dL — ABNORMAL HIGH (ref 2.3–4.6)
Potassium: 5.1 mEq/L (ref 3.5–5.1)
Sodium: 133 mEq/L — ABNORMAL LOW (ref 135–145)

## 2011-12-23 LAB — URINALYSIS, ROUTINE W REFLEX MICROSCOPIC
Bilirubin Urine: NEGATIVE
Ketones, ur: NEGATIVE mg/dL
Specific Gravity, Urine: 1.015 (ref 1.005–1.030)
Urobilinogen, UA: 0.2 mg/dL (ref 0.0–1.0)
pH: 5 (ref 5.0–8.0)

## 2011-12-23 LAB — GLUCOSE, CAPILLARY
Glucose-Capillary: 155 mg/dL — ABNORMAL HIGH (ref 70–99)
Glucose-Capillary: 152 mg/dL — ABNORMAL HIGH (ref 70–99)
Glucose-Capillary: 100 mg/dL — ABNORMAL HIGH (ref 70–99)

## 2011-12-23 LAB — CK: Total CK: 58 U/L (ref 7–232)

## 2011-12-23 MED ORDER — CLONIDINE HCL 0.1 MG PO TABS
0.1000 mg | ORAL_TABLET | Freq: Three times a day (TID) | ORAL | Status: DC
  Filled 2011-12-23 (×3): qty 1

## 2011-12-23 MED ORDER — ACETAMINOPHEN 325 MG PO TABS
650.0000 mg | ORAL_TABLET | Freq: Four times a day (QID) | ORAL | Status: DC | PRN
  Administered 2011-12-23: 650 mg via ORAL
  Filled 2011-12-23: qty 2

## 2011-12-23 MED ORDER — SODIUM CHLORIDE 0.9 % IV BOLUS (SEPSIS)
500.0000 mL | Freq: Once | INTRAVENOUS | Status: AC
  Administered 2011-12-23: 500 mL via INTRAVENOUS

## 2011-12-23 MED ORDER — BISACODYL 10 MG RE SUPP
10.0000 mg | Freq: Two times a day (BID) | RECTAL | Status: AC
  Administered 2011-12-23 – 2011-12-24 (×3): 10 mg via RECTAL
  Filled 2011-12-23 (×3): qty 1

## 2011-12-23 MED ORDER — NYSTATIN 100000 UNIT/ML MT SUSP
10.0000 mL | Freq: Four times a day (QID) | OROMUCOSAL | Status: DC
  Administered 2011-12-23 – 2011-12-28 (×21): 1000000 [IU] via ORAL
  Filled 2011-12-23 (×27): qty 10

## 2011-12-23 MED ORDER — CLONIDINE HCL 0.1 MG PO TABS
0.1000 mg | ORAL_TABLET | Freq: Two times a day (BID) | ORAL | Status: DC | PRN
  Administered 2011-12-24: 0.1 mg via ORAL
  Filled 2011-12-23 (×2): qty 1

## 2011-12-23 MED ORDER — DILTIAZEM HCL ER BEADS 120 MG PO CP24: 120.0000 mg | ORAL_CAPSULE | Freq: Every day | ORAL | Status: DC

## 2011-12-23 MED ORDER — HYDROCODONE-ACETAMINOPHEN 5-325 MG PO TABS
1.0000 | ORAL_TABLET | ORAL | Status: DC | PRN
  Administered 2011-12-26: 2 via ORAL
  Administered 2011-12-27: 1 via ORAL
  Filled 2011-12-23: qty 2
  Filled 2011-12-23: qty 1

## 2011-12-23 NOTE — Consult Note (Signed)
Urology Consult   Physician requesting consult: Dr. Alphonsa Gin  Reason for consult: Difficult catheterization  History of Present Illness: [name] is a 61 y.o. admitted to the hospital with a small bowel obstruction.  He has developed progressive acute renal failure. His bladder PVR has been measured at 70 cc and a CT scan has demonstrated a chronically atropic right kidney but no hydronephrosis and no evidence of a distended bladder. Attempts to place a catheter by the nursing staff have been unsuccessful. A catheter is requested for UOP monitoring due to his renal failure.  He denies a history of voiding or storage urinary symptoms, hematuria, UTIs, STDs, urolithiasis, GU malignancy/trauma/surgery.  Past Medical History  Diagnosis Date  . Diabetes mellitus   . Hypertension   . Aneurysm of aorta   . Aneurysm of abdominal aorta   . Stroke     CVA effected  eye  & leg  . Cataract, bilateral   . Renal disorder     Past Surgical History  Procedure Date  . Abdominal aortic aneurysm repair   . Hernia repair     Home Medications:  Medications Prior to Admission  Medication Dose Route Frequency Provider Last Rate Last Dose  . 0.9 %  sodium chloride infusion   Intravenous Continuous Eduard Clos, MD      . 0.9 %  sodium chloride infusion   Intravenous Continuous Kela Millin, MD 125 mL/hr at 12/16/11 0940    . 0.9 %  sodium chloride infusion   Intravenous Continuous Nishant Dhungel, MD 125 mL/hr at 12/23/11 0503 10,000 mL at 12/23/11 0503  . bd getting started take home kit 3/10 ml X 30g syringes 1 kit  1 kit Other Once Ramiro Harvest, MD   1 kit at 12/12/11 [phone]  . bisacodyl (DULCOLAX) suppository 10 mg  10 mg Rectal BID Calvert Cantor, MD      . cloNIDine (CATAPRES) tablet 0.1 mg  0.1 mg Oral Once Rolan Lipa   0.1 mg at 12/16/11 0217  . cloNIDine (CATAPRES) tablet 0.1 mg  0.1 mg Oral TID Calvert Cantor, MD      . dextrose 50 % solution 25 mL  25 mL  Intravenous PRN Eduard Clos, MD      . insulin aspart (novoLOG) injection 2-10 Units  2-10 Units Subcutaneous TID AC & HS Maren Reamer, NP   2 Units at 12/23/11 9811  . insulin aspart (novoLOG) injection 3 Units  3 Units Subcutaneous Once Kela Millin, MD   3 Units at 12/12/11 1211  . insulin glargine (LANTUS) injection 10 Units  10 Units Subcutaneous Once Ramiro Harvest, MD   10 Units at 12/11/11 1945  . insulin glargine (LANTUS) injection 12 Units  12 Units Subcutaneous QHS Lonia Blood, MD   12 Units at 12/22/11 2314  . insulin regular (NOVOLIN R,HUMULIN R) 1 Units/mL in sodium chloride 0.9 % 100 mL infusion   Intravenous To Major Vida Roller, MD 4.2 mL/hr at 12/11/11 0408 4.2 Units/hr at 12/11/11 0408  . iohexol (OMNIPAQUE) 300 MG/ML solution 20 mL  20 mL Oral Q1 Hr x 2 Medication Radiologist, MD   20 mL at 12/22/11 1630  . Levofloxacin (LEVAQUIN) IVPB 750 mg  750 mg Intravenous Q24H Nishant Dhungel, MD   750 mg at 12/22/11 1806  . living well with diabetes book MISC   Does not apply Once Ramiro Harvest, MD      . morphine 2 MG/ML injection 1 mg  1 mg Intravenous Q4H PRN Kela Millin, MD   1 mg at 12/15/11 1727  . nicotine (NICODERM CQ - dosed in mg/24 hours) patch 21 mg  21 mg Transdermal Daily Ramiro Harvest, MD   21 mg at 12/23/11 0929  . ondansetron (ZOFRAN) injection 4 mg  4 mg Intravenous Q6H PRN Kela Millin, MD   4 mg at 12/22/11 0846  . potassium chloride 10 mEq in 100 mL IVPB  10 mEq Intravenous Q1 Hr x 4 Amanjot Sidhu, MD   10 mEq at 12/18/11 1230  . sodium chloride 0.9 % bolus 1,000 mL  1,000 mL Intravenous Once Vida Roller, MD   1,000 mL at 12/11/11 0004  . sodium chloride 0.9 % bolus 1,000 mL  1,000 mL Intravenous Once Vida Roller, MD   1,000 mL at 12/11/11 0059  . vitamin B-12 (CYANOCOBALAMIN) tablet 1,000 mcg  1,000 mcg Oral Daily Lonia Blood, MD   1,000 mcg at 12/23/11 0929  . DISCONTD: 0.9 %  sodium chloride infusion   Intravenous  Continuous Vida Roller, MD 150 mL/hr at 12/11/11 0200 150 mL/hr at 12/11/11 0200  . DISCONTD: 0.9 %  sodium chloride infusion   Intravenous Continuous Eduard Clos, MD      . DISCONTD: 0.9 %  sodium chloride infusion   Intravenous Continuous Kela Millin, MD 100 mL/hr at 12/18/11 2149    . DISCONTD: bd getting started take home kit 1/2 ml x 30g syringes 1 kit  1 kit Other Once Ramiro Harvest, MD      . DISCONTD: cloNIDine (CATAPRES - Dosed in mg/24 hr) patch 0.2 mg  0.2 mg Transdermal Weekly Leslye Peer, MD   0.2 mg at 12/18/11 1126  . DISCONTD: cloNIDine (CATAPRES - Dosed in mg/24 hr) patch 0.3 mg  0.3 mg Transdermal Weekly Kela Millin, MD   0.3 mg at 12/13/11 1801  . DISCONTD: cloNIDine (CATAPRES) tablet 0.2 mg  0.2 mg Oral TID Eduard Clos, MD   0.2 mg at 12/14/11 1013  . DISCONTD: cloNIDine (CATAPRES) tablet 0.2 mg  0.2 mg Oral TID Maree Krabbe, MD   0.2 mg at 12/22/11 1732  . DISCONTD: dextrose 5 %-0.45 % sodium chloride infusion   Intravenous Continuous Vida Roller, MD      . DISCONTD: dextrose 5 %-0.45 % sodium chloride infusion   Intravenous Continuous Eduard Clos, MD 125 mL/hr at 12/11/11 1912    . DISCONTD: dextrose 50 % solution 25 mL  25 mL Intravenous PRN Vida Roller, MD      . DISCONTD: diltiazem [facility]) 24 hr capsule 120 mg  120 mg Oral Daily Calvert Cantor, MD      . DISCONTD: diltiazem (TIAZAC) 24 hr capsule 360 mg  360 mg Oral Daily Eduard Clos, MD   360 mg at 12/12/11 0943  . DISCONTD: diltiazem (TIAZAC) 24 hr capsule 360 mg  360 mg Oral Daily Ramiro Harvest, MD   360 mg at 12/14/11 1059  . DISCONTD: diltiazem (TIAZAC) 24 hr capsule 360 mg  360 mg Oral Daily Maree Krabbe, MD      . DISCONTD: diltiazem (TIAZAC) 360 mg  360 mg Oral Daily Lonia Blood, MD   360 mg at 12/22/11 1000  . DISCONTD: hydrALAZINE (APRESOLINE) injection 10 mg  10 mg Intravenous Q6H PRN Rolan Lipa   10 mg at 12/13/11 2149  .  DISCONTD: hydrALAZINE (APRESOLINE) injection 10 mg  10  mg Intravenous Q6H PRN Nishant Dhungel, MD      . DISCONTD: hydrALAZINE (APRESOLINE) injection 20 mg  20 mg Intravenous Q6H PRN Rolan Lipa   20 mg at 12/15/11 2343  . DISCONTD: hydrALAZINE (APRESOLINE) injection 20 mg  20 mg Intravenous Q4H PRN Kela Millin, MD   20 mg at 12/16/11 1404  . DISCONTD: insulin aspart (novoLOG) injection 0-15 Units  0-15 Units Subcutaneous TID WC Ramiro Harvest, MD   11 Units at 12/12/11 [phone]  . DISCONTD: insulin aspart (novoLOG) injection 0-15 Units  0-15 Units Subcutaneous Q4H Rolan Lipa   3 Units at 12/20/11 1746  . DISCONTD: insulin aspart (novoLOG) injection 0-20 Units  0-20 Units Subcutaneous TID WC Kela Millin, MD   4 Units at 12/13/11 1744  . DISCONTD: insulin aspart (novoLOG) injection 0-5 Units  0-5 Units Subcutaneous QHS Kela Millin, MD   2 Units at 12/12/11 2201  . DISCONTD: insulin glargine (LANTUS) injection 10 Units  10 Units Subcutaneous Daily Ramiro Harvest, MD   10 Units at 12/11/11 1939  . DISCONTD: insulin glargine (LANTUS) injection 10 Units  10 Units Subcutaneous QHS Ramiro Harvest, MD      . DISCONTD: insulin glargine (LANTUS) injection 18 Units  18 Units Subcutaneous QHS Kela Millin, MD   18 Units at 12/12/11 2156  . DISCONTD: insulin glargine (LANTUS) injection 8 Units  8 Units Subcutaneous QHS Kela Millin, MD   8 Units at 12/18/11 2134  . DISCONTD: insulin regular (NOVOLIN R,HUMULIN R) 1 Units/mL in sodium chloride 0.9 % 100 mL infusion   Intravenous Continuous Eduard Clos, MD   3.7 Units/hr at 12/11/11 2120  . DISCONTD: insulin regular bolus via infusion 0-10 Units  0-10 Units Intravenous TID WC Vida Roller, MD      . DISCONTD: labetalol (NORMODYNE) tablet 100 mg  100 mg Oral BID Eduard Clos, MD   100 mg at 12/14/11 1012  . DISCONTD: labetalol (NORMODYNE) tablet 100 mg  100 mg Oral BID Lonia Blood, MD   100 mg at  12/22/11 1247  . DISCONTD: labetalol (NORMODYNE) tablet 100 mg  100 mg Oral BID Maree Krabbe, MD      . DISCONTD: labetalol (NORMODYNE,TRANDATE) 4 mg/mL in dextrose 5 % 125 mL infusion  0.5-3 mg/min Intravenous Titrated Kela Millin, MD 30 mL/hr at 12/17/11 1315 2 mg/min at 12/17/11 1315  . DISCONTD: labetalol (NORMODYNE,TRANDATE) injection 10 mg  10 mg Intravenous Q2H PRN Eduard Clos, MD   10 mg at 12/13/11 2026  . DISCONTD: labetalol (NORMODYNE,TRANDATE) injection 10 mg  10 mg Intravenous Q4H PRN Kela Millin, MD   10 mg at 12/14/11 0445  . DISCONTD: labetalol (NORMODYNE,TRANDATE) injection 10 mg  10 mg Intravenous Q4H PRN Kela Millin, MD   10 mg at 12/17/11 1108  . DISCONTD: metoprolol (LOPRESSOR) injection 2.5-5 mg  2.5-5 mg Intravenous Q6H Canary Brim, NP   5 mg at 12/17/11 1732  . DISCONTD: metoprolol (LOPRESSOR) injection 5 mg  5 mg Intravenous Q8H Adeline C Viyuoh, MD   5 mg at 12/15/11 1459  . DISCONTD: metoprolol (LOPRESSOR) injection 5 mg  5 mg Intravenous Q6H Adeline C Viyuoh, MD   5 mg at 12/16/11 1219  . DISCONTD: metoprolol (LOPRESSOR) injection 5 mg  5 mg Intravenous Q4H Adeline C Viyuoh, MD   5 mg at 12/17/11 0920  . DISCONTD: niCARdipine (CARDENE-IV) infusion (0.1 mg/ml)  5 mg/hr Intravenous Continuous Canary Brim,  NP 50 mL/hr at 12/18/11 1159 5 mg/hr at 12/18/11 1159  . DISCONTD: ondansetron (ZOFRAN) injection 4 mg  4 mg Intravenous Q8H PRN Vida Roller, MD      . DISCONTD: pantoprazole (PROTONIX) injection 40 mg  40 mg Intravenous Q24H Adeline C Viyuoh, MD   40 mg at 12/19/11 1019  . DISCONTD: potassium chloride 10 mEq in 100 mL IVPB  10 mEq Intravenous Q1H Eduard Clos, MD      . DISCONTD: potassium chloride SA (K-DUR,KLOR-CON) CR tablet 20 mEq  20 mEq Oral Daily Lonia Blood, MD      . DISCONTD: potassium chloride SA (K-DUR,KLOR-CON) CR tablet 40 mEq  40 mEq Oral Daily Nishant Dhungel, MD   40 mEq at 12/22/11 1246  . DISCONTD:  rosuvastatin (CRESTOR) tablet 5 mg  5 mg Oral q1800 Riki Rusk, PHARMD   5 mg at 12/15/11 1753  . DISCONTD: simvastatin (ZOCOR) tablet 20 mg  20 mg Oral QHS Eduard Clos, MD   20 mg at 12/11/11 2131   Medications Prior to Admission  Medication Sig Dispense Refill  . allopurinol (ZYLOPRIM) 100 MG tablet Take 100 mg by mouth daily.        . cloNIDine (CATAPRES) 0.2 MG tablet Take 0.2 mg by mouth 3 (three) times daily.       Marland Kitchen glipiZIDE (GLUCOTROL) 10 MG tablet Take 10 mg by mouth 2 (two) times daily before a meal.        . lisinopril (PRINIVIL,ZESTRIL) 20 MG tablet Take 20 mg by mouth 2 (two) times daily.       . salsalate (DISALCID) 500 MG tablet Take 500 mg by mouth 3 (three) times daily.       . simvastatin (ZOCOR) 20 MG tablet Take 20 mg by mouth at bedtime.          Current Hospital Medications: Scheduled Meds:   . bisacodyl  10 mg Rectal BID  . cloNIDine  0.1 mg Oral TID  . insulin aspart  2-10 Units Subcutaneous TID AC & HS  . insulin glargine  12 Units Subcutaneous QHS  . iohexol  20 mL Oral Q1 Hr x 2  . levofloxacin (LEVAQUIN) IV  750 mg Intravenous Q24H  . nicotine  21 mg Transdermal Daily  . vitamin B-12  1,000 mcg Oral Daily  . DISCONTD: cloNIDine  0.2 mg Oral TID  . DISCONTD: diltiazem  120 mg Oral Daily  . DISCONTD: diltiazem  360 mg Oral Daily  . DISCONTD: diltiazem  360 mg Oral Daily  . DISCONTD: labetalol  100 mg Oral BID  . DISCONTD: labetalol  100 mg Oral BID  . DISCONTD: potassium chloride  40 mEq Oral Daily   Continuous Infusions:   . sodium chloride 10,000 mL (12/23/11 0503)   PRN Meds:.dextrose, morphine injection, ondansetron (ZOFRAN) IV, DISCONTD: hydrALAZINE  Allergies: No Known Allergies  Family History  Problem Relation Age of Onset  . Cancer Mother   . Diabetes Mother   . Heart failure Father   . Stroke Father     Social History:  reports that he has been smoking.  He does not have any smokeless tobacco history on file. He reports  that he drinks alcohol. He reports that he does not use illicit drugs.  ROS: A complete review of systems was performed.  All systems are negative except for pertinent findings as noted.  Physical Exam:  Vital signs in last 24 hours: Temp:  [97.5 F (36.4 C)-98.7  F (37.1 C)] 98 F (36.7 C) (01/20 0735) Pulse Rate:  [51-81] 53  (01/20 0930) Resp:  [14-26] 19  (01/20 0930) BP: (93-133)/(50-93) 96/50 mmHg (01/20 0930) SpO2:  [92 %-98 %] 96 % (01/20 0930) Weight:  [72.4 kg (159 lb 9.8 oz)] 72.4 kg (159 lb 9.8 oz) (01/19 2034) General:  Alert and oriented, No acute distress HEENT: Normocephalic, atraumatic Neck: No JVD or lymphadenopathy Cardiovascular: Regular rate and rhythm Lungs: Normal respiratory effort Abdomen: Soft, nontender, nondistended, no abdominal masses. Well healed midline scar. Back: No CVA tenderness Extremities: No edema Neurologic: Grossly intact  Laboratory Data:   [facility] 12/22/11 1609  WBC 13.1*  HGB 13.5  HCT 39.1  PLT 287     Basename 12/23/11 0452 12/22/11 1609 12/22/11 0630 12/21/11 0540  NA 133* 132* 135 139  K 5.1 5.1 4.5 3.9  CL 95* 92* 91* 102  GLUCOSE 148* 220* 245* 175*  BUN 69* 58* 47* 21  CALCIUM 7.9* 9.1 9.9 9.4  CREATININE 6.56* 6.07* 4.96* 2.34*     Results for orders placed during the hospital encounter of 12/10/11 (from the past 24 hour(s))  GLUCOSE, CAPILLARY     Status: Abnormal   Collection Time   12/22/11 11:31 AM      Component Value Range   Glucose-Capillary 274 (*) 70 - 99 (mg/dL)  LACTIC ACID, PLASMA     Status: Normal   Collection Time   12/22/11 12:05 PM      Component Value Range   Lactic Acid, Venous 1.3  0.5 - 2.2 (mmol/L)  BASIC METABOLIC PANEL     Status: Abnormal   Collection Time   12/22/11  4:09 PM      Component Value Range   Sodium 132 (*) 135 - 145 (mEq/L)   Potassium 5.1  3.5 - 5.1 (mEq/L)   Chloride 92 (*) 96 - 112 (mEq/L)   CO2 20  19 - 32 (mEq/L)   Glucose, Bld 220 (*) 70 - 99 (mg/dL)   BUN  58 (*) 6 - 23 (mg/dL)   Creatinine, Ser 1.61 (*) 0.50 - 1.35 (mg/dL)   Calcium 9.1  8.4 - 09.6 (mg/dL)   GFR calc non Af Amer 9 (*) >90 (mL/min)   GFR calc Af Amer 10 (*) >90 (mL/min)  CBC     Status: Abnormal   Collection Time   12/22/11  4:09 PM      Component Value Range   WBC 13.1 (*) 4.0 - 10.5 (K/uL)   RBC 4.56  4.22 - 5.81 (MIL/uL)   Hemoglobin 13.5  13.0 - 17.0 (g/dL)   HCT 04.5  40.9 - 81.1 (%)   MCV 85.7  78.0 - 100.0 (fL)   MCH 29.6  26.0 - 34.0 (pg)   MCHC 34.5  30.0 - 36.0 (g/dL)   RDW 91.4  78.2 - 95.6 (%)   Platelets 287  150 - 400 (K/uL)  TYPE AND SCREEN     Status: Normal   Collection Time   12/22/11  4:24 PM      Component Value Range   ABO/RH(D) O POS     Antibody Screen NEG     Sample Expiration 12/25/2011    GLUCOSE, CAPILLARY     Status: Abnormal   Collection Time   12/22/11  4:59 PM      Component Value Range   Glucose-Capillary 205 (*) 70 - 99 (mg/dL)  GLUCOSE, CAPILLARY     Status: Abnormal   Collection Time   12/22/11  9:42 PM      Component Value Range   Glucose-Capillary 150 (*) 70 - 99 (mg/dL)   Comment 1 Documented in Chart     Comment 2 Notify RN    RENAL FUNCTION PANEL     Status: Abnormal   Collection Time   12/23/11  4:52 AM      Component Value Range   Sodium 133 (*) 135 - 145 (mEq/L)   Potassium 5.1  3.5 - 5.1 (mEq/L)   Chloride 95 (*) 96 - 112 (mEq/L)   CO2 20  19 - 32 (mEq/L)   Glucose, Bld 148 (*) 70 - 99 (mg/dL)   BUN 69 (*) 6 - 23 (mg/dL)   Creatinine, Ser 1.61 (*) 0.50 - 1.35 (mg/dL)   Calcium 7.9 (*) 8.4 - 10.5 (mg/dL)   Phosphorus 5.7 (*) 2.3 - 4.6 (mg/dL)   Albumin 2.4 (*) 3.5 - 5.2 (g/dL)   GFR calc non Af Amer 8 (*) >90 (mL/min)   GFR calc Af Amer 9 (*) >90 (mL/min)  CK     Status: Normal   Collection Time   12/23/11  4:52 AM      Component Value Range   Total CK 58  7 - 232 (U/L)  GLUCOSE, CAPILLARY     Status: Abnormal   Collection Time   12/23/11  8:40 AM      Component Value Range   Glucose-Capillary 180 (*) 70  - 99 (mg/dL)   Comment 1 Documented in Chart     Comment 2 Notify RN     Recent Results (from the past 240 hour(s))  URINE CULTURE     Status: Normal   Collection Time   12/13/11 12:55 PM      Component Value Range Status Comment   Specimen Description URINE, CLEAN CATCH   Final    Special Requests NONE   Final    Setup Time [id number]   Final    Colony Count NO GROWTH   Final    Culture NO GROWTH   Final    Report Status 12/14/2011 FINAL   Final   MRSA PCR SCREENING     Status: Normal   Collection Time   12/17/11  2:13 PM      Component Value Range Status Comment   MRSA by PCR NEGATIVE  NEGATIVE  Final     Renal Function:  Basename 12/23/11 0452 12/22/11 1609 12/22/11 0630 12/21/11 0540 12/20/11 0630 12/19/11 0530 12/18/11 0430  CREATININE 6.56* 6.07* 4.96* 2.34* 1.84* 1.84* 1.73*   Estimated Creatinine Clearance: 12 ml/min (by C-G formula based on Cr of 6.56).  Radiologic Imaging: Ct Abdomen Pelvis Wo Contrast  12/22/2011  *RADIOLOGY REPORT*  Clinical Data: Evaluate for small bowel obstruction.  Vomiting. Poor urine output and worsening renal function.  Exclude urinary tract obstruction.  CT ABDOMEN AND PELVIS WITHOUT CONTRAST  Technique:  Multidetector CT imaging of the abdomen and pelvis was performed following the standard protocol without intravenous contrast.  Comparison: CT abdomen pelvis without contrast 12/16/2011.  Findings: There is an airspace opacity with air bronchograms in the anterior right lung base, which were not present on the CT of 12/16/2011.  There is bronchiectasis in both lower lobes.  Patchy opacities in the dependent portions of both lower lobes may reflect atelectasis and/or consolidation.  There are blebs in the lingula and medial segment the right middle lobe.  Cardiomegaly is stable. There is a stable small pericardial effusion.  No definite pleural effusion on today's study.  There is marked right renal atrophy.  This is stable compared to CT of  12/16/2011.  There is no hydronephrosis of the atrophic right kidney.  No evidence of mass or stone.  The left kidney is normal in size.  There is no hydronephrosis, stone, or evidence of mass on the left.  There are scattered calcifications in the renal arteries bilaterally. The ureters are normal in caliber.  Aneurysmal dilatation of the proximal abdominal aorta is stable and measures 4.9 cm AP diameter by 4.7 cm transverse diameter.  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  A single partially calcified gallstone is seen in the gallbladder fundus.  Gallbladder wall appears normal by CT.  The noncontrast appearance of the liver, spleen, adrenal glands, and pancreas is within normal limits.  Stomach is moderately distended.  The duodenum jejunal and proximal ileal loops are dilated, and several contain air contrast levels. Jejunum measures up to 4.2 cm in caliber.  Overall, there is no significant change in bowel dilatation compared to prior study 12/16/2011.  Distal ileal loops are decompressed.  The transition from dilated to decompressed small bowel loops is again noted in the upper pelvis, near the level of the aortoiliac bifurcation.  There is no evidence of bowel wall thickening, pneumatosis, or free intraperitoneal air.  There is a prominent amount of stool in the colon.  There are scattered colonic diverticula from the cecum to the distal sigmoid colon.  Urinary bladder is not very distended.  Prostate gland within normal limits.  The right inguinal hernia is present and contains fat only.  No acute or suspicious bony abnormality.  IMPRESSION:  1.  Persistent small bowel obstruction pattern.  Overall, no significant change in bowel dilatation is appreciated compared to the CT of 12/16/2011.  Transition from dilated small bowel is not seen in the upper anatomic pelvis.  Question adhesions in this patient status post repair  of distal abdominal aortic aneurysm with aorto by iliac graft. 2.  Stable proximal abdominal aortic aneurysm. 3.  Significant right renal atrophy.  Negative for urinary tract obstruction. 4.  Interval development of anterior right basilar airspace disease suspicious for pneumonia or aspiration.  Opacities in both lower lobes could reflect atelectasis and/or airspace disease. 5.  Colonic diverticulosis, uncomplicated. 6.  Right inguinal hernia contains fat. 7.  Cholelithiasis.  Original Report Authenticated By: Britta Mccreedy, M.D.    I independently reviewed the above imaging studies.  Procedure: I prepped his genitalia in the usual sterile fashion and inserted an 18 Fr coude catheter without significant resistance.  There was return of 225 cc of grossly clear urine.  Impression/Assessment:  Difficult urethral catheterization  Plan:  May remove urethral catheter whenever medically appropriate. Please call if further urologic care is required.  Hazely Sealey,LES 12/23/2011, 10:59 AM    Moody Bruins MD

## 2011-12-23 NOTE — Progress Notes (Deleted)
Renal Daily Progress Note  Subjective: Interval History: Overnight emained anuric and hypotensive now to 95/50 after decrease in antihypertensive regimen (DC diltiazem and labetolol, half dose clonidine).  C/o fatigue today. Denies dyspnea, chest pain, emesis, stool.  This am had foley catheter placed and 250cc yellow/dark urine in bag immediately.  Objective:  Vital signs in last 24 hours:  Temp:  [97.5 F (36.4 C)-98.7 F (37.1 C)] 98 F (36.7 C) (01/20 0735) Pulse Rate:  [51-81] 53  (01/20 0930) Resp:  [14-26] 19  (01/20 0930) BP: (93-133)/(50-93) 96/50 mmHg (01/20 0930) SpO2:  [92 %-98 %] 96 % (01/20 0930) Weight:  [159 lb 9.8 oz (72.4 kg)] 159 lb 9.8 oz (72.4 kg) (01/19 2034) Weight change: 1 lb 8.7 oz (0.7 kg)  Intake/Output: I/O last 3 completed shifts: In: 1375 [I.V.:1375] Out: 2 [Emesis/NG output:2]  Intake/Output this shift:  Total I/O In: 250 [I.V.:250] Out: -   EXAM: Gen: alert, lying flat in bed. Neck: no JVD  Chest: CTA anteriorally, no resp distress Heart: II/VI systolic murmur at LLSB  Abdomen: soft, nontender, large scar from AAA repair nondistended, no masses  Ext: no edema LE's, no ischemic changes or gangrene  Neuro: No asterixis, nonfocal motor exam, ox 3   Lab Results:  [facility] 12/22/11 1609  WBC 13.1*  HGB 13.5  HCT 39.1  PLT 287   BMET  Basename 12/23/11 0452 12/22/11 1609 12/22/11 0630  NA 133* 132* 135  K 5.1 5.1 4.5  CL 95* 92* 91*  CO2 20 20 19   GLUCOSE 148* 220* 245*  BUN 69* 58* 47*  CREATININE 6.56* 6.07* 4.96*  CALCIUM 7.9* 9.1 9.9  PHOS 5.7* -- --   LFT  Basename 12/23/11 0452  PROT --  ALBUMIN 2.4*  AST --  ALT --  ALKPHOS --  BILITOT --  BILIDIR --  IBILI --   PT/INR No results found for this basename: LABPROT:2,INR:2 in the last 72 hours Hepatitis Panel No results found for this basename: HEPBSAG,HCVAB,HEPAIGM,HEPBIGM in the last 72 hours PTH: Lab Results  Component Value Date   CALCIUM 7.9* 12/23/2011     CAION 1.21 12/10/2011   PHOS 5.7* 12/23/2011   Lab Results  Component Value Date   CKTOTAL 58 12/23/2011   CKMB 8.9* 12/11/2011   TROPONINI <0.30 12/11/2011    Studies/Results: 12/22/11: CT abd w/o contrast: 1. Persistent small bowel obstruction pattern. Overall, no  significant change in bowel dilatation is appreciated compared to  the CT of 12/16/2011. Transition from dilated small bowel is not  seen in the upper anatomic pelvis. Question adhesions in this  patient status post repair of distal abdominal aortic aneurysm with  aorto by iliac graft.  2. Stable proximal abdominal aortic aneurysm.  3. Significant right renal atrophy. Negative for urinary tract  obstruction.  4. Interval development of anterior right basilar airspace disease  suspicious for pneumonia or aspiration. Opacities in both lower  lobes could reflect atelectasis and/or airspace disease.  5. Colonic diverticulosis, uncomplicated.  6. Right inguinal hernia contains fat.  7. Cholelithiasis.    Assessment/Plan: 1. Acute on CKD, uncertain etiology. Rapid rise in creat and oligo/anuria very concerning. Pt has a nonfunctional right kidney, hx PVD with AAA, most likely due to renovascular disease. Possibly secondary to rapid decline in BP and renal perfusion in the presence of fixed small vessel disease and impaired autoregulation in the renal vasculature, though continues to worsen despite DC of Dilt, labetolol and half dose clonidine he has developed hypotension  and bradycardia perhaps due to poor metabolism of beta blocker with ARF. Bladder outlet obstruction not shown on imaging, but difficulty in passing Foley and had 250cc residual. To evaluate for acute vessel occlusion of the L renal artery (dissection, embolus, severe stenosis), MRA ordered for today. IVF at 125cc/hr continued.  2. CKD, baseline creat 1.3-1.7  3. HTN-Now hypotensive with bradycardia overnight. Possibly excess beta blockade vs alternative cardiac  etiology. 4. PSBO-per primary team and surgery.  5. Hx AAA repair  6. HL  7. DM 2-per primary team.     LOS: 13 [name] PGY-2 12/23/2011 10:38 AM

## 2011-12-23 NOTE — Progress Notes (Signed)
Patient ID: [name], male   DOB: 1951-09-28, 61 y.o.   MRN: [id number]   I came to evaluate patient and place urethral catheter.  I had requested the Urology cart from the OR one hour ago.  Cart has still not arrived at patient bedside.  I will return later to attempt placing catheter once Urology cart has arrived at bedside.

## 2011-12-23 NOTE — Progress Notes (Signed)
Subjective: Stable, had BM, + nausea and hiccups.  No confusion.  Objective Vital signs in last 24 hours: Filed Vitals:   12/23/11 0400 12/23/11 0735 12/23/11 0929 12/23/11 0930  BP: 99/65 101/58 96/50 96/50   Pulse: 59 51  53  Temp: 98.7 F (37.1 C) 98 F (36.7 C)    TempSrc: Oral Oral    Resp: 26 22  19   Height:      Weight:      SpO2: 95% 92%  96%   Weight change: 0.7 kg (1 lb 8.7 oz)  Intake/Output Summary (Last 24 hours) at 12/23/11 1117 Last data filed at 12/23/11 0900  Gross per 24 hour  Intake   1625 ml  Output      0 ml  Net   1625 ml   Labs: Basic Metabolic Panel:  Lab 12/23/11 0454 12/22/11 1609 12/22/11 0630 12/21/11 0540 12/20/11 0630 12/19/11 0530 12/18/11 0430  NA 133* 132* 135 139 139 137 139  K 5.1 5.1 4.5 3.9 3.4* 3.6 3.3*  CL 95* 92* 91* 102 107 106 105  CO2 20 20 19 22 20 22  16*  GLUCOSE 148* 220* 245* 175* 128* 142* 131*  BUN 69* 58* 47* 21 14 16 17   CREATININE 6.56* 6.07* 4.96* 2.34* 1.84* 1.84* 1.73*  ALB -- -- -- -- -- -- --  CALCIUM 7.9* 9.1 9.9 9.4 8.5 8.8 8.5  PHOS 5.7* -- -- -- -- -- --   Liver Function Tests:  Lab 12/23/11 0452  AST --  ALT --  ALKPHOS --  BILITOT --  PROT --  ALBUMIN 2.4*   No results found for this basename: LIPASE:3,AMYLASE:3 in the last 168 hours No results found for this basename: AMMONIA:3 in the last 168 hours CBC:  Lab 12/22/11 1609 12/19/11 0530 12/18/11 0430 12/17/11 0500  WBC 13.1* 10.2 10.4 11.3*  NEUTROABS -- -- -- --  HGB 13.5 11.2* 12.0* 12.3*  HCT 39.1 32.3* 34.4* 36.3*  MCV 85.7 84.1 85.6 86.6  PLT 287 180 181 177   PT/INR: @labrcntip (inr:5) Cardiac Enzymes:  Lab 12/23/11 0452  CKTOTAL 58  CKMB --  CKMBINDEX --  TROPONINI --   CBG:  Lab 12/23/11 0840 12/22/11 2142 12/22/11 1659 12/22/11 1131 12/22/11 0745  GLUCAP 180* 150* 205* 274* 271*    Iron Studies: No results found for this basename: IRON:30,TIBC:30,TRANSFERRIN:30,FERRITIN:30 in the last 168 hours Studies/Results: Ct  Abdomen Pelvis Wo Contrast  12/22/2011  *RADIOLOGY REPORT*  Clinical Data: Evaluate for small bowel obstruction.  Vomiting. Poor urine output and worsening renal function.  Exclude urinary tract obstruction.  CT ABDOMEN AND PELVIS WITHOUT CONTRAST  Technique:  Multidetector CT imaging of the abdomen and pelvis was performed following the standard protocol without intravenous contrast.  Comparison: CT abdomen pelvis without contrast 12/16/2011.  Findings: There is an airspace opacity with air bronchograms in the anterior right lung base, which were not present on the CT of 12/16/2011.  There is bronchiectasis in both lower lobes.  Patchy opacities in the dependent portions of both lower lobes may reflect atelectasis and/or consolidation.  There are blebs in the lingula and medial segment the right middle lobe.  Cardiomegaly is stable. There is a stable small pericardial effusion.  No definite pleural effusion on today's study.  There is marked right renal atrophy.  This is stable compared to CT of 12/16/2011.  There is no hydronephrosis of the atrophic right kidney.  No evidence of mass or stone.  The left kidney is normal in  size.  There is no hydronephrosis, stone, or evidence of mass on the left.  There are scattered calcifications in the renal arteries bilaterally. The ureters are normal in caliber.  Aneurysmal dilatation of the proximal abdominal aorta is stable and measures 4.9 cm AP diameter by 4.7 cm transverse diameter.  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  A single partially calcified gallstone is seen in the gallbladder fundus.  Gallbladder wall appears normal by CT.  The noncontrast appearance of the liver, spleen, adrenal glands, and pancreas is within normal limits.  Stomach is moderately distended.  The duodenum jejunal and proximal ileal loops are dilated, and several contain air contrast levels. Jejunum  measures up to 4.2 cm in caliber.  Overall, there is no significant change in bowel dilatation compared to prior study 12/16/2011.  Distal ileal loops are decompressed.  The transition from dilated to decompressed small bowel loops is again noted in the upper pelvis, near the level of the aortoiliac bifurcation.  There is no evidence of bowel wall thickening, pneumatosis, or free intraperitoneal air.  There is a prominent amount of stool in the colon.  There are scattered colonic diverticula from the cecum to the distal sigmoid colon.  Urinary bladder is not very distended.  Prostate gland within normal limits.  The right inguinal hernia is present and contains fat only.  No acute or suspicious bony abnormality.  IMPRESSION:  1.  Persistent small bowel obstruction pattern.  Overall, no significant change in bowel dilatation is appreciated compared to the CT of 12/16/2011.  Transition from dilated small bowel is not seen in the upper anatomic pelvis.  Question adhesions in this patient status post repair of distal abdominal aortic aneurysm with aorto by iliac graft. 2.  Stable proximal abdominal aortic aneurysm. 3.  Significant right renal atrophy.  Negative for urinary tract obstruction. 4.  Interval development of anterior right basilar airspace disease suspicious for pneumonia or aspiration.  Opacities in both lower lobes could reflect atelectasis and/or airspace disease. 5.  Colonic diverticulosis, uncomplicated. 6.  Right inguinal hernia contains fat. 7.  Cholelithiasis.  Original Report Authenticated By: Britta Mccreedy, M.D.   Medications:    . sodium chloride 10,000 mL (12/23/11 0503)      . bisacodyl  10 mg Rectal BID  . cloNIDine  0.1 mg Oral TID  . insulin aspart  2-10 Units Subcutaneous TID AC & HS  . insulin glargine  12 Units Subcutaneous QHS  . iohexol  20 mL Oral Q1 Hr x 2  . levofloxacin (LEVAQUIN) IV  750 mg Intravenous Q24H  . nicotine  21 mg Transdermal Daily  . vitamin B-12  1,000  mcg Oral Daily  . DISCONTD: cloNIDine  0.2 mg Oral TID  . DISCONTD: diltiazem  120 mg Oral Daily  . DISCONTD: diltiazem  360 mg Oral Daily  . DISCONTD: diltiazem  360 mg Oral Daily  . DISCONTD: labetalol  100 mg Oral BID  . DISCONTD: labetalol  100 mg Oral BID  . DISCONTD: potassium chloride  40 mEq Oral Daily    I  have reviewed scheduled and prn medications.  Physical Exam:  Blood pressure 96/50, pulse 53, temperature 98 F (36.7 C), temperature source Oral, resp. rate 19, height 6\' 3"  (1.905 m), weight 72.4 kg (159 lb 9.8 oz), SpO2 96.00%.  Gen: alert, no distress, calm Skin: no rash, cyanosis Neck: no JVD, bruits or LAN Chest: CTA bilat  Heart: regular, no rub or gallop Abdomen: soft, nontender Ext: no edema Neuro: alert, Ox3, no focal deficit Heme/Lymph: no bruising or LAN  Assessment/Recs:  1. Acute on CKD-  due to hypoperfusion in patient with combination of chronic severe HTN and now with PSBO, N/V and volume depletion with hypotension.  Ordered MRA to r/o vascular crisis in renal artery. However, today with foley in (hx BPH) he is making urine.  BP only low 100's and by weight he is several kg below admit weight. Continue IVF's and holding BP meds except for very high pressures.  Would like to hold of on HD for now- he does have uremic symptoms but is fully oriented and I believe can tolerate this for a little  while awaiting recovery.  2. CKD, baseline creat 1.3-1.7  3. HTN  4. PSBO - had BM today 5. Hx AAA repair  6. HL  7. DM 2   Vinson Moselle, MD [facility] [phone] pager   [phone] cell 12/23/2011, 11:17 AM

## 2011-12-23 NOTE — Progress Notes (Signed)
Agree with above. Pt with recurrent SBO. NPO Consider TNA for nutrition. Will follow.

## 2011-12-23 NOTE — Progress Notes (Signed)
Subjective: He is nauseated. He states to me that he was vomiting last night but to RN he states that he has not been. He has no respiratory symptoms such as cough or dyspnea. He had a BM yesterday and does complain of diffuse, moderate abdominal pain. A foley catheter was yet to be placed when I evaluated him this AM.   Objective:  Vital signs in last 24 hours:  Filed Vitals:   12/23/11 0735 12/23/11 0929 12/23/11 0930 12/23/11 1140  BP: 101/58 96/50 96/50  121/69  Pulse: 51  53 55  Temp: 98 F (36.7 C)   98.2 F (36.8 C)  TempSrc: Oral   Oral  Resp: 22  19 16   Height:      Weight:      SpO2: 92%  96% 96%    Intake/Output from previous day:   Intake/Output Summary (Last 24 hours) at 12/23/11 1332 Last data filed at 12/23/11 1200  Gross per 24 hour  Intake   2375 ml  Output      0 ml  Net   2375 ml    Physical Exam:  General:  Laying in bed in no acute distress. HEENT: no pallor, no icterus,  no JVD, no lymphadenopathy Heart: Normal  s1 &s2  Regular rate and rhythm, without murmurs, rubs, gallops. Lungs: Clear to auscultation bilaterally. Abdomen: Soft, nontender, distended, decreased bowel sounds, tympanic.  Extremities: No clubbing cyanosis or edema with positive pedal pulses. Psych: Alert, awake, oriented x3, nonfocal.   Lab Results:  Basic Metabolic Panel:    Component Value Date/Time   NA 133* 12/23/2011 0452   K 5.1 12/23/2011 0452   CL 95* 12/23/2011 0452   CO2 20 12/23/2011 0452   BUN 69* 12/23/2011 0452   CREATININE 6.56* 12/23/2011 0452   GLUCOSE 148* 12/23/2011 0452   CALCIUM 7.9* 12/23/2011 0452   CBC:    Component Value Date/Time   WBC 13.1* 12/22/2011 1609   HGB 13.5 12/22/2011 1609   HCT 39.1 12/22/2011 1609   PLT 287 12/22/2011 1609   MCV 85.7 12/22/2011 1609   NEUTROABS 7.6 12/12/2011 0530   LYMPHSABS 4.3* 12/12/2011 0530   MONOABS 0.8 12/12/2011 0530   EOSABS 0.4 12/12/2011 0530   BASOSABS 0.0 12/12/2011 0530    Recent Results (from the past 240  hour(s))  MRSA PCR SCREENING     Status: Normal   Collection Time   12/17/11  2:13 PM      Component Value Range Status Comment   MRSA by PCR NEGATIVE  NEGATIVE  Final     Studies/Results: Ct Abdomen Pelvis Wo Contrast  12/22/2011  *RADIOLOGY REPORT*  Clinical Data: Evaluate for small bowel obstruction.  Vomiting. Poor urine output and worsening renal function.  Exclude urinary tract obstruction.  CT ABDOMEN AND PELVIS WITHOUT CONTRAST  Technique:  Multidetector CT imaging of the abdomen and pelvis was performed following the standard protocol without intravenous contrast.  Comparison: CT abdomen pelvis without contrast 12/16/2011.  Findings: There is an airspace opacity with air bronchograms in the anterior right lung base, which were not present on the CT of 12/16/2011.  There is bronchiectasis in both lower lobes.  Patchy opacities in the dependent portions of both lower lobes may reflect atelectasis and/or consolidation.  There are blebs in the lingula and medial segment the right middle lobe.  Cardiomegaly is stable. There is a stable small pericardial effusion.  No definite pleural effusion on today's study.  There is marked right renal atrophy.  This is stable compared to CT of 12/16/2011.  There is no hydronephrosis of the atrophic right kidney.  No evidence of mass or stone.  The left kidney is normal in size.  There is no hydronephrosis, stone, or evidence of mass on the left.  There are scattered calcifications in the renal arteries bilaterally. The ureters are normal in caliber.  Aneurysmal dilatation of the proximal abdominal aorta is stable and measures 4.9 cm AP diameter by 4.7 cm transverse diameter.  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  A single partially calcified gallstone is seen in the gallbladder fundus.  Gallbladder wall appears normal by CT.  The noncontrast appearance of the liver, spleen,  adrenal glands, and pancreas is within normal limits.  Stomach is moderately distended.  The duodenum jejunal and proximal ileal loops are dilated, and several contain air contrast levels. Jejunum measures up to 4.2 cm in caliber.  Overall, there is no significant change in bowel dilatation compared to prior study 12/16/2011.  Distal ileal loops are decompressed.  The transition from dilated to decompressed small bowel loops is again noted in the upper pelvis, near the level of the aortoiliac bifurcation.  There is no evidence of bowel wall thickening, pneumatosis, or free intraperitoneal air.  There is a prominent amount of stool in the colon.  There are scattered colonic diverticula from the cecum to the distal sigmoid colon.  Urinary bladder is not very distended.  Prostate gland within normal limits.  The right inguinal hernia is present and contains fat only.  No acute or suspicious bony abnormality.  IMPRESSION:  1.  Persistent small bowel obstruction pattern.  Overall, no significant change in bowel dilatation is appreciated compared to the CT of 12/16/2011.  Transition from dilated small bowel is not seen in the upper anatomic pelvis.  Question adhesions in this patient status post repair of distal abdominal aortic aneurysm with aorto by iliac graft. 2.  Stable proximal abdominal aortic aneurysm. 3.  Significant right renal atrophy.  Negative for urinary tract obstruction. 4.  Interval development of anterior right basilar airspace disease suspicious for pneumonia or aspiration.  Opacities in both lower lobes could reflect atelectasis and/or airspace disease. 5.  Colonic diverticulosis, uncomplicated. 6.  Right inguinal hernia contains fat. 7.  Cholelithiasis.  Original Report Authenticated By: Britta Mccreedy, M.D.    Medications: Scheduled Meds:    . bisacodyl  10 mg Rectal BID  . cloNIDine  0.1 mg Oral TID  . insulin glargine  12 Units Subcutaneous QHS  . iohexol  20 mL Oral Q1 Hr x 2  .  levofloxacin (LEVAQUIN) IV  750 mg Intravenous Q24H  . nicotine  21 mg Transdermal Daily  . nystatin  10 mL Oral QID  . sodium chloride  500 mL Intravenous Once  . vitamin B-12  1,000 mcg Oral Daily  . DISCONTD: cloNIDine  0.2 mg Oral TID  . DISCONTD: diltiazem  120 mg Oral Daily  . DISCONTD: diltiazem  360 mg Oral Daily  . DISCONTD: diltiazem  360 mg Oral Daily  . DISCONTD: insulin aspart  2-10 Units Subcutaneous TID AC & HS  . DISCONTD: labetalol  100 mg Oral BID  . DISCONTD: labetalol  100 mg Oral BID  . DISCONTD: potassium chloride  40 mEq Oral Daily   Continuous Infusions:    . sodium chloride 10,000 mL (12/23/11 0503)   PRN Meds:.acetaminophen, dextrose, HYDROcodone-acetaminophen, morphine injection, ondansetron (  ZOFRAN) IV, DISCONTD: hydrALAZINE   Assessment  61 y/o Male with PMHx of DM, HTN, CVA with residual weakness, abdominal aneurysm s/p repair was admitted on 1/8 with DKA. On 1/10 patient c/o abdominal pain with nausea/vomiting , KUB showed SBO. [place] sx was consulted and recommended conservative management. CT of the abdomen / pelvis demonstrated mid small bowel obstruction secondary to likely an adhesion in the upper central pelvis. At the same time, his BP became difficult to control as he was NPO and required IV Labetalol gtt with tx to ICU for management. Patient now improved and transferred to medial floor.   Plan:   AKI on chronic kidney disease  Further worsening of creatinine noted.   CT abd and pelvis reveals atrophic rt kidney which is likely from medical-renal disease Foley inserted by Dr Laverle Patter.  Started on IV NS Adjusted antihypertensives.  MRI abdomen ordered by Dr Arta Silence to look for renal artery stenosis  Hematuria Possibly related to traumatic catheterization. Had copious amount  Blood clot with foley removala nd also frsh bleeding after that which has now stopped. H&h wnl. Type and screen ordered  will transfer patient to stepdown  Malignant  HTN  BP dropped to 90s systolic. D/c'd cardizem and labetalol  Changed CLonidine to PRN for sbp > 180. May have rebound HTN.   Partial SBO  still has some abdominal distention with continued obstruction seen on Ct.  Keep pt NPO except meds. cont zofran  I asked surgery to come back on the case.   Hypoxia with low grade fever  noted to desaturate on RA on 1/18  stable on 2-3 L  CXR shows basilar opacity possible for PNA but he is asymptomatic. started on IV levaquin since 1/18 Prn nebs   DM w/ DKA on presentation  CBGs are now well controlled  Q4h sliding scale while NPO. Cont lantus.   ABDOMINAL AORTIC ANEURYSM w/ hx of repair  Stable   Mild Hyponatremia- on saline infusion  Thrush- nystatin  Nicotine abuse  DVT prophylaxis: SCD     LOS: 13 days   Shogo Larkey 12/23/2011, 1:32 PM

## 2011-12-23 NOTE — Progress Notes (Signed)
Lenny Pastel notified that pt did not have a catheter and per Dr. Gonzella Lex note, a Coude was to placed.  No order for Coude in chart.  Order received to place Coude catheter and to notify [employer] Billings of result.  Also notified PA that pt hr dipped to 39 twice in night but blood pressure 105/67.    Attempted to place 16 fr Coude catheter.  Met resistance and could not insert coude.  Pt complained of 8 out of 10 pain.  Coude removed, blood noted to tip of coude.  Pt stated pain was gone.  Benedetto Coons notified of inability to place coude. Dr. Laverle Patter notified by PA and will see pt in am.  Pt does not complain of fullness or need to void.

## 2011-12-23 NOTE — Progress Notes (Signed)
Subjective: Asked to re-evaluate this pt we previously saw for PSBO. He was doing well, having Bms, passing flatus, and tolerating liquid diet. However, a few days ago, his abdomen became distended again and he had several episodes of N/V. His diet was again restricted. He does state though that he has continued to have flatus and had a BM last night. He's had some UOP issues and a foley was just placed by Urology yielding a good amount of urine output. He currently feels pretty good, denies bloating or nausea. CT scan was ordered yesterday and found dilated loops of SB with decompressed distal bowel c/w persistent SBO.  Surgery is asked to resume involvement.  Objective: Vital signs in last 24 hours: Temp:  [97.5 F (36.4 C)-98.7 F (37.1 C)] 98 F (36.7 C) (01/20 0735) Pulse Rate:  [51-81] 53  (01/20 0930) Resp:  [14-26] 19  (01/20 0930) BP: (93-133)/(50-93) 96/50 mmHg (01/20 0930) SpO2:  [92 %-98 %] 96 % (01/20 0930) Weight:  [72.4 kg (159 lb 9.8 oz)] 72.4 kg (159 lb 9.8 oz) (01/19 2034) Last BM Date: 12/19/11  Intake/Output this shift: Total I/O In: 250 [I.V.:250] Out: -   Physical Exam: BP 96/50  Pulse 53  Temp(Src) 98 F (36.7 C) (Oral)  Resp 19  Ht 6\' 3"  (1.905 m)  Wt 72.4 kg (159 lb 9.8 oz)  BMI 19.95 kg/m2  SpO2 96% Abdomen: soft, minimal distention at present time. Few BS heard. No tenderness, no masses.  Labs: CBC  Basename 12/22/11 1609  WBC 13.1*  HGB 13.5  HCT 39.1  PLT 287   BMET  Basename 12/23/11 0452 12/22/11 1609  NA 133* 132*  K 5.1 5.1  CL 95* 92*  CO2 20 20  GLUCOSE 148* 220*  BUN 69* 58*  CREATININE 6.56* 6.07*  CALCIUM 7.9* 9.1   LFT  Basename 12/23/11 0452  PROT --  ALBUMIN 2.4*  AST --  ALT --  ALKPHOS --  BILITOT --  BILIDIR --  IBILI --  LIPASE --   PT/INR No results found for this basename: LABPROT:2,INR:2 in the last 72 hours ABG No results found for this basename: PHART:2,PCO2:2,PO2:2,HCO3:2 in the last 72  hours  Studies/Results: Ct Abdomen Pelvis Wo Contrast  12/22/2011  *RADIOLOGY REPORT*  Clinical Data: Evaluate for small bowel obstruction.  Vomiting. Poor urine output and worsening renal function.  Exclude urinary tract obstruction.  CT ABDOMEN AND PELVIS WITHOUT CONTRAST  Technique:  Multidetector CT imaging of the abdomen and pelvis was performed following the standard protocol without intravenous contrast.  Comparison: CT abdomen pelvis without contrast 12/16/2011.  Findings: There is an airspace opacity with air bronchograms in the anterior right lung base, which were not present on the CT of 12/16/2011.  There is bronchiectasis in both lower lobes.  Patchy opacities in the dependent portions of both lower lobes may reflect atelectasis and/or consolidation.  There are blebs in the lingula and medial segment the right middle lobe.  Cardiomegaly is stable. There is a stable small pericardial effusion.  No definite pleural effusion on today's study.  There is marked right renal atrophy.  This is stable compared to CT of 12/16/2011.  There is no hydronephrosis of the atrophic right kidney.  No evidence of mass or stone.  The left kidney is normal in size.  There is no hydronephrosis, stone, or evidence of mass on the left.  There are scattered calcifications in the renal arteries bilaterally. The ureters are normal in caliber.  Aneurysmal  dilatation of the proximal abdominal aorta is stable and measures 4.9 cm AP diameter by 4.7 cm transverse diameter.  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  A single partially calcified gallstone is seen in the gallbladder fundus.  Gallbladder wall appears normal by CT.  The noncontrast appearance of the liver, spleen, adrenal glands, and pancreas is within normal limits.  Stomach is moderately distended.  The duodenum jejunal and proximal ileal loops are dilated, and several contain air  contrast levels. Jejunum measures up to 4.2 cm in caliber.  Overall, there is no significant change in bowel dilatation compared to prior study 12/16/2011.  Distal ileal loops are decompressed.  The transition from dilated to decompressed small bowel loops is again noted in the upper pelvis, near the level of the aortoiliac bifurcation.  There is no evidence of bowel wall thickening, pneumatosis, or free intraperitoneal air.  There is a prominent amount of stool in the colon.  There are scattered colonic diverticula from the cecum to the distal sigmoid colon.  Urinary bladder is not very distended.  Prostate gland within normal limits.  The right inguinal hernia is present and contains fat only.  No acute or suspicious bony abnormality.  IMPRESSION:  1.  Persistent small bowel obstruction pattern.  Overall, no significant change in bowel dilatation is appreciated compared to the CT of 12/16/2011.  Transition from dilated small bowel is not seen in the upper anatomic pelvis.  Question adhesions in this patient status post repair of distal abdominal aortic aneurysm with aorto by iliac graft. 2.  Stable proximal abdominal aortic aneurysm. 3.  Significant right renal atrophy.  Negative for urinary tract obstruction. 4.  Interval development of anterior right basilar airspace disease suspicious for pneumonia or aspiration.  Opacities in both lower lobes could reflect atelectasis and/or airspace disease. 5.  Colonic diverticulosis, uncomplicated. 6.  Right inguinal hernia contains fat. 7.  Cholelithiasis.  Original Report Authenticated By: Britta Mccreedy, M.D.    Assessment: Principal Problem:  *DKA (diabetic ketoacidoses) Active Problems:  HYPERTENSION  ABDOMINAL AORTIC ANEURYSM REPAIR, HX OF  Hyperlipidemia Persistent/Recurrent SBO  Plan: CT certainly concerning for persistent SBO with a setback in his clinical progressfrom when we last saw him.  However, he looks fairly comfortable this morning, had a BM  last night after CT scan and denies any pain or nausea. Will hold off NGT since he hasn't vomited in 2 days. Would keep NPO and agree that suppositories are ok to help with retained stool burden. Will follow along and check plain films in the morning. Encouraged OOB to chair, walking if allowed.   Marianna Fuss 12/23/2011

## 2011-12-24 ENCOUNTER — Other Ambulatory Visit: Payer: Self-pay

## 2011-12-24 ENCOUNTER — Inpatient Hospital Stay (HOSPITAL_COMMUNITY): Payer: Medicare Other

## 2011-12-24 DIAGNOSIS — K56609 Unspecified intestinal obstruction, unspecified as to partial versus complete obstruction: Secondary | ICD-10-CM

## 2011-12-24 LAB — RENAL FUNCTION PANEL
BUN: 53 mg/dL — ABNORMAL HIGH (ref 6–23)
Chloride: 106 mEq/L (ref 96–112)
Glucose, Bld: 73 mg/dL (ref 70–99)
Potassium: 4.2 mEq/L (ref 3.5–5.1)

## 2011-12-24 LAB — GLUCOSE, CAPILLARY
Glucose-Capillary: 140 mg/dL — ABNORMAL HIGH (ref 70–99)
Glucose-Capillary: 82 mg/dL (ref 70–99)
Glucose-Capillary: 92 mg/dL (ref 70–99)

## 2011-12-24 LAB — MRSA PCR SCREENING: MRSA by PCR: NEGATIVE

## 2011-12-24 MED ORDER — INSULIN ASPART 100 UNIT/ML ~~LOC~~ SOLN
0.0000 [IU] | SUBCUTANEOUS | Status: DC
  Administered 2011-12-25: 2 [IU] via SUBCUTANEOUS
  Administered 2011-12-25: 3 [IU] via SUBCUTANEOUS
  Administered 2011-12-25 – 2011-12-26 (×2): 1 [IU] via SUBCUTANEOUS
  Administered 2011-12-26 (×2): 2 [IU] via SUBCUTANEOUS

## 2011-12-24 MED ORDER — NITROGLYCERIN 0.4 MG SL SUBL
SUBLINGUAL_TABLET | SUBLINGUAL | Status: AC
  Administered 2011-12-24: 1.2 mg
  Filled 2011-12-24: qty 75

## 2011-12-24 MED ORDER — HYDRALAZINE HCL 20 MG/ML IJ SOLN
10.0000 mg | Freq: Three times a day (TID) | INTRAMUSCULAR | Status: DC | PRN
  Administered 2011-12-24: 10 mg via INTRAVENOUS
  Filled 2011-12-24: qty 1

## 2011-12-24 MED ORDER — DEXTROSE 50 % IV SOLN: 25.0000 mL | Freq: Once | INTRAVENOUS | Status: AC | PRN

## 2011-12-24 MED ORDER — LEVOFLOXACIN IN D5W 250 MG/50ML IV SOLN
250.0000 mg | INTRAVENOUS | Status: DC
  Administered 2011-12-24 – 2011-12-28 (×5): 250 mg via INTRAVENOUS
  Filled 2011-12-24 (×7): qty 50

## 2011-12-24 MED ORDER — LABETALOL HCL 100 MG PO TABS
100.0000 mg | ORAL_TABLET | Freq: Once | ORAL | Status: AC
  Administered 2011-12-24: 100 mg via ORAL
  Filled 2011-12-24: qty 1

## 2011-12-24 MED ORDER — AMLODIPINE BESYLATE 10 MG PO TABS: 10.0000 mg | ORAL_TABLET | Freq: Every day | ORAL | Status: DC

## 2011-12-24 MED ORDER — HYDRALAZINE HCL 20 MG/ML IJ SOLN: 10.0000 mg | Freq: Three times a day (TID) | INTRAMUSCULAR | Status: DC

## 2011-12-24 MED ORDER — HYDRALAZINE HCL 20 MG/ML IJ SOLN
INTRAMUSCULAR | Status: AC
  Administered 2011-12-24: 10 mg via INTRAVENOUS
  Filled 2011-12-24: qty 1

## 2011-12-24 MED ORDER — LABETALOL HCL 100 MG PO TABS
100.0000 mg | ORAL_TABLET | Freq: Two times a day (BID) | ORAL | Status: DC
  Administered 2011-12-24 – 2011-12-25 (×3): 100 mg via ORAL
  Filled 2011-12-24 (×7): qty 1

## 2011-12-24 NOTE — Progress Notes (Signed)
Pt. Complains of 8/10 chest pain.  Blood pressure at this time is 158/83.  Heart rate 88.  Patient breathing normally, no distress. SL Nitro given to patient at 2025.  BP at this time 143/81.   2030 patient states that he has relief. Vitals remain stable and patient continues to breathe normally without labor. [facility] paged.  Maren Reamer returned page at 2035.   Will monitor patient.

## 2011-12-24 NOTE — Progress Notes (Signed)
Renal Daily Progress Note  Subjective: Interval History: Urine output significantly improved after foley catheter placed. UOP 2225. Net positive 1168. Having some emesis overnight. creatinine down as well  Objective:  Vital signs in last 24 hours:  Temp:  [98 F (36.7 C)-99 F (37.2 C)] 98.8 F (37.1 C) (01/21 0400) Pulse Rate:  [51-89] 89  (01/21 0400) Resp:  [16-22] 20  (01/21 0400) BP: (96-168)/(50-90) 168/90 mmHg (01/21 0400) SpO2:  [91 %-98 %] 95 % (01/21 0400) Weight change:   Intake/Output: I/O last 3 completed shifts: In: 4770 [P.O.:120; I.V.:4000; IV Piggyback:650] Out: 2227 [Urine:2225; Emesis/NG output:1; Stool:1]  Intake/Output this shift:     EXAM: Gen: alert, lying flat in bed. Answers questions appropriately. Chest: CTA anteriorally, no resp distress Heart: II/VI systolic murmur at LLSB  Abdomen: soft, nontender, large scar from AAA repair nondistended, no masses  Ext: no edema LE's, no ischemic changes or gangrene  Neuro: No asterixis, nonfocal motor exam, a/ox 3   Lab Results:  [facility] 12/22/11 1609  WBC 13.1*  HGB 13.5  HCT 39.1  PLT 287   BMET  Basename 12/24/11 0500 12/23/11 0452 12/22/11 1609  NA 137 133* 132*  K 4.2 5.1 5.1  CL 106 95* 92*  CO2 18* 20 20  GLUCOSE 73 148* 220*  BUN 53* 69* 58*  CREATININE 3.86* 6.56* 6.07*  CALCIUM 8.2* 7.9* 9.1  PHOS 3.7 5.7* --   LFT  Basename 12/24/11 0500  PROT --  ALBUMIN 2.4*  AST --  ALT --  ALKPHOS --  BILITOT --  BILIDIR --  IBILI --   PT/INR No results found for this basename: LABPROT:2,INR:2 in the last 72 hours Hepatitis Panel No results found for this basename: HEPBSAG,HCVAB,HEPAIGM,HEPBIGM in the last 72 hours PTH: Lab Results  Component Value Date   CALCIUM 8.2* 12/24/2011   CAION 1.21 12/10/2011   PHOS 3.7 12/24/2011   Lab Results  Component Value Date   CKTOTAL 58 12/23/2011   CKMB 8.9* 12/11/2011   TROPONINI <0.30 12/11/2011   UA: 1.015, tr hgb, 100 prot, 0-2 RBC,  hyaline casts.  Studies/Results: 12/22/11: CT abd w/o contrast: 1. Persistent small bowel obstruction pattern. Overall, no  significant change in bowel dilatation is appreciated compared to  the CT of 12/16/2011. Transition from dilated small bowel is not  seen in the upper anatomic pelvis. Question adhesions in this  patient status post repair of distal abdominal aortic aneurysm with  aorto by iliac graft.  2. Stable proximal abdominal aortic aneurysm.  3. Significant right renal atrophy. Negative for urinary tract  obstruction.  4. Interval development of anterior right basilar airspace disease  suspicious for pneumonia or aspiration. Opacities in both lower  lobes could reflect atelectasis and/or airspace disease.  5. Colonic diverticulosis, uncomplicated.  6. Right inguinal hernia contains fat.  7. Cholelithiasis.  MRI/MRA pending.    Assessment/Plan: 1. Acute on CKD, likely secondary to rapid decline in blood pressure in setting of chronic hypertension. This has improved with stopping antihypertensives and rebound of BP to 160 sys range. Possibly a component of bladder outlet obstruction in setting of difficulty in passing Foley. MRA pending to rule out vascular pathology in setting of single atrophic rt kidney. IVF at 125cc/hr continued, willl decrease to 50/hour for now. Recommend permissive HTN range of 160-180 then stepwise addition of low dose antihypertensives. Will start back low dose labetalol, worry about clonidine in noncompliant patient Urology following for possible need of BPH treatment. 2. CKD, baseline creat  1.3-1.7  3. HTN-Now returning near baseline. May need restart of single agent later tonight or in am. 4. PSBO-per primary team and surgery.  5. Hx AAA repair  6. HL  7. DM 2-per primary team.    LOS: 14 KONKOL,JILL PGY-2 12/24/2011 7:13 AM  Patient seen and examined, agree with above note with above modifications.  Annie Sable, MD 12/24/2011

## 2011-12-24 NOTE — Progress Notes (Signed)
Physical Therapy Evaluation Patient Details Name: [name] MRN: [id number] DOB: September 16, 1951 Today's Date: 12/24/2011  Problem List:  Patient Active Problem List  Diagnoses  . GOUT  . HYPERTENSION  . BRADYCARDIA  . ABDOMINAL AORTIC ANEURYSM, HX OF  . ABDOMINAL AORTIC ANEURYSM REPAIR, HX OF  . APPENDECTOMY, HX OF  . INGUINAL HERNIORRHAPHY, HX OF  . DKA (diabetic ketoacidoses)  . Hyperlipidemia  . Partial SBO (small bowel obstruction)-most likely due to adhesions.    Past Medical History:  Past Medical History  Diagnosis Date  . Diabetes mellitus   . Hypertension   . Aneurysm of aorta   . Aneurysm of abdominal aorta   . Stroke     CVA effected  eye  & leg  . Cataract, bilateral   . Renal disorder    Past Surgical History:  Past Surgical History  Procedure Date  . Abdominal aortic aneurysm repair   . Hernia repair     PT Assessment/Plan/Recommendation PT Recommendation Follow Up Recommendations: Home health PT;Supervision/Assistance - 24 hour Equipment Recommended: None recommended by PT PT Goals  Acute Rehab PT Goals PT Goal: Supine/Side to Sit - Progress: Progressing toward goal PT Goal: Sit to Stand - Progress: Progressing toward goal PT Goal: Stand to Sit - Progress: Progressing toward goal PT Goal: Ambulate - Progress: Progressing toward goal  PT Evaluation Precautions/Restrictions  Precautions Precautions: Fall Mobility (including Balance) Bed Mobility Rolling Right: 7: Independent Right Sidelying to Sit: 5: Supervision Right Sidelying to Sit Details (indicate cue type and reason): Increased time only - no safety issues Transfers Sit to Stand: 4: Min assist;From elevated surface;From bed;With upper extremity assist Sit to Stand Details (indicate cue type and reason): Practised standing from bed to improve anterior weight shift. Patient attempts to stand prior to scooting to edge of bed. Stand to Sit: 5: Supervision;With upper extremity  assist;To chair/3-in-1 Stand to Sit Details: Good control of descent Ambulation/Gait Ambulation/Gait Assistance: 5: Supervision Ambulation/Gait Assistance Details (indicate cue type and reason): Mild deviation from path on two occassions both occured post someone walking past patient, but not close enough to require patient to move to the side. No loss of balance today. Ambulation Distance (Feet): 200 Feet Assistive device: Straight cane Gait Pattern: Decreased stride length;Step-through pattern  Static Sitting Balance Static Sitting - Balance Support: Feet unsupported;No upper extremity supported Static Sitting - Level of Assistance: 7: Independent Static Standing Balance Static Standing - Balance Support: Right upper extremity supported Static Standing - Level of Assistance: 5: Stand by assistance Static Standing - Comment/# of Minutes: Cane for support End of Session PT - End of Session Equipment Utilized During Treatment: Gait belt Activity Tolerance: Patient tolerated treatment well Patient left: in chair;with call bell in reach Nurse Communication: Mobility status for ambulation General Behavior During Session: [facility] for tasks performed Cognition: [facility] for tasks performed  Edwyna Perfect, PT  Pager [phone] 12/24/2011, 10:27 AM

## 2011-12-24 NOTE — Progress Notes (Signed)
Report called to [facility].  Pt transferred via wheelchair with O2 and belongings.    Roselie Awkward, RN

## 2011-12-24 NOTE — Progress Notes (Addendum)
Monitor tech called rn stated patient had 20 beats vtach strip printed and put to chart. Dr. Runell Gess notified.  Pt assesed he stated he was drinking water and was also having the hiccups at the time. Pt bp 199/107.

## 2011-12-24 NOTE — Significant Event (Signed)
S: RN called this NP secondary to pt c/o chest pain. This began about 2000 hrs. NTG x 3 given with short term relief but pain restarted around 2040. NP to bedside. Pt states pain is sharp and located in the xyphoid process area. Pain is not as bad as it was at 2000hrs.  He denies anxiety, n/v, diaphoresis, jaw pain or arm pain. Denies SOB.  O: VSS (BP was up, now better after NTG). Appears well. A&O x3. S1S2, RRR, tele with NSR. Resp effort is normal. Lungs CTA. Abd with BS+x 4. Soft, NT, ND. There is mild reproduction of his pain when the distal sternum is palpated at the xyphoid area.  A/P: CP-perhaps secondary to BP being up. Now improved. Will order serial cardiac enzymes and stat EKG. He was admitted for DKA and sugars are stable.  Will follow. Nurse to call if concerned or changes in status.

## 2011-12-24 NOTE — Progress Notes (Signed)
Patient ID: [name], male   DOB: 11/28/1951, 61 y.o.   MRN: [id number]  Subjective: No events overnight. Patient denies chest pain, shortness of breath, abdominal pain. Had not had bowel movement.  Objective:  Vital signs in last 24 hours:  Filed Vitals:   12/23/11 2000 12/24/11 0000 12/24/11 0400 12/24/11 0800  BP: 128/68 148/87 168/90 152/81  Pulse: 76 87 89 79  Temp: 98.3 F (36.8 C) 99 F (37.2 C) 98.8 F (37.1 C) 99 F (37.2 C)  TempSrc: Oral Oral Oral Oral  Resp: 19 20 20 19   Height:      Weight:      SpO2: 98% 94% 95% 95%    Intake/Output from previous day:   Intake/Output Summary (Last 24 hours) at 12/24/11 0853 Last data filed at 12/24/11 0800  Gross per 24 hour  Intake   3520 ml  Output   2877 ml  Net    643 ml    Physical Exam: General: Alert, awake, oriented x3, in no acute distress. HEENT: No bruits, no goiter. Moist mucous membranes, no scleral icterus, no conjunctival pallor. Heart: Regular rate and rhythm, S1/S2 +, no murmurs, rubs, gallops. Lungs: Clear to auscultation bilaterally. No wheezing, no rhonchi, no rales.  Abdomen: Soft, nontender, nondistended, positive bowel sounds. Extremities: No clubbing or cyanosis, no pitting edema,  positive pedal pulses. Neuro: Grossly nonfocal.  Lab Results:  Basic Metabolic Panel:    Component Value Date/Time   NA 137 12/24/2011 0500   K 4.2 12/24/2011 0500   CL 106 12/24/2011 0500   CO2 18* 12/24/2011 0500   BUN 53* 12/24/2011 0500   CREATININE 3.86* 12/24/2011 0500   GLUCOSE 73 12/24/2011 0500   CALCIUM 8.2* 12/24/2011 0500   CBC:    Component Value Date/Time   WBC 13.1* 12/22/2011 1609   HGB 13.5 12/22/2011 1609   HCT 39.1 12/22/2011 1609   PLT 287 12/22/2011 1609   MCV 85.7 12/22/2011 1609   NEUTROABS 7.6 12/12/2011 0530   LYMPHSABS 4.3* 12/12/2011 0530   MONOABS 0.8 12/12/2011 0530   EOSABS 0.4 12/12/2011 0530   BASOSABS 0.0 12/12/2011 0530      Lab 12/22/11 1609 12/19/11 0530 12/18/11 0430    WBC 13.1* 10.2 10.4  HGB 13.5 11.2* 12.0*  HCT 39.1 32.3* 34.4*  PLT 287 180 181  MCV 85.7 84.1 85.6  MCH 29.6 29.2 29.9  MCHC 34.5 34.7 34.9  RDW 14.7 14.3 14.5  LYMPHSABS -- -- --  MONOABS -- -- --  EOSABS -- -- --  BASOSABS -- -- --  BANDABS -- -- --    Lab 12/24/11 0500 12/23/11 0452 12/22/11 1609 12/22/11 0630 12/21/11 0540 12/20/11 0630  NA 137 133* 132* 135 139 --  K 4.2 5.1 5.1 4.5 3.9 --  CL 106 95* 92* 91* 102 --  CO2 18* 20 20 19 22  --  GLUCOSE 73 148* 220* 245* 175* --  BUN 53* 69* 58* 47* 21 --  CREATININE 3.86* 6.56* 6.07* 4.96* 2.34* --  CALCIUM 8.2* 7.9* 9.1 9.9 9.4 --  MG -- -- -- -- -- 1.5   No results found for this basename: INR:5,PROTIME:5 in the last 168 hours Cardiac markers: No results found for this basename: CK:3,CKMB:3,TROPONINI:3,MYOGLOBIN:3 in the last 168 hours No components found with this basename: POCBNP:3 Recent Results (from the past 240 hour(s))  MRSA PCR SCREENING     Status: Normal   Collection Time   12/17/11  2:13 PM  Component Value Range Status Comment   MRSA by PCR NEGATIVE  NEGATIVE  Final     Studies/Results: Ct Abdomen Pelvis Wo Contrast 12/22/2011  .  IMPRESSION:  1.  Persistent small bowel obstruction pattern.  Overall, no significant change in bowel dilatation is appreciated compared to the CT of 12/16/2011.  Transition from dilated small bowel is not seen in the upper anatomic pelvis.  Question adhesions in this patient status post repair of distal abdominal aortic aneurysm with aorto by iliac graft. 2.  Stable proximal abdominal aortic aneurysm. 3.  Significant right renal atrophy.  Negative for urinary tract obstruction. 4.  Interval development of anterior right basilar airspace disease suspicious for pneumonia or aspiration.  Opacities in both lower lobes could reflect atelectasis and/or airspace disease. 5.  Colonic diverticulosis, uncomplicated. 6.  Right inguinal hernia contains fat. 7.  Cholelithiasis.    Dg Abd 2  Views 12/24/2011   Impression: Nonspecific although abnormal bowel gas pattern as noted above.     Medications: Scheduled Meds:   . bisacodyl  10 mg Rectal BID  . insulin glargine  12 Units Subcutaneous QHS  . levofloxacin (LEVAQUIN) IV  750 mg Intravenous Q24H  . nicotine  21 mg Transdermal Daily  . nystatin  10 mL Oral QID  . sodium chloride  500 mL Intravenous Once  . vitamin B-12  1,000 mcg Oral Daily  . DISCONTD: cloNIDine  0.1 mg Oral TID  . DISCONTD: insulin aspart  2-10 Units Subcutaneous TID AC & HS   Continuous Infusions:   . sodium chloride 125 mL/hr at 12/24/11 0800   PRN Meds:.acetaminophen, cloNIDine, dextrose, dextrose, HYDROcodone-acetaminophen, morphine injection, ondansetron (ZOFRAN) IV  Assessment/Plan:  AKI on chronic kidney disease  Creatinine trending down Appreciate nephrology recommendations Obtain BMP in AM  Hematuria  Possibly related to traumatic catheterization. Had copious amount Blood clot with foley removala nd also frsh bleeding after that which has now stopped. H&h wnl. Monitor  Malignant HTN  BP dropped to 90s systolic. Stable in AM but I have received phone call from floor nurse that BP is in 180-190 Started hydralazine PRN Monitor vitals per floor protocol  Partial SBO  still has some abdominal distention with continued obstruction seen on Ct.  Keep pt NPO except meds.  cont zofran  Follow up on surgery recommendations  Hypoxia with low grade fever  Maintaining oxygen saturations above 95% on 2 L [name] CXR shows basilar opacity possible for PNA but he is asymptomatic.  started on IV levaquin since 1/18  Prn nebs   DM w/ DKA on presentation  CBGs are now well controlled  Q4h sliding scale while NPO. Cont lantus.   DVT prophylaxis: SCD    EDUCATION - test results and diagnostic studies were discussed with patient  - patient  verbalized the understanding - questions were answered at the bedside and contact information was  provided for additional questions or concerns   LOS: 14 days   [name] 12/24/2011, 8:53 AM  [facility] Pager: [phone]

## 2011-12-24 NOTE — Progress Notes (Signed)
Maren Reamer, PA paged again at 2042.  Patient c/o chest pain again.   BP = 142/83.  HR=97.  O2 saturation 95%.  Pt has no labored breathing. SL Nitro given.   Maren Reamer, NP came to room and assessed patient.   Will continue to monitor patient.

## 2011-12-24 NOTE — Progress Notes (Signed)
Subjective: Denies nausea or vomiting.  Has flatus and BM.  Objective: Vital signs in last 24 hours: Temp:  [98 F (36.7 C)-99 F (37.2 C)] 99 F (37.2 C) (01/21 0800) Pulse Rate:  [53-89] 79  (01/21 0800) Resp:  [16-21] 19  (01/21 0800) BP: (96-168)/(50-90) 152/81 mmHg (01/21 0800) SpO2:  [91 %-98 %] 95 % (01/21 0800) Last BM Date: 12/24/11  Intake/Output from previous day: 01/20 0701 - 01/21 0700 In: 3520 [P.O.:120; I.V.:2750; IV Piggyback:650] Out: 2227 [Urine:2225; Emesis/NG output:1; Stool:1] Intake/Output this shift: Total I/O In: 125 [I.V.:125] Out: 650 [Urine:650]  PE:  Abd-soft, nontender, nondistended, few BS  Lab Results:   [facility] 12/22/11 1609  WBC 13.1*  HGB 13.5  HCT 39.1  PLT 287   BMET  Basename 12/24/11 0500 12/23/11 0452  NA 137 133*  K 4.2 5.1  CL 106 95*  CO2 18* 20  GLUCOSE 73 148*  BUN 53* 69*  CREATININE 3.86* 6.56*  CALCIUM 8.2* 7.9*   PT/INR No results found for this basename: LABPROT:2,INR:2 in the last 72 hours Comprehensive Metabolic Panel:    Component Value Date/Time   NA 137 12/24/2011 0500   K 4.2 12/24/2011 0500   CL 106 12/24/2011 0500   CO2 18* 12/24/2011 0500   BUN 53* 12/24/2011 0500   CREATININE 3.86* 12/24/2011 0500   GLUCOSE 73 12/24/2011 0500   CALCIUM 8.2* 12/24/2011 0500   AST 14 12/10/2011 2339   ALT 11 12/10/2011 2339   ALKPHOS 102 12/10/2011 2339   BILITOT 0.2* 12/10/2011 2339   PROT 8.3 12/10/2011 2339   ALBUMIN 2.4* 12/24/2011 0500     Studies/Results: Ct Abdomen Pelvis Wo Contrast  12/22/2011  *RADIOLOGY REPORT*  Clinical Data: Evaluate for small bowel obstruction.  Vomiting. Poor urine output and worsening renal function.  Exclude urinary tract obstruction.  CT ABDOMEN AND PELVIS WITHOUT CONTRAST  Technique:  Multidetector CT imaging of the abdomen and pelvis was performed following the standard protocol without intravenous contrast.  Comparison: CT abdomen pelvis without contrast 12/16/2011.  Findings: There  is an airspace opacity with air bronchograms in the anterior right lung base, which were not present on the CT of 12/16/2011.  There is bronchiectasis in both lower lobes.  Patchy opacities in the dependent portions of both lower lobes may reflect atelectasis and/or consolidation.  There are blebs in the lingula and medial segment the right middle lobe.  Cardiomegaly is stable. There is a stable small pericardial effusion.  No definite pleural effusion on today's study.  There is marked right renal atrophy.  This is stable compared to CT of 12/16/2011.  There is no hydronephrosis of the atrophic right kidney.  No evidence of mass or stone.  The left kidney is normal in size.  There is no hydronephrosis, stone, or evidence of mass on the left.  There are scattered calcifications in the renal arteries bilaterally. The ureters are normal in caliber.  Aneurysmal dilatation of the proximal abdominal aorta is stable and measures 4.9 cm AP diameter by 4.7 cm transverse diameter.  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  A single partially calcified gallstone is seen in the gallbladder fundus.  Gallbladder wall appears normal by CT.  The noncontrast appearance of the liver, spleen, adrenal glands, and pancreas is within normal limits.  Stomach is moderately distended.  The duodenum jejunal and proximal ileal loops are dilated, and several contain air contrast levels. Jejunum  measures up to 4.2 cm in caliber.  Overall, there is no significant change in bowel dilatation compared to prior study 12/16/2011.  Distal ileal loops are decompressed.  The transition from dilated to decompressed small bowel loops is again noted in the upper pelvis, near the level of the aortoiliac bifurcation.  There is no evidence of bowel wall thickening, pneumatosis, or free intraperitoneal air.  There is a prominent amount of stool in the colon.  There are  scattered colonic diverticula from the cecum to the distal sigmoid colon.  Urinary bladder is not very distended.  Prostate gland within normal limits.  The right inguinal hernia is present and contains fat only.  No acute or suspicious bony abnormality.  IMPRESSION:  1.  Persistent small bowel obstruction pattern.  Overall, no significant change in bowel dilatation is appreciated compared to the CT of 12/16/2011.  Transition from dilated small bowel is not seen in the upper anatomic pelvis.  Question adhesions in this patient status post repair of distal abdominal aortic aneurysm with aorto by iliac graft. 2.  Stable proximal abdominal aortic aneurysm. 3.  Significant right renal atrophy.  Negative for urinary tract obstruction. 4.  Interval development of anterior right basilar airspace disease suspicious for pneumonia or aspiration.  Opacities in both lower lobes could reflect atelectasis and/or airspace disease. 5.  Colonic diverticulosis, uncomplicated. 6.  Right inguinal hernia contains fat. 7.  Cholelithiasis.  Original Report Authenticated By: Britta Mccreedy, M.D.   Dg Abd 2 Views  12/24/2011  *RADIOLOGY REPORT*  Clinical Data: Abdominal pain.  Follow-up abdominal aortic surgery.  ABDOMEN - 2 VIEW  Comparison: 12/22/2011 CT.  Findings:  Abnormal bowel gas pattern with gas and fluid filled small bowel loops.  Dimension of the small bowel difficult to adequately assess given the degree of fluid filled contents.  Contrast has traversed into the right colon suggesting that there is not a complete small bowel obstruction.  No free intraperitoneal air.  Basilar subsegmental atelectatic changes.  Impression: Nonspecific although abnormal bowel gas pattern as noted above.  Original Report Authenticated By: Fuller Canada, M.D.    Anti-infectives: Anti-infectives     Start     Dose/Rate Route Frequency Ordered Stop   12/21/11 1600   Levofloxacin (LEVAQUIN) IVPB 750 mg        750 mg 100 mL/hr over 90  Minutes Intravenous Every 24 hours 12/21/11 1450            Assessment Principal Problem:  *DKA (diabetic ketoacidoses) Active Problems:    Partial SBO (small bowel obstruction)-most likely due to adhesions-contrast in right colon from 1/19 CT.  Asx now.    LOS: 14 days   Plan: Mobilize.  Continue bowel rest today.  Check x-rays tomorrow.   [name] 12/24/2011

## 2011-12-25 ENCOUNTER — Inpatient Hospital Stay (HOSPITAL_COMMUNITY): Payer: Medicare Other

## 2011-12-25 LAB — GLUCOSE, CAPILLARY
Glucose-Capillary: 109 mg/dL — ABNORMAL HIGH (ref 70–99)
Glucose-Capillary: 137 mg/dL — ABNORMAL HIGH (ref 70–99)
Glucose-Capillary: 183 mg/dL — ABNORMAL HIGH (ref 70–99)
Glucose-Capillary: 216 mg/dL — ABNORMAL HIGH (ref 70–99)
Glucose-Capillary: 209 mg/dL — ABNORMAL HIGH (ref 70–99)

## 2011-12-25 LAB — RENAL FUNCTION PANEL
Albumin: 2.6 g/dL — ABNORMAL LOW (ref 3.5–5.2)
CO2: 17 mEq/L — ABNORMAL LOW (ref 19–32)
Calcium: 9 mg/dL (ref 8.4–10.5)
Chloride: 107 mEq/L (ref 96–112)
GFR calc Af Amer: 28 mL/min — ABNORMAL LOW (ref >90)
GFR calc non Af Amer: 24 mL/min — ABNORMAL LOW (ref >90)
Sodium: 138 mEq/L (ref 135–145)

## 2011-12-25 LAB — CARDIAC PANEL(CRET KIN+CKTOT+MB+TROPI)
CK, MB: 4.1 ng/mL — ABNORMAL HIGH (ref 0.3–4.0)
Relative Index: INVALID (ref 0.0–2.5)
Total CK: 53 U/L (ref 7–232)
Troponin I: <0.3 ng/mL (ref <0.30)

## 2011-12-25 LAB — CBC
Hemoglobin: 11.1 g/dL — ABNORMAL LOW (ref 13.0–17.0)
MCHC: 35.1 g/dL (ref 30.0–36.0)
Platelets: 200 10*3/uL (ref 150–400)

## 2011-12-25 MED ORDER — LABETALOL HCL 100 MG PO TABS: 100.0000 mg | ORAL_TABLET | Freq: Two times a day (BID) | ORAL | Status: DC

## 2011-12-25 MED ORDER — FAMOTIDINE IN NACL 20-0.9 MG/50ML-% IV SOLN
20.0000 mg | Freq: Two times a day (BID) | INTRAVENOUS | Status: DC
  Administered 2011-12-25 – 2011-12-26 (×2): 20 mg via INTRAVENOUS
  Filled 2011-12-25 (×4): qty 50

## 2011-12-25 MED ORDER — BOOST / RESOURCE BREEZE PO LIQD
1.0000 | Freq: Two times a day (BID) | ORAL | Status: DC
  Administered 2011-12-26 (×2): 1 via ORAL

## 2011-12-25 MED ORDER — LABETALOL HCL 100 MG PO TABS
100.0000 mg | ORAL_TABLET | Freq: Two times a day (BID) | ORAL | Status: DC
  Administered 2011-12-25 – 2011-12-29 (×9): 100 mg via ORAL
  Filled 2011-12-25 (×10): qty 1

## 2011-12-25 MED ORDER — FLUCONAZOLE 100MG IVPB
100.0000 mg | Freq: Once | INTRAVENOUS | Status: AC
  Administered 2011-12-25: 100 mg via INTRAVENOUS
  Filled 2011-12-25: qty 50

## 2011-12-25 NOTE — Progress Notes (Addendum)
Subjective: No complaints. Specifically no nausea or abdominal pain today. He is passing gas.   Objective:  Vital signs in last 24 hours:  Filed Vitals:   12/25/11 0900 12/25/11 1000 12/25/11 1020 12/25/11 1237  BP: 146/80 183/101 170/98 165/96  Pulse: 94 75 94 87  Temp:    98.2 F (36.8 C)  TempSrc:    Oral  Resp: 21 16  17   Height:      Weight:      SpO2: 98% 83%  100%    Intake/Output from previous day:   Intake/Output Summary (Last 24 hours) at 12/25/11 1357 Last data filed at 12/25/11 1000  Gross per 24 hour  Intake   1150 ml  Output   3750 ml  Net  -2600 ml    Physical Exam:  General:  Laying in bed in no acute distress. HEENT: no pallor, no icterus,  no JVD, no lymphadenopathy Heart: Normal  s1 &s2  Regular rate and rhythm, without murmurs, rubs, gallops. Lungs: Clear to auscultation bilaterally. Abdomen: Soft, nontender, non-distended, decreased bowel sounds Extremities: No clubbing cyanosis or edema with positive pedal pulses. Psych: Alert, awake, oriented x3, nonfocal.   Lab Results:  Basic Metabolic Panel:    Component Value Date/Time   NA 138 12/25/2011 0430   K 4.4 12/25/2011 0430   CL 107 12/25/2011 0430   CO2 17* 12/25/2011 0430   BUN 37* 12/25/2011 0430   CREATININE 2.69* 12/25/2011 0430   GLUCOSE 120* 12/25/2011 0430   CALCIUM 9.0 12/25/2011 0430   CBC:    Component Value Date/Time   WBC 11.5* 12/25/2011 0430   HGB 11.1* 12/25/2011 0430   HCT 31.6* 12/25/2011 0430   PLT 200 12/25/2011 0430   MCV 84.7 12/25/2011 0430   NEUTROABS 7.6 12/12/2011 0530   LYMPHSABS 4.3* 12/12/2011 0530   MONOABS 0.8 12/12/2011 0530   EOSABS 0.4 12/12/2011 0530   BASOSABS 0.0 12/12/2011 0530     Medications: Scheduled Meds:    . insulin aspart  0-9 Units Subcutaneous Q4H  . labetalol  100 mg Oral BID  . labetalol  100 mg Oral Once  . levofloxacin (LEVAQUIN) IV  250 mg Intravenous Q24H  . nicotine  21 mg Transdermal Daily  . nitroGLYCERIN      . nystatin  10 mL Oral  QID  . vitamin B-12  1,000 mcg Oral Daily  . DISCONTD: amLODipine  10 mg Oral Daily  . DISCONTD: hydrALAZINE  10 mg Intravenous Q8H   Continuous Infusions:    . sodium chloride 50 mL/hr at 12/25/11 0454   PRN Meds:.acetaminophen, cloNIDine, dextrose, dextrose, hydrALAZINE, HYDROcodone-acetaminophen, morphine injection, ondansetron (ZOFRAN) IV   Assessment  61 y/o Male with PMHx of DM, HTN, CVA with residual weakness, abdominal aneurysm s/p repair was admitted on 1/8 with DKA. On 1/10 patient c/o abdominal pain with nausea/vomiting , KUB showed SBO. [place] sx was consulted and recommended conservative management. CT of the abdomen / pelvis demonstrated mid small bowel obstruction secondary to likely an adhesion in the upper central pelvis. At the same time, his BP became difficult to control as he was NPO and required IV Labetalol gtt with tx to ICU for management. Patient  improved and transferred to medial floor but then developed hypotension, hematuria and oliguric renal failure.  Plan:   AKI on chronic kidney disease  CT abd and pelvis reveals atrophic rt kidney which is likely from medical-renal disease Foley inserted by Dr Laverle Patter. Urine output has improved.  Adjusted antihypertensives  with resultant improvement in BP and renal function.   Hematuria Possibly related to traumatic catheterization. Had copious amount  Blood clot with foley removala nd also frsh bleeding after that which has now stopped. H&h wnl. Type and screen ordered  will transfer patient to stepdown  Malignant HTN  BP dropped to 90s systolic. D/c'd cardizem and labetalol  Discontinued PRN CLonidine  Will now resume with instructions to hold for SBP < 140.   Partial SBO  Keep pt NPO except meds. cont zofran  Surgery following  Hypoxia with low grade fever  noted to desaturate on RA on 1/18  stable on 2-3 L  CXR shows basilar opacity possible for PNA but he is asymptomatic. started on IV levaquin since  1/18 Will d/c antibiotics.   DM w/ DKA on presentation  CBGs are now well controlled  Q4h sliding scale while NPO. Cont lantus.   ABDOMINAL AORTIC ANEURYSM w/ hx of repair  Stable   Mild Hyponatremia- resolved. Continuing saline.   Thrush- nystatin has been fiven for 2 days but no improvement has been seen. I will give him a dose of Fluconazole and we will re-evaluate tomorrow. He is not having any trouble with swallowing and therefore I doubt he has candida esophagitis.   Nicotine abuse  DVT prophylaxis: SCD     LOS: 15 days   [name] 12/25/2011, 1:57 PM

## 2011-12-25 NOTE — Progress Notes (Signed)
Renal Daily Progress Note  Subjective: Interval History: Urine output 4400. Net negative 2.9 L. Episode chest pain overnight, now resolved. Creatinine continues to improve. Down to 2.7  Objective:  Vital signs in last 24 hours:  Temp:  [97.9 F (36.6 C)-99.1 F (37.3 C)] 99.1 F (37.3 C) (01/22 0432) Pulse Rate:  [79-101] 84  (01/22 0600) Resp:  [18-27] 27  (01/22 0600) BP: (125-204)/(54-114) 143/76 mmHg (01/22 0600) SpO2:  [93 %-98 %] 96 % (01/22 0600) Weight:  [147 lb 11.3 oz (67 kg)] 147 lb 11.3 oz (67 kg) (01/22 0017) Weight change:   Intake/Output: I/O last 3 completed shifts: In: 2933.8 [I.V.:2933.8] Out: 5901 [Urine:5900; Emesis/NG output:1]  Intake/Output this shift:     EXAM: Gen: alert, lying flat in bed. Answers questions appropriately. Chest: CTA anteriorally, no resp distress Heart: II/VI systolic murmur at LLSB  Abdomen: soft, nontender, large scar from AAA repair nondistended, no masses  Ext: no edema LE's, no ischemic changes or gangrene  Neuro: No asterixis, nonfocal motor exam, a/ox 3   Lab Results:  Basename 12/25/11 0430 12/22/11 1609  WBC 11.5* 13.1*  HGB 11.1* 13.5  HCT 31.6* 39.1  PLT 200 287   BMET  Basename 12/25/11 0430 12/24/11 0500 12/23/11 0452  NA 138 137 133*  K 4.4 4.2 5.1  CL 107 106 95*  CO2 17* 18* 20  GLUCOSE 120* 73 148*  BUN 37* 53* 69*  CREATININE 2.69* 3.86* 6.56*  CALCIUM 9.0 8.2* 7.9*  PHOS 3.0 3.7 5.7*   LFT  Basename 12/25/11 0430  PROT --  ALBUMIN 2.6*  AST --  ALT --  ALKPHOS --  BILITOT --  BILIDIR --  IBILI --   PT/INR No results found for this basename: LABPROT:2,INR:2 in the last 72 hours Hepatitis Panel No results found for this basename: HEPBSAG,HCVAB,HEPAIGM,HEPBIGM in the last 72 hours PTH: Lab Results  Component Value Date   CALCIUM 9.0 12/25/2011   CAION 1.21 12/10/2011   PHOS 3.0 12/25/2011   Lab Results  Component Value Date   CKTOTAL 53 12/25/2011   CKMB 4.1* 12/25/2011   TROPONINI  <0.30 12/25/2011   UA: 1.015, tr hgb, 100 prot, 0-2 RBC, hyaline casts.  Studies/Results: 12/22/11: CT abd w/o contrast: 1. Persistent small bowel obstruction pattern. Overall, no  significant change in bowel dilatation is appreciated compared to  the CT of 12/16/2011. Transition from dilated small bowel is not  seen in the upper anatomic pelvis. Question adhesions in this  patient status post repair of distal abdominal aortic aneurysm with  aorto by iliac graft.  2. Stable proximal abdominal aortic aneurysm.  3. Significant right renal atrophy. Negative for urinary tract  obstruction.  4. Interval development of anterior right basilar airspace disease  suspicious for pneumonia or aspiration. Opacities in both lower  lobes could reflect atelectasis and/or airspace disease.  5. Colonic diverticulosis, uncomplicated.  6. Right inguinal hernia contains fat.  7. Cholelithiasis.  1/20 MRI/MRA. Left renal artery origin is visualized on one sequence and appears patent. However, the entire left renal artery is not visualized in detail. Celiac and SMA origins are also patent.  Residual 5.1 cm suprarenal abdominal aortic aneurysm. Status post infrarenal aorto bi iliac bypass graft  Chronic right renal atrophy; Cholelithiasis; Small bowel obstruction little interval change    Assessment/Plan: 1. Acute on CKD, likely secondary to rapid decline in blood pressure in setting of chronic hypertension. This has improved with stopping antihypertensives. Nice control with single agent labetolol to near  goal, but now elevated again after chest pain. May increase dose as heart rate allows. MRA does not show new arterial pathology. IVF at 50cc/hr in setting of poor oral intake as well as large amount of UOP (post injury diuresis), would continue.  Possibly a component of bladder outlet obstruction in setting of difficulty in passing Foley. Urology following for possible need of BPH treatment. 2. CKD, baseline creat  1.3-1.7 , approaching baseline 3. HTN-Now returning near baseline. May need restart of single agent later tonight or in am. 4. PSBO-per primary team and surgery.  5. Hx AAA repair  6. HL  7. DM 2-per primary team. 8. CP-Per primary team.  Renal will sign off.  Continue IVF 50/hour as is putting out large amounts of urine and not taking in much PO.  Decrease as able.  I would titrate labetalol to keep BP around 13-150 SBP.  Can add norvasc if needed as well  When close to d/c , would take foley out a few days before d/c to make sure that voiding is not a problem, if is, call GU    LOS: 15 KONKOL,JILL PGY-2 12/25/2011 7:20 AM  Patient seen and examined, agree with above note with above modifications.  Annie Sable, MD 12/25/2011

## 2011-12-25 NOTE — Progress Notes (Signed)
INITIAL ADULT NUTRITION ASSESSMENT Date: 12/25/2011   Time: 2:33 PM  Reason for Assessment: Nutrition Risk  ASSESSMENT: Male 61 y.o.  Dx: DKA (diabetic ketoacidoses)  Hx:  Past Medical History  Diagnosis Date  . Diabetes mellitus   . Hypertension   . Aneurysm of aorta   . Aneurysm of abdominal aorta   . Stroke     CVA effected  eye  & leg  . Cataract, bilateral   . Renal disorder     Related Meds:     . fluconazole (DIFLUCAN) IV  100 mg Intravenous Once  . insulin aspart  0-9 Units Subcutaneous Q4H  . labetalol  100 mg Oral Once  . labetalol  100 mg Oral BID  . levofloxacin (LEVAQUIN) IV  250 mg Intravenous Q24H  . nicotine  21 mg Transdermal Daily  . nitroGLYCERIN      . nystatin  10 mL Oral QID  . vitamin B-12  1,000 mcg Oral Daily  . DISCONTD: amLODipine  10 mg Oral Daily  . DISCONTD: hydrALAZINE  10 mg Intravenous Q8H  . DISCONTD: labetalol  100 mg Oral BID  . DISCONTD: labetalol  100 mg Oral BID     Ht: 6\' 3"  (190.5 cm)  Wt: 147 lb 11.3 oz (67 kg) 12/25/11; 159 lb/72.4 kg (12/22/11); admit wt 175 lb/79.8 kg (12/11/11)--trending down since admission  Ideal Wt: 89 kg  % Ideal Wt: 75.2%  Usual Wt: 78 kg % Usual Wt: 85.9%  Body mass index is 18.46 kg/(m^2).  Consistent with underweight.  Food/Nutrition Related Hx:  Pt. weight has decreased 28 lbs over two week period. He reported following the diabetic diet at home, and that his sister prepares majority of his meals.   Labs:  CMP     Component Value Date/Time   NA 138 12/25/2011 0430   K 4.4 12/25/2011 0430   CL 107 12/25/2011 0430   CO2 17* 12/25/2011 0430   GLUCOSE 120* 12/25/2011 0430   BUN 37* 12/25/2011 0430   CREATININE 2.69* 12/25/2011 0430   CALCIUM 9.0 12/25/2011 0430   PROT 8.3 12/10/2011 2339   ALBUMIN 2.6* 12/25/2011 0430   AST 14 12/10/2011 2339   ALT 11 12/10/2011 2339   ALKPHOS 102 12/10/2011 2339   BILITOT 0.2* 12/10/2011 2339   GFRNONAA 24* 12/25/2011 0430   GFRAA 28* 12/25/2011 0430   CBG (last  3)   Basename 12/25/11 1230 12/25/11 0838 12/25/11 0016  GLUCAP 183* 137* 109*    Lab Results  Component Value Date   HGBA1C 10.8* 12/11/2011   HGBA1C 7.0* 01/17/2010   HGBA1C  Value: 10.3 (NOTE)   The ADA recommends the following therapeutic goal for glycemic   control related to Hgb A1C measurement:   Goal of Therapy:   < 7.0% Hgb A1C   Reference: American Diabetes Association: Clinical Practice   Recommendations 2008, Diabetes Care,  2008, 31:(Suppl 1).* 02/14/2009    Intake: I/O last 3 completed shifts: In: 2983.8 [I.V.:2983.8] Out: 5901 [Urine:5900; Emesis/NG output:1] Total I/O In: 150 [I.V.:150] Out: -   Diet Order: Clear Liquid since 1/22, NPO 1/19-1/22, Carb Mod Med 1/16-18  Supplements/Tube Feeding: [name]  IVF:     sodium chloride Last Rate: 50 mL/hr at 12/25/11 0454    Estimated Nutritional Needs:   Kcal:1725-1925 Protein:86-96 gm Fluid: >1.7 L  Pt reported a good appetite and consumed most of clear liquid meals. He has had little nausea/vomiting. He agreed to consume supplement along with meals.   Pt weight  loss of 15.7% of usual weight over two week period and consuming <50% of estimated needs meets criteria for severe malnutrition in the context of acute illness.   NUTRITION DIAGNOSIS: -Inadequate oral intake (NI-2.1).  Status: Ongoing  RELATED TO: initial dysphagia and nausea/vomiting  AS EVIDENCE BY: 28 lb weight loss over two week period and clear liquid diet status   MONITORING/EVALUATION(Goals): Goal: Consume >90% of estimated needs Monitor: Diet advancement, weight, po intake, labs, education needs  EDUCATION NEEDS: -Education not appropriate at this time  INTERVENTION:  Supplement Nurse, adult twice daily to comply with clear liquid diet  Recommend Chocolate Ensure Complete twice daily once diet order has been advanced  Dietitian #:[phone]  DOCUMENTATION CODES Per approved criteria  -Severe malnutrition in the context of acute illness  or injury    [employer], Sheliah Hatch 12/25/2011, 2:33 PM

## 2011-12-25 NOTE — Progress Notes (Signed)
Subjective: No abdominal pain, nausea, or vomiting.  Had a BM.  Passing gas.  Objective: Vital signs in last 24 hours: Temp:  [97.9 F (36.6 C)-99.1 F (37.3 C)] 99.1 F (37.3 C) (01/22 0432) Pulse Rate:  [81-101] 84  (01/22 0600) Resp:  [18-27] 27  (01/22 0600) BP: (125-204)/(54-114) 143/76 mmHg (01/22 0600) SpO2:  [93 %-98 %] 96 % (01/22 0600) Weight:  [147 lb 11.3 oz (67 kg)] 147 lb 11.3 oz (67 kg) (01/22 0017) Last BM Date: 12/24/11  Intake/Output from previous day: 01/21 0701 - 01/22 0700 In: 1433.8 [I.V.:1433.8] Out: 4400 [Urine:4400] Intake/Output this shift:    PE: Abd-soft, nontender, nondistended, active BS  Lab Results:   Basename 12/25/11 0430 12/22/11 1609  WBC 11.5* 13.1*  HGB 11.1* 13.5  HCT 31.6* 39.1  PLT 200 287   BMET  Basename 12/25/11 0430 12/24/11 0500  NA 138 137  K 4.4 4.2  CL 107 106  CO2 17* 18*  GLUCOSE 120* 73  BUN 37* 53*  CREATININE 2.69* 3.86*  CALCIUM 9.0 8.2*   PT/INR No results found for this basename: LABPROT:2,INR:2 in the last 72 hours Comprehensive Metabolic Panel:    Component Value Date/Time   NA 138 12/25/2011 0430   K 4.4 12/25/2011 0430   CL 107 12/25/2011 0430   CO2 17* 12/25/2011 0430   BUN 37* 12/25/2011 0430   CREATININE 2.69* 12/25/2011 0430   GLUCOSE 120* 12/25/2011 0430   CALCIUM 9.0 12/25/2011 0430   AST 14 12/10/2011 2339   ALT 11 12/10/2011 2339   ALKPHOS 102 12/10/2011 2339   BILITOT 0.2* 12/10/2011 2339   PROT 8.3 12/10/2011 2339   ALBUMIN 2.6* 12/25/2011 0430     Studies/Results: Mr Maxine Glenn Abdomen Wo Contrast  12/24/2011  *RADIOLOGY REPORT*  Clinical Data:  Elevated creatinine, oliguric renal failure, history of abdominal aortic aneurysm repair  MRA ABDOMEN WITHOUT CONTRAST  Technique:  Angiographic images of the abdomen were obtained using MRA technique without intravenous contrast.  Comparison:  12/22/2011, 02/03/2006  Findings:  Exam is limited because of respiratory motion artifact, lack of contrast  because of elevated creatinine, and turbulent aortic flow because of the aneurysm.  Abdominal aorta demonstrates a residual suprarenal fusiform aneurysm measuring 5.1 x 5.1 cm at the SMA origin, series 9 image 13.  Diffuse atherosclerotic changes with mural thrombus of the abdominal aorta.  The patient is status post aorto iliac bypass graft.  The celiac, SMA, and left renal artery origins are visualized and appear patent when comparing with the 2 prior studies.  No definite significant ostial left renal artery stenosis demonstrated within the limits of the study.  The more peripheral left renal artery is not visualized in detail.  Right renal origin also appears patent. Chronic right renal atrophy and noted.  Right kidney measures 7.4 cm.  Left kidney measures 11.4 cm.  Additional axial imaging demonstrates a single gallstone layering in the gallbladder dependently, image 11 series 9.  Diffuse small bowel dilatation of the jejunal loops compatible with ongoing small bowel obstruction, little change.  IMPRESSION: Limited exam, multifactorial as described.  Left renal artery origin is visualized on one sequence and appears patent.  However, the entire left renal artery is not visualized in detail.  Celiac and SMA origins are also patent.  Residual 5.1 cm suprarenal abdominal aortic aneurysm.  Status post infrarenal aorto bi iliac bypass graft  Chronic right renal atrophy  Cholelithiasis  Small bowel obstruction little interval change  Original Report Authenticated By:  M. Ruel Favors, M.D.   Dg Abd 2 Views  12/25/2011  *RADIOLOGY REPORT*  Clinical Data: Partial small bowel obstruction.  ABDOMEN - 2 VIEW  Comparison: 12/24/2011  Findings: Upright and supine images of the abdomen again demonstrate oral contrast in the right colon.  The contrast has not significantly moved.  There appears to be decreased air fluid levels within the small bowel loops.  Overall, there is minimal gas within the small bowel.  There is  gas and stool in the left colon. Mild blunting at the left costophrenic angle could represent a tiny effusion or atelectasis. No evidence for free air.  IMPRESSION:  Decreased bowel gas within the small bowel loops.  However, the oral contrast has not moved and remains in the right colon.  Original Report Authenticated By: Richarda Overlie, M.D.   Dg Abd 2 Views  12/24/2011  *RADIOLOGY REPORT*  Clinical Data: Abdominal pain.  Follow-up abdominal aortic surgery.  ABDOMEN - 2 VIEW  Comparison: 12/22/2011 CT.  Findings:  Abnormal bowel gas pattern with gas and fluid filled small bowel loops.  Dimension of the small bowel difficult to adequately assess given the degree of fluid filled contents.  Contrast has traversed into the right colon suggesting that there is not a complete small bowel obstruction.  No free intraperitoneal air.  Basilar subsegmental atelectatic changes.  Impression: Nonspecific although abnormal bowel gas pattern as noted above.  Original Report Authenticated By: Fuller Canada, M.D.    Anti-infectives: Anti-infectives     Start     Dose/Rate Route Frequency Ordered Stop   12/24/11 1600   Levofloxacin (LEVAQUIN) IVPB 250 mg        250 mg 50 mL/hr over 60 Minutes Intravenous Every 24 hours 12/24/11 1118     12/21/11 1600   Levofloxacin (LEVAQUIN) IVPB 750 mg  Status:  Discontinued        750 mg 100 mL/hr over 90 Minutes Intravenous Every 24 hours 12/21/11 1450 12/24/11 1117          Assessment Principal Problem:  *DKA (diabetic ketoacidoses) Active Problems:  HYPERTENSION  ABDOMINAL AORTIC ANEURYSM REPAIR, HX OF  Hyperlipidemia  Partial SBO (small bowel obstruction)-most likely due to adhesions-clinically and radiographically improving once again.    LOS: 15 days   Plan: Start sips of clear liquids.   Vertis Bauder J 12/25/2011

## 2011-12-26 LAB — CBC
Hemoglobin: 10.8 g/dL — ABNORMAL LOW (ref 13.0–17.0)
MCHC: 34.7 g/dL (ref 30.0–36.0)
WBC: 10.5 10*3/uL (ref 4.0–10.5)

## 2011-12-26 LAB — RENAL FUNCTION PANEL
Albumin: 2.3 g/dL — ABNORMAL LOW (ref 3.5–5.2)
BUN: 24 mg/dL — ABNORMAL HIGH (ref 6–23)
CO2: 19 mEq/L (ref 19–32)
Chloride: 104 mEq/L (ref 96–112)
Glucose, Bld: 151 mg/dL — ABNORMAL HIGH (ref 70–99)
Potassium: 3.9 mEq/L (ref 3.5–5.1)

## 2011-12-26 LAB — GLUCOSE, CAPILLARY
Glucose-Capillary: 158 mg/dL — ABNORMAL HIGH (ref 70–99)
Glucose-Capillary: 202 mg/dL — ABNORMAL HIGH (ref 70–99)

## 2011-12-26 MED ORDER — INSULIN ASPART 100 UNIT/ML ~~LOC~~ SOLN
0.0000 [IU] | Freq: Three times a day (TID) | SUBCUTANEOUS | Status: DC
  Administered 2011-12-26 – 2011-12-27 (×4): 3 [IU] via SUBCUTANEOUS
  Administered 2011-12-27 – 2011-12-28 (×2): 2 [IU] via SUBCUTANEOUS
  Administered 2011-12-28: 3 [IU] via SUBCUTANEOUS
  Administered 2011-12-28: 2 [IU] via SUBCUTANEOUS
  Administered 2011-12-29 (×2): 3 [IU] via SUBCUTANEOUS
  Administered 2011-12-29: 2 [IU] via SUBCUTANEOUS

## 2011-12-26 MED ORDER — INSULIN ASPART 100 UNIT/ML ~~LOC~~ SOLN
0.0000 [IU] | Freq: Every day | SUBCUTANEOUS | Status: DC
  Administered 2011-12-26: 2 [IU] via SUBCUTANEOUS

## 2011-12-26 MED ORDER — PANTOPRAZOLE SODIUM 40 MG PO TBEC
40.0000 mg | DELAYED_RELEASE_TABLET | Freq: Every day | ORAL | Status: DC
  Administered 2011-12-26 – 2011-12-29 (×4): 40 mg via ORAL
  Filled 2011-12-26 (×4): qty 1

## 2011-12-26 NOTE — Progress Notes (Signed)
Subjective: NO COMPLAINTS.   Objective: Weight change:   Intake/Output Summary (Last 24 hours) at 12/26/11 1133 Last data filed at 12/26/11 0900  Gross per 24 hour  Intake   1953 ml  Output   2440 ml  Net   -487 ml    Filed Vitals:   12/26/11 0934  BP: 172/84  Pulse: 92  Temp: 99.2 F (37.3 C)  Resp: 18   General: Laying in bed in no acute distress.  HEENT: no pallor, no icterus, no JVD, no lymphadenopathy  Heart: Normal s1 &s2 Regular rate and rhythm, without murmurs, rubs, gallops.  Lungs: Clear to auscultation bilaterally.  Abdomen: Soft, nontender, non-distended, decreased bowel sounds  Extremities: No clubbing cyanosis or edema with positive pedal pulses.  Psych: Alert, awake, oriented x3, nonfocal.   Lab Results: Results for orders placed during the hospital encounter of 12/10/11 (from the past 24 hour(s))  CARDIAC PANEL(CRET KIN+CKTOT+MB+TROPI)     Status: Abnormal   Collection Time   12/25/11 11:55 AM      Component Value Range   Total CK 52  7 - 232 (U/L)   CK, MB 4.4 (*) 0.3 - 4.0 (ng/mL)   Troponin I <0.30  <0.30 (ng/mL)   Relative Index RELATIVE INDEX IS INVALID  0.0 - 2.5   GLUCOSE, CAPILLARY     Status: Abnormal   Collection Time   12/25/11 12:30 PM      Component Value Range   Glucose-Capillary 183 (*) 70 - 99 (mg/dL)   Comment 1 Notify RN    GLUCOSE, CAPILLARY     Status: Abnormal   Collection Time   12/25/11  4:56 PM      Component Value Range   Glucose-Capillary 216 (*) 70 - 99 (mg/dL)   Comment 1 Notify RN    GLUCOSE, CAPILLARY     Status: Abnormal   Collection Time   12/25/11  8:04 PM      Component Value Range   Glucose-Capillary 209 (*) 70 - 99 (mg/dL)   Comment 1 Documented in Chart     Comment 2 Notify RN    GLUCOSE, CAPILLARY     Status: Abnormal   Collection Time   12/26/11 12:13 AM      Component Value Range   Glucose-Capillary 150 (*) 70 - 99 (mg/dL)  GLUCOSE, CAPILLARY     Status: Abnormal   Collection Time   12/26/11  3:29 AM        Component Value Range   Glucose-Capillary 158 (*) 70 - 99 (mg/dL)  RENAL FUNCTION PANEL     Status: Abnormal   Collection Time   12/26/11  6:40 AM      Component Value Range   Sodium 135  135 - 145 (mEq/L)   Potassium 3.9  3.5 - 5.1 (mEq/L)   Chloride 104  96 - 112 (mEq/L)   CO2 19  19 - 32 (mEq/L)   Glucose, Bld 151 (*) 70 - 99 (mg/dL)   BUN 24 (*) 6 - 23 (mg/dL)   Creatinine, Ser 1.61 (*) 0.50 - 1.35 (mg/dL)   Calcium 8.2 (*) 8.4 - 10.5 (mg/dL)   Phosphorus 2.1 (*) 2.3 - 4.6 (mg/dL)   Albumin 2.3 (*) 3.5 - 5.2 (g/dL)   GFR calc non Af Amer 33 (*) >90 (mL/min)   GFR calc Af Amer 38 (*) >90 (mL/min)  CBC     Status: Abnormal   Collection Time   12/26/11  6:40 AM  Component Value Range   WBC 10.5  4.0 - 10.5 (K/uL)   RBC 3.71 (*) 4.22 - 5.81 (MIL/uL)   Hemoglobin 10.8 (*) 13.0 - 17.0 (g/dL)   HCT 16.1 (*) 09.6 - 52.0 (%)   MCV 83.8  78.0 - 100.0 (fL)   MCH 29.1  26.0 - 34.0 (pg)   MCHC 34.7  30.0 - 36.0 (g/dL)   RDW 04.5  40.9 - 81.1 (%)   Platelets 221  150 - 400 (K/uL)  GLUCOSE, CAPILLARY     Status: Abnormal   Collection Time   12/26/11  7:59 AM      Component Value Range   Glucose-Capillary 176 (*) 70 - 99 (mg/dL)  GLUCOSE, CAPILLARY     Status: Abnormal   Collection Time   12/26/11 11:29 AM      Component Value Range   Glucose-Capillary 233 (*) 70 - 99 (mg/dL)     Micro Results: Recent Results (from the past 240 hour(s))  MRSA PCR SCREENING     Status: Normal   Collection Time   12/17/11  2:13 PM      Component Value Range Status Comment   MRSA by PCR NEGATIVE  NEGATIVE  Final   MRSA PCR SCREENING     Status: Normal   Collection Time   12/24/11  7:55 PM      Component Value Range Status Comment   MRSA by PCR NEGATIVE  NEGATIVE  Final     Studies/Results: Ct Abdomen Pelvis Wo Contrast  12/22/2011  *RADIOLOGY REPORT*  Clinical Data: Evaluate for small bowel obstruction.  Vomiting. Poor urine output and worsening renal function.  Exclude urinary tract  obstruction.  CT ABDOMEN AND PELVIS WITHOUT CONTRAST  Technique:  Multidetector CT imaging of the abdomen and pelvis was performed following the standard protocol without intravenous contrast.  Comparison: CT abdomen pelvis without contrast 12/16/2011.  Findings: There is an airspace opacity with air bronchograms in the anterior right lung base, which were not present on the CT of 12/16/2011.  There is bronchiectasis in both lower lobes.  Patchy opacities in the dependent portions of both lower lobes may reflect atelectasis and/or consolidation.  There are blebs in the lingula and medial segment the right middle lobe.  Cardiomegaly is stable. There is a stable small pericardial effusion.  No definite pleural effusion on today's study.  There is marked right renal atrophy.  This is stable compared to CT of 12/16/2011.  There is no hydronephrosis of the atrophic right kidney.  No evidence of mass or stone.  The left kidney is normal in size.  There is no hydronephrosis, stone, or evidence of mass on the left.  There are scattered calcifications in the renal arteries bilaterally. The ureters are normal in caliber.  Aneurysmal dilatation of the proximal abdominal aorta is stable and measures 4.9 cm AP diameter by 4.7 cm transverse diameter.  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  A single partially calcified gallstone is seen in the gallbladder fundus.  Gallbladder wall appears normal by CT.  The noncontrast appearance of the liver, spleen, adrenal glands, and pancreas is within normal limits.  Stomach is moderately distended.  The duodenum jejunal and proximal ileal loops are dilated, and several contain air contrast levels. Jejunum measures up to 4.2 cm in caliber.  Overall, there is no significant change in bowel dilatation compared to prior study 12/16/2011.  Distal ileal loops are decompressed.  The transition from dilated to  decompressed small bowel loops is again noted in the upper pelvis, near the level of the aortoiliac bifurcation.  There is no evidence of bowel wall thickening, pneumatosis, or free intraperitoneal air.  There is a prominent amount of stool in the colon.  There are scattered colonic diverticula from the cecum to the distal sigmoid colon.  Urinary bladder is not very distended.  Prostate gland within normal limits.  The right inguinal hernia is present and contains fat only.  No acute or suspicious bony abnormality.  IMPRESSION:  1.  Persistent small bowel obstruction pattern.  Overall, no significant change in bowel dilatation is appreciated compared to the CT of 12/16/2011.  Transition from dilated small bowel is not seen in the upper anatomic pelvis.  Question adhesions in this patient status post repair of distal abdominal aortic aneurysm with aorto by iliac graft. 2.  Stable proximal abdominal aortic aneurysm. 3.  Significant right renal atrophy.  Negative for urinary tract obstruction. 4.  Interval development of anterior right basilar airspace disease suspicious for pneumonia or aspiration.  Opacities in both lower lobes could reflect atelectasis and/or airspace disease. 5.  Colonic diverticulosis, uncomplicated. 6.  Right inguinal hernia contains fat. 7.  Cholelithiasis.  Original Report Authenticated By: Britta Mccreedy, M.D.   Ct Abdomen Pelvis Wo Contrast  12/16/2011  *RADIOLOGY REPORT*  Clinical Data: Diabetic QS doses, history hypertension, abdominal aortic aneurysm post repair, small bowel dilatation, no bowel movement for 3 days, right groin discomfort  CT ABDOMEN AND PELVIS WITHOUT CONTRAST  Technique:  Multidetector CT imaging of the abdomen and pelvis was performed following the standard protocol without intravenous contrast. Sagittal and coronal MPR images reconstructed from axial data set.  Comparison: 02/03/2006  Findings: Bilateral lower lobe atelectasis. Bullae at lung bases. Pericardial  effusion and small left pleural effusion. Aneurysmal dilatation of the proximal abdominal aorta up to 4.9 x 4.8 cm, previously 4.1 x 3.9 cm. Interval resection of the large infrarenal abdominal aortic aneurysm and placement of an aortic by iliac graft.  Liver, spleen, atrophic pancreas, left kidney, and adrenal glands normal appearance. Small atrophic right kidney without mass or obstruction, with right renal atrophy new since previous exam. Dilatation of proximal small bowel loops with normal caliber distal small bowel loops compatible with mid small bowel obstruction. Transition from dilated to nondilated small bowel occurs slightly left of midline in the upper pelvis, left anterolateral to the aortic graft bifurcation, question adhesion. No definite bowel wall thickening.  Scattered colonic diverticulosis without evidence of diverticulitis. Appendix not visualized. Bladder wall thickened but bladder is decompressed question artifact. Scattered free intraperitoneal fluid. Right inguinal hernia containing fat. No additional mass, adenopathy, or free air. No acute osseous findings.  IMPRESSION: Mid small bowel obstruction secondary to likely an adhesion in the upper central pelvis as above. Prior repair of a large distal abdominal aortic aneurysm. Interval increase in aneurysmal dilatation of the proximal abdominal aorta up to 4.9 x 4.8 cm in size. Interval right renal atrophy. Bibasilar atelectasis. Small left pleural and pericardial effusions. Right inguinal hernia containing fat.  Diffuse colonic diverticulosis.  Original Report Authenticated By: Lollie Marrow, M.D.   Ct Head Wo Contrast  12/11/2011  *RADIOLOGY REPORT*  Clinical Data: Difficulty swallowing.  Hyperglycemia and hypertension with weakness for 12 days.  CT HEAD WITHOUT CONTRAST  Technique:  Contiguous axial images were obtained from the base of the skull through the vertex without contrast.  Comparison: 02/02/2008.  Findings: No mass  lesion, mass  effect, midline shift, hydrocephalus, hemorrhage.  No acute territorial cortical ischemia/infarct. Atrophy and chronic ischemic white matter disease is present.  Atrophy of the left cerebral peduncle is present associated with prior infarct.  Intracranial atherosclerosis.  The study is mildly degraded by motion artifact.  Slices were repeated. Calvarium appears intact.  Paranasal sinuses are within normal limits. Unchanged small bilateral basal ganglia lacunar infarcts.  IMPRESSION: No acute intracranial abnormality.  Atrophy and chronic ischemic white matter disease with bilateral basal ganglia lacunar infarcts.  Original Report Authenticated By: Andreas Newport, M.D.   Mr Brain Wo Contrast  12/12/2011  *RADIOLOGY REPORT*  Clinical Data: Weakness and difficulty swallowing.  Rule out CVA.  MRI HEAD WITHOUT CONTRAST  Technique:  Multiplanar, multiecho pulse sequences of the brain and surrounding structures were obtained according to standard protocol without intravenous contrast.  Comparison: CT 12/11/2011  Findings: Negative for acute infarct.  Extensive chronic microvascular ischemia throughout the periventricular and deep cerebral white matter bilaterally.  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 Negative for mass or fluid collection.  There is generalized atrophy with prominence of the ventricles and subarachnoid space.  Paranasal sinuses are clear.  IMPRESSION: Atrophy and extensive chronic ischemic change.  No acute abnormality.  Original Report Authenticated By: Camelia Phenes, M.D.   Mr Mra Abdomen Wo Contrast  12/24/2011  *RADIOLOGY REPORT*  Clinical Data:  Elevated creatinine, oliguric renal failure, history of abdominal aortic aneurysm repair  MRA ABDOMEN WITHOUT CONTRAST  Technique:  Angiographic images of the abdomen were obtained using MRA technique without intravenous contrast.  Comparison:  12/22/2011, 02/03/2006  Findings:  Exam is limited  because of respiratory motion artifact, lack of contrast because of elevated creatinine, and turbulent aortic flow because of the aneurysm.  Abdominal aorta demonstrates a residual suprarenal fusiform aneurysm measuring 5.1 x 5.1 cm at the SMA origin, series 9 image 13.  Diffuse atherosclerotic changes with mural thrombus of the abdominal aorta.  The patient is status post aorto iliac bypass graft.  The celiac, SMA, and left renal artery origins are visualized and appear patent when comparing with the 2 prior studies.  No definite significant ostial left renal artery stenosis demonstrated within the limits of the study.  The more peripheral left renal artery is not visualized in detail.  Right renal origin also appears patent. Chronic right renal atrophy and noted.  Right kidney measures 7.4 cm.  Left kidney measures 11.4 cm.  Additional axial imaging demonstrates a single gallstone layering in the gallbladder dependently, image 11 series 9.  Diffuse small bowel dilatation of the jejunal loops compatible with ongoing small bowel obstruction, little change.  IMPRESSION: Limited exam, multifactorial as described.  Left renal artery origin is visualized on one sequence and appears patent.  However, the entire left renal artery is not visualized in detail.  Celiac and SMA origins are also patent.  Residual 5.1 cm suprarenal abdominal aortic aneurysm.  Status post infrarenal aorto bi iliac bypass graft  Chronic right renal atrophy  Cholelithiasis  Small bowel obstruction little interval change  Original Report Authenticated By: Judie Petit. Ruel Favors, M.D.   US Renal  12/12/2011  *RADIOLOGY REPORT*  Clinical Data:  Renal insufficiency.  Hypertension.  Previous abdominal aortic aneurysm repair.  RENAL/URINARY TRACT ULTRASOUND COMPLETE  Comparison:  None.  Findings:  Right Kidney:  Small in size, measuring 7.3 cm in length.  Normal parenchymal echogenicity.  No evidence of renal mass or hydronephrosis.  Left Kidney:  Normal in  size and parenchymal echogenicity. Measures 10.4 cm in length.  No evidence of renal mass or hydronephrosis.  Bladder:  Appears normal for degree of bladder distention.  IMPRESSION:  1.  No evidence of hydronephrosis. 2.  Small right kidney measuring 7.3 cm in length.  No evidence of renal mass.  Original Report Authenticated By: Danae Orleans, M.D.   Dg Chest Port 1 View  12/21/2011  *RADIOLOGY REPORT*  Clinical Data: Shortness of breath, fever  PORTABLE CHEST - 1 VIEW  Comparison: Portable chest x-ray of 12/14/2011  Findings: There has been an increase in opacities at the lung bases consistent with either atelectasis or pneumonia.  There is cardiomegaly present.  No pleural effusion is seen.  No bony abnormality is noted.  IMPRESSION: Increase in basilar opacities consistent with atelectasis or pneumonia.  Stable cardiomegaly.  Original Report Authenticated By: Juline Patch, M.D.   Dg Chest Port 1 View  12/14/2011  *RADIOLOGY REPORT*  Clinical Data: Cough.  PORTABLE CHEST - 1 VIEW  Comparison: 01/07 08/22/2012.  Findings: Dilated tortuous aorta.  Degree of uncoiling appears slightly more prominent which may be related to decreased inspiration.  Increased markings lung bases some which appear chronic. Superimposed subtle infiltrate medial aspect of the lung bases would be difficult to exclude on this portable exam.  Cardiomegaly.  No pulmonary edema or gross pneumothorax.  IMPRESSION: Dilated tortuous aorta.  Increased markings lung bases some which appear chronic. Superimposed subtle infiltrate medial aspect of the lung bases would be difficult to exclude on this portable exam.  Cardiomegaly.  Original Report Authenticated By: Fuller Canada, M.D.   Dg Chest Port 1 View  12/11/2011  *RADIOLOGY REPORT*  Clinical Data: Short of breath  PORTABLE CHEST - 1 VIEW  Comparison: Chest radiograph 02/21/2011  Findings: Stable enlarged heart silhouette with ectatic aorta. There is chronic linear scarring at the lung  bases.  Emphysematous change in the upper lobes.  No pneumothorax.  IMPRESSION:  1.  No clear acute cardiopulmonary process.  2.   Chronic scarring at the lung bases. 3.  Emphysematous change.  Original Report Authenticated By: Genevive Bi, M.D.   Dg Abd 2 Views  12/25/2011  *RADIOLOGY REPORT*  Clinical Data: Partial small bowel obstruction.  ABDOMEN - 2 VIEW  Comparison: 12/24/2011  Findings: Upright and supine images of the abdomen again demonstrate oral contrast in the right colon.  The contrast has not significantly moved.  There appears to be decreased air fluid levels within the small bowel loops.  Overall, there is minimal gas within the small bowel.  There is gas and stool in the left colon. Mild blunting at the left costophrenic angle could represent a tiny effusion or atelectasis. No evidence for free air.  IMPRESSION:  Decreased bowel gas within the small bowel loops.  However, the oral contrast has not moved and remains in the right colon.  Original Report Authenticated By: Richarda Overlie, M.D.   Dg Abd 2 Views  12/24/2011  *RADIOLOGY REPORT*  Clinical Data: Abdominal pain.  Follow-up abdominal aortic surgery.  ABDOMEN - 2 VIEW  Comparison: 12/22/2011 CT.  Findings:  Abnormal bowel gas pattern with gas and fluid filled small bowel loops.  Dimension of the small bowel difficult to adequately assess given the degree of fluid filled contents.  Contrast has traversed into the right colon suggesting that there is not a complete small bowel obstruction.  No free intraperitoneal air.  Basilar subsegmental atelectatic changes.  Impression: Nonspecific although abnormal bowel gas pattern as noted above.  Original Report Authenticated By: Fuller Canada, M.D.   Dg Abd 2 Views  12/16/2011  *RADIOLOGY REPORT*  Clinical Data: Partial small bowel obstruction follow-up.  ABDOMEN - 2 VIEW  Comparison: CT of earlier in the morning at 0134 hours.  Findings: 1146 hours.  Nasogastric terminates at the  gastroesophageal junction, with the side port above the GE junction.  The upright view demonstrates numerous small bowel air fluid levels, without convincing evidence of free intraperitoneal air.  Small bowel dilatation persists.  Example loop measures 4.5 cm versus 3.2 cm on the the CT of earlier today.  Remains gas within the colon.  No pneumatosis.  Distal gas identified.  Phleboliths in the pelvis.  IMPRESSION:  1.  Persistent likely minimally progressive partial small bowel obstruction. 2.  No free intraperitoneal air or other acute complication. 3.  Nasogastric tube has been retracted to the GE junction. Consider advancement.  This study was made a "call report."  Original Report Authenticated By: Consuello Bossier, M.D.   Dg Abd 2 Views  12/15/2011  *RADIOLOGY REPORT*  Clinical Data: Small bowel obstruction.  ABDOMEN - 2 VIEW  Comparison: Plain films of the abdomen and 12/13/2011 and 12/14/2011.  Findings: Short air fluid levels are again seen and small bowel. Dilated loops of small bowel are again identified measuring up to 5.6 cm.  There has been some increase in dilatation of small bowel loops in the left upper quadrant of the abdomen. No free intraperitoneal air is identified.  IMPRESSION: Mild worsening of small bowel obstruction.  Original Report Authenticated By: Bernadene Bell. D'ALESSIO, M.D.   Dg Abd 2 Views  12/14/2011  *RADIOLOGY REPORT*  Clinical Data: Follow up partial small bowel obstruction.  ABDOMEN - 2 VIEW  Comparison: 12/13/2011  Findings: Two views of the abdomen were obtained.  There is gas in the large and small bowel.  Persistent air fluid levels in the small bowel.  The degree of small bowel distention has not significantly changed.  Stool in the right colon.  No evidence to suggest free air. There may be small pleural effusions.  No acute bony abnormalities.  IMPRESSION: Stable appearance of the dilated small bowel loops.  The findings remain compatible with a partial small bowel  obstruction and minimal change since the prior examination.  Suspect small pleural effusions.  Original Report Authenticated By: Richarda Overlie, M.D.   Dg Abd 2 Views  12/13/2011  *RADIOLOGY REPORT*  Clinical Data: Mid abdominal pain with nausea and vomiting  ABDOMEN - 2 VIEW  Comparison: None.  Findings: There are several mildly dilated loops of small bowel within the left upper quadrant with differential air-fluid levels. The colon contains stool and gas and is not dilated.  No pneumoperitoneum.  No gross organomegaly.  Surgical clips project over the mid lower abdomen.  IMPRESSION: There are several dilated small bowel loops with differential air- fluid levels in the left upper quadrant, worrisome for a partial mechanical small-bowel obstruction.  Please continue to follow closely.  No pneumoperitoneum.  Original Report Authenticated By: Brandon Melnick, M.D.   Dg Abd Portable 1v  12/21/2011  *RADIOLOGY REPORT*  Clinical Data: Abdominal distention, small bowel obstruction  PORTABLE ABDOMEN - 1 VIEW  Comparison: Abdomen films of 12/17/2011 and CT abdomen pelvis of 12/16/2011  Findings: Compared to the scout film from the CT as well as plain films from 01/14, there is somewhat less gaseous distention of the small  bowel.  No colonic bowel gas is seen.  IMPRESSION: Improvement in gaseous distention of small bowel.  Original Report Authenticated By: Juline Patch, M.D.   Dg Abd Portable 2v  12/17/2011  *RADIOLOGY REPORT*  Clinical Data: Abdominal pain, possible small bowel obstruction.  PORTABLE ABDOMEN - 2 VIEW  Comparison: 12/17/2011  Findings: NG tube is in stable position within the descending duodenum.  No evidence of bowel obstruction.  Gas within nondistended large and small bowel.  No free air.  No organomegaly.  IMPRESSION: NG tube remains in the descending duodenum.  No evidence of bowel obstruction currently.  Original Report Authenticated By: Cyndie Chime, M.D.   Dg Abd Portable 2v  12/17/2011   *RADIOLOGY REPORT*  Clinical Data: Evaluate small bowel obstruction  PORTABLE ABDOMEN - 2 VIEW  Comparison: 12/16/2011; 12/15/2011; CT abdomen pelvis - 12/16/2011  Findings:  Grossly unchanged mild gaseous distension of several loops of small bowel in the left upper abdomen.  Air is seen within the cecum and ascending colon.  Interval advancement of enteric tube with tip now projecting over the expected location of the descending portion of the duodenum.  No definite pneumo peritoneum, pneumatosis or portal venous gas.  Limited visualization of the lower thorax suggests left basilar heterogeneous opacities, possibly atelectasis. Surgical clips are seen overlying the left mid lower abdomen. Lumbar spine degenerative change.  IMPRESSION: Grossly unchanged findings suggestive of suggestive of at least a partial small bowel obstruction.  Original Report Authenticated By: Waynard Reeds, M.D.   Medications: Scheduled Meds:   . famotidine (PEPCID) IV  20 mg Intravenous Q12H  . feeding supplement  1 Container Oral BID AC  . fluconazole (DIFLUCAN) IV  100 mg Intravenous Once  . insulin aspart  0-9 Units Subcutaneous Q4H  . labetalol  100 mg Oral BID  . levofloxacin (LEVAQUIN) IV  250 mg Intravenous Q24H  . nicotine  21 mg Transdermal Daily  . nystatin  10 mL Oral QID  . vitamin B-12  1,000 mcg Oral Daily  . DISCONTD: labetalol  100 mg Oral BID  . DISCONTD: labetalol  100 mg Oral BID   Continuous Infusions:   . sodium chloride 50 mL/hr at 12/26/11 0019   PRN Meds:.acetaminophen, dextrose, hydrALAZINE, HYDROcodone-acetaminophen, morphine injection, ondansetron (ZOFRAN) IV, DISCONTD: cloNIDine  Assessment/Plan: AKI on chronic kidney disease  CT abd and pelvis reveals atrophic rt kidney which is likely from medical-renal disease  Foley inserted by Dr Laverle Patter. Urine output has improved.  slowly Improving renal function. Will continue to monitor.    Malignant HTN  Initially BP were running low and  cardizem and labetalol   CLonidine were discontinued. Currently his BP are running high and will resume his BP medications at a lower dose.   Partial SBO  Improving. Able to pass gas. Last Bm was yesterday. Will repeat  Abdominal  X ray in am.  Surgery following  Hypoxia with low grade fever  Resolved. He is off nasal oxygen and will continue with levaquin for one more day to complete the 7 day course.  DM w/ DKA on presentation : hgba1c is 10.8 CBGs are now well controlled  Continue with  SSI.  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 Continuing saline.  Thrush- nystatin has been fiven for 2 days but no improvement has been seen.a dose of Fluconazole was given yesterday and his pain improved.  He is not having any trouble with swallowing and therefore I  doubt he has candida esophagitis.  Nicotine abuse ; on nicotine patch.    LOS: 16 days   [name] 12/26/2011, 11:33 AM

## 2011-12-26 NOTE — Progress Notes (Signed)
Utilization Review Completed.Crystin Lechtenberg T12/02/2012   

## 2011-12-26 NOTE — Progress Notes (Signed)
Subjective: Tolerating sips of clear liquids.  States his bowels are moving and he is passing gas.  Objective: Vital signs in last 24 hours: Temp:  [98.2 F (36.8 C)-98.8 F (37.1 C)] 98.8 F (37.1 C) (01/23 0639) Pulse Rate:  [75-96] 84  (01/23 0639) Resp:  [16-26] 24  (01/23 0639) BP: (146-186)/(78-101) 158/84 mmHg (01/23 0639) SpO2:  [83 %-100 %] 96 % (01/23 0639) Last BM Date: 12/23/11  Intake/Output from previous day: 2023-01-20 0701 - 01/23 0700 In: 1673 [P.O.:973; I.V.:600; IV Piggyback:100] Out: 1890 [Urine:1890] Intake/Output this shift: Total I/O In: -  Out: 850 [Urine:850]  PE: Abd-soft, nontender, nondistended  Lab Results:   Basename 12/26/11 0640 01/21/2012 0430  WBC 10.5 11.5*  HGB 10.8* 11.1*  HCT 31.1* 31.6*  PLT 221 200   BMET  Basename 01-21-12 0430 12/24/11 0500  NA 138 137  K 4.4 4.2  CL 107 106  CO2 17* 18*  GLUCOSE 120* 73  BUN 37* 53*  CREATININE 2.69* 3.86*  CALCIUM 9.0 8.2*   PT/INR No results found for this basename: LABPROT:2,INR:2 in the last 72 hours Comprehensive Metabolic Panel:    Component Value Date/Time   NA 138 Jan 21, 2012 0430   K 4.4 01/21/2012 0430   CL 107 01/21/12 0430   CO2 17* 21-Jan-2012 0430   BUN 37* 01-21-12 0430   CREATININE 2.69* 01/21/2012 0430   GLUCOSE 120* 01/21/12 0430   CALCIUM 9.0 January 21, 2012 0430   AST 14 12/10/2011 2339   ALT 11 12/10/2011 2339   ALKPHOS 102 12/10/2011 2339   BILITOT 0.2* 12/10/2011 2339   PROT 8.3 12/10/2011 2339   ALBUMIN 2.6* 2012-01-21 0430     Studies/Results: Dg Abd 2 Views  Jan 21, 2012  *RADIOLOGY REPORT*  Clinical Data: Partial small bowel obstruction.  ABDOMEN - 2 VIEW  Comparison: 12/24/2011  Findings: Upright and supine images of the abdomen again demonstrate oral contrast in the right colon.  The contrast has not significantly moved.  There appears to be decreased air fluid levels within the small bowel loops.  Overall, there is minimal gas within the small bowel.  There is gas  and stool in the left colon. Mild blunting at the left costophrenic angle could represent a tiny effusion or atelectasis. No evidence for free air.  IMPRESSION:  Decreased bowel gas within the small bowel loops.  However, the oral contrast has not moved and remains in the right colon.  Original Report Authenticated By: Richarda Overlie, M.D.    Anti-infectives: Anti-infectives     Start     Dose/Rate Route Frequency Ordered Stop   01-21-2012 1500   fluconazole (DIFLUCAN) IVPB 100 mg        100 mg 50 mL/hr over 60 Minutes Intravenous  Once 2012-01-21 1408 01-21-2012 1619   12/24/11 1600   Levofloxacin (LEVAQUIN) IVPB 250 mg        250 mg 50 mL/hr over 60 Minutes Intravenous Every 24 hours 12/24/11 1118     12/21/11 1600   Levofloxacin (LEVAQUIN) IVPB 750 mg  Status:  Discontinued        750 mg 100 mL/hr over 90 Minutes Intravenous Every 24 hours 12/21/11 1450 12/24/11 1117          Assessment Principal Problem:  *DKA (diabetic ketoacidoses) Active Problems:  HYPERTENSION  ABDOMINAL AORTIC ANEURYSM REPAIR, HX OF  Hyperlipidemia  Partial SBO (small bowel obstruction)-most likely due to adhesions-clinically improving.    LOS: 16 days   Plan: Full liquid diet.   Niccole Witthuhn  J 12/26/2011

## 2011-12-26 NOTE — Progress Notes (Signed)
   CARE MANAGEMENT NOTE 12/26/2011  Patient:  [name], [name]   Account Number:  [id number]  Date Initiated:  12/14/2011  Documentation initiated by:  Donn Pierini  Subjective/Objective Assessment:   Pt admitted with DKA,     Action/Plan:   PTA pt lived lived at home with family, was independent with ADLs   Anticipated DC Date:  12/28/2011   Anticipated DC Plan:  HOME W HOME HEALTH SERVICES      DC Planning Services  CM consult      [facility] Choice  HOME HEALTH   Choice offered to / List presented to:  C-1 Patient        HH arranged  HH-1 RN  HH-2 PT      [facility] agency  [facility]   Status of service:  In process, will continue to follow  Discharge Disposition:  HOME W HOME HEALTH SERVICES  Comments:  12/26/11 1400 [facility] RN MSN CCM PCP is Dr. Clelia Croft @ [facility].  PT recommends home health therapy, pt will also need RN.  Provided list of agencies, referral made per pt choice.  12-18-11 - 1pm Avie Arenas, RNBSN [phone] UR Completed.  12/18/11 JULIE AMERSON,RN,BSN 1152 MET WITH PT TO DISCUSS DC PLANS.  PTA, PT LIVES WITH SISTER.  HE USES A CANE TO AMBULATE, AND HAS A RW IF NEEDED AT HOME.  SISTER TO PROVIDE CARE AT DC.  WILL FOLLOW FOR HOME NEEDS AS PT PROGRESSES.  PT STARTED ON CL DIET THIS AM, AND IS TOLERATING.  12/14/11- 1530- Donn Pierini RN, BSN [phone] Pt now with SBO, CM to follow for potential d/c needs

## 2011-12-26 NOTE — Progress Notes (Signed)
Inpatient Diabetes Program Recommendations  AACE/ADA: New Consensus Statement on Inpatient Glycemic Control (2009)  Target Ranges:  Prepandial:   less than 140 mg/dL      Peak postprandial:   less than 180 mg/dL (1-2 hours)      Critically ill patients:  140 - 180 mg/dL   Reason for Visit: Results for ADALID, BECKMANN (MRN [id number]) as of 12/26/2011 12:44  Ref. Range 12/25/2011 20:04 12/26/2011 00:13 12/26/2011 03:29 12/26/2011 07:59 12/26/2011 11:29  Glucose-Capillary Latest Range: 70-99 mg/dL 130 (H) 865 (H) 784 (H) 176 (H) 233 (H)    Inpatient Diabetes Program Recommendations Insulin - Basal: Consider restarting Lantus 10 units daily. Insulin - Meal Coverage: . HgbA1C: A1c=10.8% indicating average glucose 264 mg/dL.

## 2011-12-26 NOTE — Progress Notes (Signed)
Physical Therapy Treatment Patient Details Name: [name] MRN: [id number] DOB: May 31, 1951 Today's Date: 12/26/2011  PT Assessment/Plan  PT - Assessment/Plan Comments on Treatment Session: Pt's sister present entire PT session.  She states she will be at home with pt to provide 24 hr (S)/(A).   PT Plan: Discharge plan remains appropriate;Frequency remains appropriate PT Frequency: Min 3X/week Follow Up Recommendations: Home health PT;Supervision/Assistance - 24 hour Equipment Recommended: None recommended by PT PT Goals  Acute Rehab PT Goals PT Goal: Supine/Side to Sit - Progress: Progressing toward goal PT Goal: Sit to Stand - Progress: Progressing toward goal PT Goal: Stand to Sit - Progress: Progressing toward goal PT Goal: Ambulate - Progress: Progressing toward goal PT Goal: Up/Down Stairs - Progress: Progressing toward goal  PT Treatment Precautions/Restrictions  Precautions Precautions: Fall Restrictions Weight Bearing Restrictions: No Mobility (including Balance) Bed Mobility Supine to Sit: 6: Modified independent (Device/Increase time);HOB flat Transfers Sit to Stand: 4: Min assist;Other (comment);With upper extremity assist;From bed;From chair/3-in-1 (Min Guard (A)) Sit to Stand Details (indicate cue type and reason): Min (A) with 1st trial from bed for balance but then progressed to [employer] (A) to stand from chair/mat table/bed.  Cues for hand placement.   Stand to Sit: 5: Supervision;To bed;To chair/3-in-1;Other (comment);With armrests;With upper extremity assist (mat table. ) Stand to Sit Details: Cues for hand placement & use of UE's to control descent.  Ambulation/Gait Ambulation/Gait Assistance: 5: Supervision Ambulation/Gait Assistance Details (indicate cue type and reason): (S) for safety due to mild lateral sway.  Performed lateral/vertical head turns with deviation from straight path with challenges.   Ambulation Distance (Feet): 200 Feet Assistive  device: Straight cane Gait Pattern: Step-through pattern;Decreased stride length Stairs: Yes Stairs Assistance: Other (comment) (Min Guard (A)) Stair Management Technique: Step to pattern;Forwards;One rail Left;With cane Number of Stairs: 2  Wheelchair Mobility Wheelchair Mobility: No    Exercise    End of Session PT - End of Session Equipment Utilized During Treatment: Gait belt Activity Tolerance: Patient tolerated treatment well Patient left: in chair;with call bell in reach;with family/visitor present General Behavior During Session: [facility] for tasks performed Cognition: [facility] for tasks performed  Lara Mulch 12/26/2011, 1:00 PM [phone]

## 2011-12-27 ENCOUNTER — Inpatient Hospital Stay (HOSPITAL_COMMUNITY): Payer: Medicare Other

## 2011-12-27 LAB — GLUCOSE, CAPILLARY
Glucose-Capillary: 202 mg/dL — ABNORMAL HIGH (ref 70–99)
Glucose-Capillary: 187 mg/dL — ABNORMAL HIGH (ref 70–99)

## 2011-12-27 LAB — RENAL FUNCTION PANEL
Albumin: 2.3 g/dL — ABNORMAL LOW (ref 3.5–5.2)
Chloride: 106 mEq/L (ref 96–112)
Creatinine, Ser: 1.98 mg/dL — ABNORMAL HIGH (ref 0.50–1.35)
GFR calc non Af Amer: 34 mL/min — ABNORMAL LOW (ref >90)
Phosphorus: 2.7 mg/dL (ref 2.3–4.6)
Potassium: 3.9 mEq/L (ref 3.5–5.1)

## 2011-12-27 MED ORDER — GLUCERNA SHAKE PO LIQD
237.0000 mL | Freq: Two times a day (BID) | ORAL | Status: DC
  Administered 2011-12-27 – 2011-12-29 (×5): 237 mL via ORAL

## 2011-12-27 MED ORDER — DILTIAZEM HCL 30 MG PO TABS
30.0000 mg | ORAL_TABLET | Freq: Four times a day (QID) | ORAL | Status: DC
  Administered 2011-12-27 – 2011-12-28 (×6): 30 mg via ORAL
  Filled 2011-12-27 (×9): qty 1

## 2011-12-27 MED ORDER — CLONIDINE HCL 0.2 MG PO TABS
0.2000 mg | ORAL_TABLET | Freq: Three times a day (TID) | ORAL | Status: DC
  Administered 2011-12-27 – 2011-12-29 (×8): 0.2 mg via ORAL
  Filled 2011-12-27 (×10): qty 1

## 2011-12-27 NOTE — Progress Notes (Signed)
Subjective: Comfortable, passing flatulence and bm this am.   Objective: Weight change:   Intake/Output Summary (Last 24 hours) at 12/27/11 1100 Last data filed at 12/27/11 0700  Gross per 24 hour  Intake   2200 ml  Output   1201 ml  Net    999 ml    Filed Vitals:   12/27/11 1057  BP: 180/106  Pulse:   Temp:   Resp:    General: Laying in bed in no acute distress.  HEENT: no pallor, no icterus, no JVD, no lymphadenopathy  Heart: Normal s1 &s2 Regular rate and rhythm, without murmurs, rubs, gallops.  Lungs: Clear to auscultation bilaterally.  Abdomen: Soft, nontender, non-distended, decreased bowel sounds  Extremities: No clubbing cyanosis or edema with positive pedal pulses.  Psych: Alert, awake, oriented x3, nonfocal.   Lab Results: Improving.   Micro Results: Recent Results (from the past 240 hour(s))  MRSA PCR SCREENING     Status: Normal   Collection Time   12/17/11  2:13 PM      Component Value Range Status Comment   MRSA by PCR NEGATIVE  NEGATIVE  Final   MRSA PCR SCREENING     Status: Normal   Collection Time   12/24/11  7:55 PM      Component Value Range Status Comment   MRSA by PCR NEGATIVE  NEGATIVE  Final     Studies/Results: Ct Abdomen Pelvis Wo Contrast  12/22/2011  *RADIOLOGY REPORT*  Clinical Data: Evaluate for small bowel obstruction.  Vomiting. Poor urine output and worsening renal function.  Exclude urinary tract obstruction.  CT ABDOMEN AND PELVIS WITHOUT CONTRAST  Technique:  Multidetector CT imaging of the abdomen and pelvis was performed following the standard protocol without intravenous contrast.  Comparison: CT abdomen pelvis without contrast 12/16/2011.  Findings: There is an airspace opacity with air bronchograms in the anterior right lung base, which were not present on the CT of 12/16/2011.  There is bronchiectasis in both lower lobes.  Patchy opacities in the dependent portions of both lower lobes may reflect atelectasis and/or  consolidation.  There are blebs in the lingula and medial segment the right middle lobe.  Cardiomegaly is stable. There is a stable small pericardial effusion.  No definite pleural effusion on today's study.  There is marked right renal atrophy.  This is stable compared to CT of 12/16/2011.  There is no hydronephrosis of the atrophic right kidney.  No evidence of mass or stone.  The left kidney is normal in size.  There is no hydronephrosis, stone, or evidence of mass on the left.  There are scattered calcifications in the renal arteries bilaterally. The ureters are normal in caliber.  Aneurysmal dilatation of the proximal abdominal aorta is stable and measures 4.9 cm AP diameter by 4.7 cm transverse diameter.  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  A single partially calcified gallstone is seen in the gallbladder fundus.  Gallbladder wall appears normal by CT.  The noncontrast appearance of the liver, spleen, adrenal glands, and pancreas is within normal limits.  Stomach is moderately distended.  The duodenum jejunal and proximal ileal loops are dilated, and several contain air contrast levels. Jejunum measures up to 4.2 cm in caliber.  Overall, there is no significant change in bowel dilatation compared to prior study 12/16/2011.  Distal ileal loops are decompressed.  The transition from dilated to decompressed small bowel loops is again noted  in the upper pelvis, near the level of the aortoiliac bifurcation.  There is no evidence of bowel wall thickening, pneumatosis, or free intraperitoneal air.  There is a prominent amount of stool in the colon.  There are scattered colonic diverticula from the cecum to the distal sigmoid colon.  Urinary bladder is not very distended.  Prostate gland within normal limits.  The right inguinal hernia is present and contains fat only.  No acute or suspicious bony abnormality.  IMPRESSION:  1.   Persistent small bowel obstruction pattern.  Overall, no significant change in bowel dilatation is appreciated compared to the CT of 12/16/2011.  Transition from dilated small bowel is not seen in the upper anatomic pelvis.  Question adhesions in this patient status post repair of distal abdominal aortic aneurysm with aorto by iliac graft. 2.  Stable proximal abdominal aortic aneurysm. 3.  Significant right renal atrophy.  Negative for urinary tract obstruction. 4.  Interval development of anterior right basilar airspace disease suspicious for pneumonia or aspiration.  Opacities in both lower lobes could reflect atelectasis and/or airspace disease. 5.  Colonic diverticulosis, uncomplicated. 6.  Right inguinal hernia contains fat. 7.  Cholelithiasis.  Original Report Authenticated By: Britta Mccreedy, M.D.   Ct Abdomen Pelvis Wo Contrast  12/16/2011  *RADIOLOGY REPORT*  Clinical Data: Diabetic QS doses, history hypertension, abdominal aortic aneurysm post repair, small bowel dilatation, no bowel movement for 3 days, right groin discomfort  CT ABDOMEN AND PELVIS WITHOUT CONTRAST  Technique:  Multidetector CT imaging of the abdomen and pelvis was performed following the standard protocol without intravenous contrast. Sagittal and coronal MPR images reconstructed from axial data set.  Comparison: 02/03/2006  Findings: Bilateral lower lobe atelectasis. Bullae at lung bases. Pericardial effusion and small left pleural effusion. Aneurysmal dilatation of the proximal abdominal aorta up to 4.9 x 4.8 cm, previously 4.1 x 3.9 cm. Interval resection of the large infrarenal abdominal aortic aneurysm and placement of an aortic by iliac graft.  Liver, spleen, atrophic pancreas, left kidney, and adrenal glands normal appearance. Small atrophic right kidney without mass or obstruction, with right renal atrophy new since previous exam. Dilatation of proximal small bowel loops with normal caliber distal small bowel loops compatible  with mid small bowel obstruction. Transition from dilated to nondilated small bowel occurs slightly left of midline in the upper pelvis, left anterolateral to the aortic graft bifurcation, question adhesion. No definite bowel wall thickening.  Scattered colonic diverticulosis without evidence of diverticulitis. Appendix not visualized. Bladder wall thickened but bladder is decompressed question artifact. Scattered free intraperitoneal fluid. Right inguinal hernia containing fat. No additional mass, adenopathy, or free air. No acute osseous findings.  IMPRESSION: Mid small bowel obstruction secondary to likely an adhesion in the upper central pelvis as above. Prior repair of a large distal abdominal aortic aneurysm. Interval increase in aneurysmal dilatation of the proximal abdominal aorta up to 4.9 x 4.8 cm in size. Interval right renal atrophy. Bibasilar atelectasis. Small left pleural and pericardial effusions. Right inguinal hernia containing fat.  Diffuse colonic diverticulosis.  Original Report Authenticated By: Lollie Marrow, M.D.   Ct Head Wo Contrast  12/11/2011  *RADIOLOGY REPORT*  Clinical Data: Difficulty swallowing.  Hyperglycemia and hypertension with weakness for 12 days.  CT HEAD WITHOUT CONTRAST  Technique:  Contiguous axial images were obtained from the base of the skull through the vertex without contrast.  Comparison: 02/02/2008.  Findings: No mass lesion, mass effect, midline shift, hydrocephalus, hemorrhage.  No acute territorial cortical  ischemia/infarct. Atrophy and chronic ischemic white matter disease is present.  Atrophy of the left cerebral peduncle is present associated with prior infarct.  Intracranial atherosclerosis.  The study is mildly degraded by motion artifact.  Slices were repeated. Calvarium appears intact.  Paranasal sinuses are within normal limits. Unchanged small bilateral basal ganglia lacunar infarcts.  IMPRESSION: No acute intracranial abnormality.  Atrophy and chronic  ischemic white matter disease with bilateral basal ganglia lacunar infarcts.  Original Report Authenticated By: Andreas Newport, M.D.   Mr Brain Wo Contrast  12/12/2011  *RADIOLOGY REPORT*  Clinical Data: Weakness and difficulty swallowing.  Rule out CVA.  MRI HEAD WITHOUT CONTRAST  Technique:  Multiplanar, multiecho pulse sequences of the brain and surrounding structures were obtained according to standard protocol without intravenous contrast.  Comparison: CT 12/11/2011  Findings: Negative for acute infarct.  Extensive chronic microvascular ischemia throughout the periventricular and deep cerebral white matter bilaterally.  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 Negative for mass or fluid collection.  There is generalized atrophy with prominence of the ventricles and subarachnoid space.  Paranasal sinuses are clear.  IMPRESSION: Atrophy and extensive chronic ischemic change.  No acute abnormality.  Original Report Authenticated By: Camelia Phenes, M.D.   Mr Mra Abdomen Wo Contrast  12/24/2011  *RADIOLOGY REPORT*  Clinical Data:  Elevated creatinine, oliguric renal failure, history of abdominal aortic aneurysm repair  MRA ABDOMEN WITHOUT CONTRAST  Technique:  Angiographic images of the abdomen were obtained using MRA technique without intravenous contrast.  Comparison:  12/22/2011, 02/03/2006  Findings:  Exam is limited because of respiratory motion artifact, lack of contrast because of elevated creatinine, and turbulent aortic flow because of the aneurysm.  Abdominal aorta demonstrates a residual suprarenal fusiform aneurysm measuring 5.1 x 5.1 cm at the SMA origin, series 9 image 13.  Diffuse atherosclerotic changes with mural thrombus of the abdominal aorta.  The patient is status post aorto iliac bypass graft.  The celiac, SMA, and left renal artery origins are visualized and appear patent when comparing with the 2 prior studies.  No definite significant  ostial left renal artery stenosis demonstrated within the limits of the study.  The more peripheral left renal artery is not visualized in detail.  Right renal origin also appears patent. Chronic right renal atrophy and noted.  Right kidney measures 7.4 cm.  Left kidney measures 11.4 cm.  Additional axial imaging demonstrates a single gallstone layering in the gallbladder dependently, image 11 series 9.  Diffuse small bowel dilatation of the jejunal loops compatible with ongoing small bowel obstruction, little change.  IMPRESSION: Limited exam, multifactorial as described.  Left renal artery origin is visualized on one sequence and appears patent.  However, the entire left renal artery is not visualized in detail.  Celiac and SMA origins are also patent.  Residual 5.1 cm suprarenal abdominal aortic aneurysm.  Status post infrarenal aorto bi iliac bypass graft  Chronic right renal atrophy  Cholelithiasis  Small bowel obstruction little interval change  Original Report Authenticated By: Judie Petit. Ruel Favors, M.D.   US Renal  12/12/2011  *RADIOLOGY REPORT*  Clinical Data:  Renal insufficiency.  Hypertension.  Previous abdominal aortic aneurysm repair.  RENAL/URINARY TRACT ULTRASOUND COMPLETE  Comparison:  None.  Findings:  Right Kidney:  Small in size, measuring 7.3 cm in length.  Normal parenchymal echogenicity.  No evidence of renal mass or hydronephrosis.  Left Kidney:  Normal in size and parenchymal echogenicity. Measures 10.4  cm in length.  No evidence of renal mass or hydronephrosis.  Bladder:  Appears normal for degree of bladder distention.  IMPRESSION:  1.  No evidence of hydronephrosis. 2.  Small right kidney measuring 7.3 cm in length.  No evidence of renal mass.  Original Report Authenticated By: Danae Orleans, M.D.   Dg Chest Port 1 View  12/21/2011  *RADIOLOGY REPORT*  Clinical Data: Shortness of breath, fever  PORTABLE CHEST - 1 VIEW  Comparison: Portable chest x-ray of 12/14/2011  Findings: There has  been an increase in opacities at the lung bases consistent with either atelectasis or pneumonia.  There is cardiomegaly present.  No pleural effusion is seen.  No bony abnormality is noted.  IMPRESSION: Increase in basilar opacities consistent with atelectasis or pneumonia.  Stable cardiomegaly.  Original Report Authenticated By: Juline Patch, M.D.   Dg Chest Port 1 View  12/14/2011  *RADIOLOGY REPORT*  Clinical Data: Cough.  PORTABLE CHEST - 1 VIEW  Comparison: 01/07 08/22/2012.  Findings: Dilated tortuous aorta.  Degree of uncoiling appears slightly more prominent which may be related to decreased inspiration.  Increased markings lung bases some which appear chronic. Superimposed subtle infiltrate medial aspect of the lung bases would be difficult to exclude on this portable exam.  Cardiomegaly.  No pulmonary edema or gross pneumothorax.  IMPRESSION: Dilated tortuous aorta.  Increased markings lung bases some which appear chronic. Superimposed subtle infiltrate medial aspect of the lung bases would be difficult to exclude on this portable exam.  Cardiomegaly.  Original Report Authenticated By: Fuller Canada, M.D.   Dg Chest Port 1 View  12/11/2011  *RADIOLOGY REPORT*  Clinical Data: Short of breath  PORTABLE CHEST - 1 VIEW  Comparison: Chest radiograph 02/21/2011  Findings: Stable enlarged heart silhouette with ectatic aorta. There is chronic linear scarring at the lung bases.  Emphysematous change in the upper lobes.  No pneumothorax.  IMPRESSION:  1.  No clear acute cardiopulmonary process.  2.   Chronic scarring at the lung bases. 3.  Emphysematous change.  Original Report Authenticated By: Genevive Bi, M.D.   Dg Abd 2 Views  12/27/2011  *RADIOLOGY REPORT*  Clinical Data: Evaluate for resolution of small bowel obstruction  ABDOMEN - 2 VIEW  Comparison: 12/25/2011  Findings: There has been interval progression of contrast to the level of the rectum.  Some persistent small bowel loop distention is  identified in the left upper quadrant and best evidenced on the supine film.  No air fluid levels are seen on the upright evaluation.  No free intraperitoneal air is seen.  The lung bases demonstrate some left basilar atelectasis and possible small left pleural effusion.  IMPRESSION: Progression of contrast into the distal colon suggests some improvement in small bowel obstruction.  Residual dilatation of the left upper quadrant small bowel loops suggests there may be a persistent partial obstructive process present  Original Report Authenticated By: Bertha Stakes, M.D.   Dg Abd 2 Views  12/25/2011  *RADIOLOGY REPORT*  Clinical Data: Partial small bowel obstruction.  ABDOMEN - 2 VIEW  Comparison: 12/24/2011  Findings: Upright and supine images of the abdomen again demonstrate oral contrast in the right colon.  The contrast has not significantly moved.  There appears to be decreased air fluid levels within the small bowel loops.  Overall, there is minimal gas within the small bowel.  There is gas and stool in the left colon. Mild blunting at the left costophrenic angle could represent a  tiny effusion or atelectasis. No evidence for free air.  IMPRESSION:  Decreased bowel gas within the small bowel loops.  However, the oral contrast has not moved and remains in the right colon.  Original Report Authenticated By: Richarda Overlie, M.D.   Dg Abd 2 Views  12/24/2011  *RADIOLOGY REPORT*  Clinical Data: Abdominal pain.  Follow-up abdominal aortic surgery.  ABDOMEN - 2 VIEW  Comparison: 12/22/2011 CT.  Findings:  Abnormal bowel gas pattern with gas and fluid filled small bowel loops.  Dimension of the small bowel difficult to adequately assess given the degree of fluid filled contents.  Contrast has traversed into the right colon suggesting that there is not a complete small bowel obstruction.  No free intraperitoneal air.  Basilar subsegmental atelectatic changes.  Impression: Nonspecific although abnormal bowel gas  pattern as noted above.  Original Report Authenticated By: Fuller Canada, M.D.   Dg Abd 2 Views  12/16/2011  *RADIOLOGY REPORT*  Clinical Data: Partial small bowel obstruction follow-up.  ABDOMEN - 2 VIEW  Comparison: CT of earlier in the morning at 0134 hours.  Findings: 1146 hours.  Nasogastric terminates at the gastroesophageal junction, with the side port above the GE junction.  The upright view demonstrates numerous small bowel air fluid levels, without convincing evidence of free intraperitoneal air.  Small bowel dilatation persists.  Example loop measures 4.5 cm versus 3.2 cm on the the CT of earlier today.  Remains gas within the colon.  No pneumatosis.  Distal gas identified.  Phleboliths in the pelvis.  IMPRESSION:  1.  Persistent likely minimally progressive partial small bowel obstruction. 2.  No free intraperitoneal air or other acute complication. 3.  Nasogastric tube has been retracted to the GE junction. Consider advancement.  This study was made a "call report."  Original Report Authenticated By: Consuello Bossier, M.D.   Dg Abd 2 Views  12/15/2011  *RADIOLOGY REPORT*  Clinical Data: Small bowel obstruction.  ABDOMEN - 2 VIEW  Comparison: Plain films of the abdomen and 12/13/2011 and 12/14/2011.  Findings: Short air fluid levels are again seen and small bowel. Dilated loops of small bowel are again identified measuring up to 5.6 cm.  There has been some increase in dilatation of small bowel loops in the left upper quadrant of the abdomen. No free intraperitoneal air is identified.  IMPRESSION: Mild worsening of small bowel obstruction.  Original Report Authenticated By: Bernadene Bell. D'ALESSIO, M.D.   Dg Abd 2 Views  12/14/2011  *RADIOLOGY REPORT*  Clinical Data: Follow up partial small bowel obstruction.  ABDOMEN - 2 VIEW  Comparison: 12/13/2011  Findings: Two views of the abdomen were obtained.  There is gas in the large and small bowel.  Persistent air fluid levels in the small bowel.  The  degree of small bowel distention has not significantly changed.  Stool in the right colon.  No evidence to suggest free air. There may be small pleural effusions.  No acute bony abnormalities.  IMPRESSION: Stable appearance of the dilated small bowel loops.  The findings remain compatible with a partial small bowel obstruction and minimal change since the prior examination.  Suspect small pleural effusions.  Original Report Authenticated By: Richarda Overlie, M.D.   Dg Abd 2 Views  12/13/2011  *RADIOLOGY REPORT*  Clinical Data: Mid abdominal pain with nausea and vomiting  ABDOMEN - 2 VIEW  Comparison: None.  Findings: There are several mildly dilated loops of small bowel within the left upper quadrant with differential air-fluid levels.  The colon contains stool and gas and is not dilated.  No pneumoperitoneum.  No gross organomegaly.  Surgical clips project over the mid lower abdomen.  IMPRESSION: There are several dilated small bowel loops with differential air- fluid levels in the left upper quadrant, worrisome for a partial mechanical small-bowel obstruction.  Please continue to follow closely.  No pneumoperitoneum.  Original Report Authenticated By: Brandon Melnick, M.D.   Dg Abd Portable 1v  12/21/2011  *RADIOLOGY REPORT*  Clinical Data: Abdominal distention, small bowel obstruction  PORTABLE ABDOMEN - 1 VIEW  Comparison: Abdomen films of 12/17/2011 and CT abdomen pelvis of 12/16/2011  Findings: Compared to the scout film from the CT as well as plain films from 01/14, there is somewhat less gaseous distention of the small bowel.  No colonic bowel gas is seen.  IMPRESSION: Improvement in gaseous distention of small bowel.  Original Report Authenticated By: Juline Patch, M.D.   Dg Abd Portable 2v  12/17/2011  *RADIOLOGY REPORT*  Clinical Data: Abdominal pain, possible small bowel obstruction.  PORTABLE ABDOMEN - 2 VIEW  Comparison: 12/17/2011  Findings: NG tube is in stable position within the descending  duodenum.  No evidence of bowel obstruction.  Gas within nondistended large and small bowel.  No free air.  No organomegaly.  IMPRESSION: NG tube remains in the descending duodenum.  No evidence of bowel obstruction currently.  Original Report Authenticated By: Cyndie Chime, M.D.   Dg Abd Portable 2v  12/17/2011  *RADIOLOGY REPORT*  Clinical Data: Evaluate small bowel obstruction  PORTABLE ABDOMEN - 2 VIEW  Comparison: 12/16/2011; 12/15/2011; CT abdomen pelvis - 12/16/2011  Findings:  Grossly unchanged mild gaseous distension of several loops of small bowel in the left upper abdomen.  Air is seen within the cecum and ascending colon.  Interval advancement of enteric tube with tip now projecting over the expected location of the descending portion of the duodenum.  No definite pneumo peritoneum, pneumatosis or portal venous gas.  Limited visualization of the lower thorax suggests left basilar heterogeneous opacities, possibly atelectasis. Surgical clips are seen overlying the left mid lower abdomen. Lumbar spine degenerative change.  IMPRESSION: Grossly unchanged findings suggestive of suggestive of at least a partial small bowel obstruction.  Original Report Authenticated By: Waynard Reeds, M.D.   Medications: Scheduled Meds:   . feeding supplement  1 Container Oral BID AC  . insulin aspart  0-5 Units Subcutaneous QHS  . insulin aspart  0-9 Units Subcutaneous TID WC  . labetalol  100 mg Oral BID  . levofloxacin (LEVAQUIN) IV  250 mg Intravenous Q24H  . nicotine  21 mg Transdermal Daily  . nystatin  10 mL Oral QID  . pantoprazole  40 mg Oral Q0600  . vitamin B-12  1,000 mcg Oral Daily  . DISCONTD: famotidine (PEPCID) IV  20 mg Intravenous Q12H  . DISCONTD: insulin aspart  0-9 Units Subcutaneous Q4H   Continuous Infusions:   . sodium chloride 50 mL/hr at 12/26/11 2145   PRN Meds:.acetaminophen, dextrose, hydrALAZINE, HYDROcodone-acetaminophen, morphine injection, ondansetron (ZOFRAN)  IV  Assessment/Plan: AKI on chronic kidney disease  CT abd and pelvis reveals atrophic rt kidney which is likely from medical-renal disease  Foley inserted by Dr Laverle Patter. Urine output has improved.  slowly Improving renal function. Will continue to monitor.  Malignant HTN  Currently his BP are running high and will resume his BP medications at a lower dose.  Partial SBO  Improving. Able to pass gas. Last Bm  today.  repeat Abdominal X ray shows improved sbo Advance diet.  Hypoxia with low grade fever  Resolved. He is off nasal oxygen and last day of levaquin to complete 7 day course.  DM w/ DKA on presentation : hgba1c is 10.8  CBGs are now well controlled  Continue with SSI.  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 Continuing saline.  Thrush- nystatin has been fiven for 2 days but no improvement has been seen.a dose of Fluconazole was given yesterday and his pain improved. He is not having any trouble with swallowing and therefore I doubt he has candida esophagitis.  Nicotine abuse ; on nicotine patch.    LOS: 17 days   [name] 12/27/2011, 11:00 AM

## 2011-12-27 NOTE — Progress Notes (Signed)
Inpatient Diabetes Program Recommendations  AACE/ADA: New Consensus Statement on Inpatient Glycemic Control (2009)  Target Ranges:  Prepandial:   less than 140 mg/dL      Peak postprandial:   less than 180 mg/dL (1-2 hours)      Critically ill patients:  140 - 180 mg/dL   Reason for Visit:Results for LATRAVIOUS, LEVITT (MRN [id number]) as of 12/27/2011 12:11  Ref. Range 12/26/2011 11:29 12/26/2011 16:25 12/26/2011 21:34 12/27/2011 07:06 12/27/2011 11:35  Glucose-Capillary Latest Range: 70-99 mg/dL 409 (H) 811 (H) 914 (H) 172 (H) 201 (H)     Inpatient Diabetes Program Recommendations Insulin - Basal: Consider restarting Lantus 8 units daily. Insulin - Meal Coverage: . HgbA1C: A1c=10.8% indicating average glucose 264 mg/dL.  Note:

## 2011-12-27 NOTE — Progress Notes (Signed)
Patient ID: [name], male   DOB: [birth year], 61 y.o.   MRN: [id number]    Subjective: Denies any abd pain and reports he is tolerating full liquids well with no nausea. Passing flatus and had a BM  Objective: Vital signs in last 24 hours: Temp:  [98.2 F (36.8 C)-98.9 F (37.2 C)] 98.9 F (37.2 C) 01-02-23 0600) Pulse Rate:  [73-91] 75  01-02-2023 0600) Resp:  [16-18] 16  2023/01/02 0600) BP: (149-168)/(81-90) 168/85 mmHg 2023/01/02 0600) SpO2:  [95 %-98 %] 96 % 01/02/23 0600) Last BM Date: 12/26/11  Intake/Output from previous day: 01/23 0701 - 01-02-2023 0700 In: 2680 [P.O.:880; I.V.:1800] Out: 2551 [Urine:2550; Stool:1] Intake/Output this shift:    PE: Abd-+BS, soft, non-tender, non-distended  Lab Results:   [facility] 12/26/11 0640 12/25/11 0430  WBC 10.5 11.5*  HGB 10.8* 11.1*  HCT 31.1* 31.6*  PLT 221 200   BMET  Basename 2012/01/03 0526 12/26/11 0640  NA 136 135  K 3.9 3.9  CL 106 104  CO2 21 19  GLUCOSE 162* 151*  BUN 17 24*  CREATININE 1.98* 2.06*  CALCIUM 8.3* 8.2*   PT/INR No results found for this basename: LABPROT:2,INR:2 in the last 72 hours Comprehensive Metabolic Panel:    Component Value Date/Time   NA 136 01/03/12 0526   K 3.9 01-03-2012 0526   CL 106 2012/01/03 0526   CO2 21 01/03/2012 0526   BUN 17 01-03-2012 0526   CREATININE 1.98* Jan 03, 2012 0526   GLUCOSE 162* 2012-01-03 0526   CALCIUM 8.3* Jan 03, 2012 0526   AST 14 12/10/2011 2339   ALT 11 12/10/2011 2339   ALKPHOS 102 12/10/2011 2339   BILITOT 0.2* 12/10/2011 2339   PROT 8.3 12/10/2011 2339   ALBUMIN 2.3* 2012/01/03 0526     Studies/Results: Dg Abd 2 Views  01/03/2012  *RADIOLOGY REPORT*  Clinical Data: Evaluate for resolution of small bowel obstruction  ABDOMEN - 2 VIEW  Comparison: 12/25/2011  Findings: There has been interval progression of contrast to the level of the rectum.  Some persistent small bowel loop distention is identified in the left upper quadrant and best evidenced on the supine film.   No air fluid levels are seen on the upright evaluation.  No free intraperitoneal air is seen.  The lung bases demonstrate some left basilar atelectasis and possible small left pleural effusion.  IMPRESSION: Progression of contrast into the distal colon suggests some improvement in small bowel obstruction.  Residual dilatation of the left upper quadrant small bowel loops suggests there may be a persistent partial obstructive process present  Original Report Authenticated By: Bertha Stakes, M.D.    Anti-infectives: Anti-infectives     Start     Dose/Rate Route Frequency Ordered Stop   12/25/11 1500   fluconazole (DIFLUCAN) IVPB 100 mg        100 mg 50 mL/hr over 60 Minutes Intravenous  Once 12/25/11 1408 12/25/11 1619   12/24/11 1600   Levofloxacin (LEVAQUIN) IVPB 250 mg        250 mg 50 mL/hr over 60 Minutes Intravenous Every 24 hours 12/24/11 1118     12/21/11 1600   Levofloxacin (LEVAQUIN) IVPB 750 mg  Status:  Discontinued        750 mg 100 mL/hr over 90 Minutes Intravenous Every 24 hours 12/21/11 1450 12/24/11 1117          Assessment Principal Problem:  *DKA (diabetic ketoacidoses) Active Problems:  HYPERTENSION  ABDOMINAL AORTIC ANEURYSM REPAIR, HX OF  Hyperlipidemia  Partial SBO (small bowel obstruction)-most likely due to adhesions-clinically continues to improve, will advance to soft diet. Will sign off, please re-consult prn   LOS: 17 days     Mayuri Staples,PA-C Pager [phone] General Trauma Pager [phone]

## 2011-12-27 NOTE — Progress Notes (Signed)
Patient seen and examined.  Agree with PA's note.  

## 2011-12-27 NOTE — Progress Notes (Addendum)
Nutrition Follow-up  Diet Order:  Dysphagia 3/Thin  Meds: Scheduled Meds:   . cloNIDine  0.2 mg Oral TID  . diltiazem  30 mg Oral Q6H  . feeding supplement  1 Container Oral BID AC  . insulin aspart  0-5 Units Subcutaneous QHS  . insulin aspart  0-9 Units Subcutaneous TID WC  . labetalol  100 mg Oral BID  . levofloxacin (LEVAQUIN) IV  250 mg Intravenous Q24H  . nicotine  21 mg Transdermal Daily  . nystatin  10 mL Oral QID  . pantoprazole  40 mg Oral Q0600  . vitamin B-12  1,000 mcg Oral Daily   Continuous Infusions:   . sodium chloride 50 mL/hr at 12/26/11 2145   PRN Meds:.acetaminophen, dextrose, hydrALAZINE, HYDROcodone-acetaminophen, morphine injection, ondansetron (ZOFRAN) IV  Labs:  CMP     Component Value Date/Time   NA 136 12/27/2011 0526   K 3.9 12/27/2011 0526   CL 106 12/27/2011 0526   CO2 21 12/27/2011 0526   GLUCOSE 162* 12/27/2011 0526   BUN 17 12/27/2011 0526   CREATININE 1.98* 12/27/2011 0526   CALCIUM 8.3* 12/27/2011 0526   PROT 8.3 12/10/2011 2339   ALBUMIN 2.3* 12/27/2011 0526   AST 14 12/10/2011 2339   ALT 11 12/10/2011 2339   ALKPHOS 102 12/10/2011 2339   BILITOT 0.2* 12/10/2011 2339   GFRNONAA 34* 12/27/2011 0526   GFRAA 40* 12/27/2011 0526   CBG (last 3)   Basename 12/27/11 1135 12/27/11 0706 12/26/11 2134  GLUCAP 201* 172* 232*    Lab Results  Component Value Date   HGBA1C 10.8* 12/11/2011     Intake/Output Summary (Last 24 hours) at 12/27/11 1147 Last data filed at 12/27/11 0700  Gross per 24 hour  Intake   2200 ml  Output   1201 ml  Net    999 ml    Weight Status:  147 lbs Wt Readings from Last 3 Encounters:  12/25/11 147 lb 11.3 oz (67 kg)  08/08/11 176 lb (79.833 kg)   Noted pt meets criteria for severe malnutrition in the context of acute illness. Pt confirms wt loss occurred due to N/V PTA. Pt reports that he is toleranting full liquid diet well. Noted thrush in pt's mouth (tongue is white) pt denies any trouble eating or change in appetite  due to thrush. Diet advanced for lunch to Dys 3/Thin. Noted DM not controlled with hgbA1C of 10.8 Pt confirms that sister provides meals at home. Spoke at length with sister (pt went to sleep) about DM diet. Reviewed usual intake. Focus on sources of CHO and meal balance. See education note. Per sister pt to follow up with MD at [facility]. Per sister they will have [facility] after discharge, pt would benefit from continued DM education.  Re-estimated needs:   Kcals: 1800-2000 Protein: 85-100 grams Fluid: >1.8L/day  Nutrition Dx: Inadequate oral intake (NI-2.1). Status: Ongoing  Goal:  Consume >90% of estimated needs; not met.  Intervention:    D/C Resource  Glucerna Shake bid  DM education (See pt ed tab)  Monitor:  Po intake, cbs's, diet tolerance   Kendell Bane Cornelison Pager #:  [phone]

## 2011-12-27 NOTE — Progress Notes (Signed)
   CARE MANAGEMENT NOTE 12/27/2011  Patient:  [name], [name]   Account Number:  [id number]  Date Initiated:  12/14/2011  Documentation initiated by:  Donn Pierini  Subjective/Objective Assessment:   Pt admitted with DKA,     Action/Plan:   PTA pt lived lived at home with family, was independent with ADLs   Anticipated DC Date:  12/28/2011   Anticipated DC Plan:  HOME W HOME HEALTH SERVICES      DC Planning Services  CM consult      [facility] Choice  HOME HEALTH   Choice offered to / List presented to:  C-1 Patient        HH arranged  HH-1 RN  HH-2 PT      [facility] agency  [facility]   Status of service:  In process, will continue to follow  Discharge Disposition:  HOME W HOME HEALTH SERVICES  Comments:  12/27/11 1400 [facility] RN MSN CCM PCP is Dr. Clelia Croft @ [facility].  PT recommends home health therapy, pt will also need RN to assess/monitor BP, adjustment to meds.  Provided list of agencies, referral made per pt choice.  12-18-11 - 1pm [name], RNBSN [phone] UR Completed.  12/18/11 Gene AMERSON,RN,BSN 1152 MET WITH PT TO DISCUSS DC PLANS.  PTA, PT LIVES WITH SISTER.  HE USES A CANE TO AMBULATE, AND HAS A RW IF NEEDED AT HOME.  SISTER TO PROVIDE CARE AT DC.  WILL FOLLOW FOR HOME NEEDS AS PT PROGRESSES.  PT STARTED ON CL DIET THIS AM, AND IS TOLERATING.  12/14/11- 1530- Donn Pierini RN, BSN [phone] Pt now with SBO, CM to follow for potential d/c needs

## 2011-12-28 LAB — RENAL FUNCTION PANEL
Albumin: 2.2 g/dL — ABNORMAL LOW (ref 3.5–5.2)
BUN: 17 mg/dL (ref 6–23)
Calcium: 8 mg/dL — ABNORMAL LOW (ref 8.4–10.5)
Chloride: 103 mEq/L (ref 96–112)
Creatinine, Ser: 1.95 mg/dL — ABNORMAL HIGH (ref 0.50–1.35)
GFR calc non Af Amer: 35 mL/min — ABNORMAL LOW (ref >90)
Phosphorus: 2.3 mg/dL (ref 2.3–4.6)

## 2011-12-28 LAB — CBC
MCH: 29.3 pg (ref 26.0–34.0)
MCHC: 34.7 g/dL (ref 30.0–36.0)
MCV: 84.6 fL (ref 78.0–100.0)
Platelets: 195 10*3/uL (ref 150–400)
RBC: 3.24 MIL/uL — ABNORMAL LOW (ref 4.22–5.81)
RDW: 14.3 % (ref 11.5–15.5)

## 2011-12-28 LAB — GLUCOSE, CAPILLARY

## 2011-12-28 MED ORDER — DILTIAZEM HCL ER COATED BEADS 360 MG PO CP24
360.0000 mg | ORAL_CAPSULE | Freq: Every day | ORAL | Status: DC
  Administered 2011-12-29: 360 mg via ORAL
  Filled 2011-12-28: qty 1

## 2011-12-28 NOTE — Progress Notes (Signed)
Physical Therapy Treatment Patient Details Name: [name] MRN: [id number] DOB: Jun 25, 1951 Today's Date: 12/28/2011  PT Assessment/Plan  PT - Assessment/Plan Comments on Treatment Session: Pt seemed to have increased unsteadiness during ambulation today therefore transitioned from use of cane to RW with increased steadiness noted.   PT Frequency: Min 3X/week Follow Up Recommendations: Home health PT;Supervision/Assistance - 24 hour Equipment Recommended: None recommended by PT PT Goals  Acute Rehab PT Goals PT Goal: Supine/Side to Sit - Progress: Met PT Goal: Sit to Stand - Progress: Progressing toward goal PT Goal: Stand to Sit - Progress: Progressing toward goal PT Goal: Ambulate - Progress: Progressing toward goal PT Goal: Up/Down Stairs - Progress: Progressing toward goal  PT Treatment Precautions/Restrictions  Precautions Precautions: Fall Restrictions Weight Bearing Restrictions: No Mobility (including Balance) Bed Mobility Supine to Sit: 7: Independent;HOB flat Transfers Sit to Stand: 5: Supervision;Other (comment) (Min Guard (A)) Sit to Stand Details (indicate cue type and reason): Min Guard (A) initially but progressed to (S).  Pt performed 5x's for instruction, strengthening, & activity tolerance.  Increased steadiness & ease with achieving standing when used both hands vs using 1 UE to push up with & other hand holding onto cane.  Encouraged pt to use both hands to push up & then advance hand to cane.   Stand to Sit: 6: Modified independent (Device/Increase time);With upper extremity assist;With armrests Stand to Sit Details: does well with controlling descent with use of both UE's.   Ambulation/Gait Ambulation/Gait Assistance: Other (comment);5: Supervision (Min Guard (A) with use of Cane ) Ambulation/Gait Assistance Details (indicate cue type and reason): (S) with use of RW & Min Guard (A) with cane.  Pt seemed to have increased lateral sway & gait deviations  with cane today compared to yesterday.  Stepping very close to midline & even ocassional crossing midline with distractions.  Pt. Agreeable to use RW.  Increased steadiness noted with RW.   Ambulation Distance (Feet): 160 Feet Assistive device: Rolling walker;Straight cane Gait Pattern: Step-through pattern;Decreased stride length Stairs Assistance: 5: Supervision Stairs Assistance Details (indicate cue type and reason): (S) for safety.   Stair Management Technique: Step to pattern;Forwards;One rail Left;With cane Number of Stairs: 2  Wheelchair Mobility Wheelchair Mobility: No  Static Standing Balance Static Standing - Balance Support: No upper extremity supported Static Standing - Level of Assistance: 5: Stand by assistance Static Standing - Comment/# of Minutes: Performed standing balance activities consisting of: looking over shoulders (no weight shift to either side evident), perturbations at hips & shoulders in all directions with difficulty maintaining static stance, standing with eyes open/closed, lateral stepping.   Exercise    End of Session PT - End of Session Equipment Utilized During Treatment: Gait belt Activity Tolerance: Patient tolerated treatment well Patient left: in chair;with call bell in reach Nurse Communication: Mobility status for ambulation (use of RW to provide increased balance/safety. ) General Behavior During Session: [facility] for tasks performed Cognition: [facility] for tasks performed  Lara Mulch 12/28/2011, 2:30 PM [phone]

## 2011-12-28 NOTE — Progress Notes (Signed)
Subjective: No new complaints  Objective: Weight change:   Intake/Output Summary (Last 24 hours) at 12/28/11 1950 Last data filed at 12/28/11 1856  Gross per 24 hour  Intake    460 ml  Output   1276 ml  Net   -816 ml    Filed Vitals:   12/28/11 1856  BP: 165/84  Pulse: 61  Temp: 98.9 F (37.2 C)  Resp: 20   General: Laying in bed in no acute distress.  HEENT: no pallor, no icterus, no JVD, no lymphadenopathy  Heart: Normal s1 &s2 Regular rate and rhythm, without murmurs, rubs, gallops.  Lungs: Clear to auscultation bilaterally.  Abdomen: Soft, nontender, non-distended, decreased bowel sounds  Extremities: No clubbing cyanosis or edema with positive pedal pulses.  Psych: Alert, awake, oriented x3, nonfocal.   Lab Results: Results for orders placed during the hospital encounter of 12/10/11 (from the past 24 hour(s))  GLUCOSE, CAPILLARY     Status: Abnormal   Collection Time   12/27/11 10:55 PM      Component Value Range   Glucose-Capillary 187 (*) 70 - 99 (mg/dL)   Comment 1 Documented in Chart     Comment 2 Notify RN    RENAL FUNCTION PANEL     Status: Abnormal   Collection Time   12/28/11  5:00 AM      Component Value Range   Sodium 135  135 - 145 (mEq/L)   Potassium 4.1  3.5 - 5.1 (mEq/L)   Chloride 103  96 - 112 (mEq/L)   CO2 21  19 - 32 (mEq/L)   Glucose, Bld 199 (*) 70 - 99 (mg/dL)   BUN 17  6 - 23 (mg/dL)   Creatinine, Ser 4.69 (*) 0.50 - 1.35 (mg/dL)   Calcium 8.0 (*) 8.4 - 10.5 (mg/dL)   Phosphorus 2.3  2.3 - 4.6 (mg/dL)   Albumin 2.2 (*) 3.5 - 5.2 (g/dL)   GFR calc non Af Amer 35 (*) >90 (mL/min)   GFR calc Af Amer 41 (*) >90 (mL/min)  CBC     Status: Abnormal   Collection Time   12/28/11  5:00 AM      Component Value Range   WBC 10.4  4.0 - 10.5 (K/uL)   RBC 3.24 (*) 4.22 - 5.81 (MIL/uL)   Hemoglobin 9.5 (*) 13.0 - 17.0 (g/dL)   HCT 62.9 (*) 52.8 - 52.0 (%)   MCV 84.6  78.0 - 100.0 (fL)   MCH 29.3  26.0 - 34.0 (pg)   MCHC 34.7  30.0 - 36.0  (g/dL)   RDW 41.3  24.4 - 01.0 (%)   Platelets 195  150 - 400 (K/uL)  GLUCOSE, CAPILLARY     Status: Abnormal   Collection Time   12/28/11  6:35 AM      Component Value Range   Glucose-Capillary 186 (*) 70 - 99 (mg/dL)   Comment 1 Documented in Chart     Comment 2 Notify RN    GLUCOSE, CAPILLARY     Status: Abnormal   Collection Time   12/28/11 11:52 AM      Component Value Range   Glucose-Capillary 244 (*) 70 - 99 (mg/dL)   Comment 1 Documented in Chart     Comment 2 Notify RN    GLUCOSE, CAPILLARY     Status: Abnormal   Collection Time   12/28/11  5:01 PM      Component Value Range   Glucose-Capillary 151 (*) 70 - 99 (mg/dL)  Micro Results: Recent Results (from the past 240 hour(s))  MRSA PCR SCREENING     Status: Normal   Collection Time   12/24/11  7:55 PM      Component Value Range Status Comment   MRSA by PCR NEGATIVE  NEGATIVE  Final     Studies/Results: Ct Abdomen Pelvis Wo Contrast  12/22/2011  *RADIOLOGY REPORT*  Clinical Data: Evaluate for small bowel obstruction.  Vomiting. Poor urine output and worsening renal function.  Exclude urinary tract obstruction.  CT ABDOMEN AND PELVIS WITHOUT CONTRAST  Technique:  Multidetector CT imaging of the abdomen and pelvis was performed following the standard protocol without intravenous contrast.  Comparison: CT abdomen pelvis without contrast 12/16/2011.  Findings: There is an airspace opacity with air bronchograms in the anterior right lung base, which were not present on the CT of 12/16/2011.  There is bronchiectasis in both lower lobes.  Patchy opacities in the dependent portions of both lower lobes may reflect atelectasis and/or consolidation.  There are blebs in the lingula and medial segment the right middle lobe.  Cardiomegaly is stable. There is a stable small pericardial effusion.  No definite pleural effusion on today's study.  There is marked right renal atrophy.  This is stable compared to CT of 12/16/2011.  There is no  hydronephrosis of the atrophic right kidney.  No evidence of mass or stone.  The left kidney is normal in size.  There is no hydronephrosis, stone, or evidence of mass on the left.  There are scattered calcifications in the renal arteries bilaterally. The ureters are normal in caliber.  Aneurysmal dilatation of the proximal abdominal aorta is stable and measures 4.9 cm AP diameter by 4.7 cm transverse diameter.  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  A single partially calcified gallstone is seen in the gallbladder fundus.  Gallbladder wall appears normal by CT.  The noncontrast appearance of the liver, spleen, adrenal glands, and pancreas is within normal limits.  Stomach is moderately distended.  The duodenum jejunal and proximal ileal loops are dilated, and several contain air contrast levels. Jejunum measures up to 4.2 cm in caliber.  Overall, there is no significant change in bowel dilatation compared to prior study 12/16/2011.  Distal ileal loops are decompressed.  The transition from dilated to decompressed small bowel loops is again noted in the upper pelvis, near the level of the aortoiliac bifurcation.  There is no evidence of bowel wall thickening, pneumatosis, or free intraperitoneal air.  There is a prominent amount of stool in the colon.  There are scattered colonic diverticula from the cecum to the distal sigmoid colon.  Urinary bladder is not very distended.  Prostate gland within normal limits.  The right inguinal hernia is present and contains fat only.  No acute or suspicious bony abnormality.  IMPRESSION:  1.  Persistent small bowel obstruction pattern.  Overall, no significant change in bowel dilatation is appreciated compared to the CT of 12/16/2011.  Transition from dilated small bowel is not seen in the upper anatomic pelvis.  Question adhesions in this patient status post repair of distal abdominal aortic  aneurysm with aorto by iliac graft. 2.  Stable proximal abdominal aortic aneurysm. 3.  Significant right renal atrophy.  Negative for urinary tract obstruction. 4.  Interval development of anterior right basilar airspace disease suspicious for pneumonia or aspiration.  Opacities in both lower lobes could reflect atelectasis and/or airspace disease.  5.  Colonic diverticulosis, uncomplicated. 6.  Right inguinal hernia contains fat. 7.  Cholelithiasis.  Original Report Authenticated By: Britta Mccreedy, M.D.   Ct Abdomen Pelvis Wo Contrast  12/16/2011  *RADIOLOGY REPORT*  Clinical Data: Diabetic QS doses, history hypertension, abdominal aortic aneurysm post repair, small bowel dilatation, no bowel movement for 3 days, right groin discomfort  CT ABDOMEN AND PELVIS WITHOUT CONTRAST  Technique:  Multidetector CT imaging of the abdomen and pelvis was performed following the standard protocol without intravenous contrast. Sagittal and coronal MPR images reconstructed from axial data set.  Comparison: 02/03/2006  Findings: Bilateral lower lobe atelectasis. Bullae at lung bases. Pericardial effusion and small left pleural effusion. Aneurysmal dilatation of the proximal abdominal aorta up to 4.9 x 4.8 cm, previously 4.1 x 3.9 cm. Interval resection of the large infrarenal abdominal aortic aneurysm and placement of an aortic by iliac graft.  Liver, spleen, atrophic pancreas, left kidney, and adrenal glands normal appearance. Small atrophic right kidney without mass or obstruction, with right renal atrophy new since previous exam. Dilatation of proximal small bowel loops with normal caliber distal small bowel loops compatible with mid small bowel obstruction. Transition from dilated to nondilated small bowel occurs slightly left of midline in the upper pelvis, left anterolateral to the aortic graft bifurcation, question adhesion. No definite bowel wall thickening.  Scattered colonic diverticulosis without evidence of  diverticulitis. Appendix not visualized. Bladder wall thickened but bladder is decompressed question artifact. Scattered free intraperitoneal fluid. Right inguinal hernia containing fat. No additional mass, adenopathy, or free air. No acute osseous findings.  IMPRESSION: Mid small bowel obstruction secondary to likely an adhesion in the upper central pelvis as above. Prior repair of a large distal abdominal aortic aneurysm. Interval increase in aneurysmal dilatation of the proximal abdominal aorta up to 4.9 x 4.8 cm in size. Interval right renal atrophy. Bibasilar atelectasis. Small left pleural and pericardial effusions. Right inguinal hernia containing fat.  Diffuse colonic diverticulosis.  Original Report Authenticated By: Lollie Marrow, M.D.   Ct Head Wo Contrast  12/11/2011  *RADIOLOGY REPORT*  Clinical Data: Difficulty swallowing.  Hyperglycemia and hypertension with weakness for 12 days.  CT HEAD WITHOUT CONTRAST  Technique:  Contiguous axial images were obtained from the base of the skull through the vertex without contrast.  Comparison: 02/02/2008.  Findings: No mass lesion, mass effect, midline shift, hydrocephalus, hemorrhage.  No acute territorial cortical ischemia/infarct. Atrophy and chronic ischemic white matter disease is present.  Atrophy of the left cerebral peduncle is present associated with prior infarct.  Intracranial atherosclerosis.  The study is mildly degraded by motion artifact.  Slices were repeated. Calvarium appears intact.  Paranasal sinuses are within normal limits. Unchanged small bilateral basal ganglia lacunar infarcts.  IMPRESSION: No acute intracranial abnormality.  Atrophy and chronic ischemic white matter disease with bilateral basal ganglia lacunar infarcts.  Original Report Authenticated By: Andreas Newport, M.D.   Mr Brain Wo Contrast  12/12/2011  *RADIOLOGY REPORT*  Clinical Data: Weakness and difficulty swallowing.  Rule out CVA.  MRI HEAD WITHOUT CONTRAST  Technique:   Multiplanar, multiecho pulse sequences of the brain and surrounding structures were obtained according to standard protocol without intravenous contrast.  Comparison: CT 12/11/2011  Findings: Negative for acute infarct.  Extensive chronic microvascular ischemia throughout the periventricular and deep cerebral white matter bilaterally.  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 Negative for mass or fluid collection.  There is generalized atrophy with prominence  of the ventricles and subarachnoid space.  Paranasal sinuses are clear.  IMPRESSION: Atrophy and extensive chronic ischemic change.  No acute abnormality.  Original Report Authenticated By: Camelia Phenes, M.D.   Mr Mra Abdomen Wo Contrast  12/24/2011  *RADIOLOGY REPORT*  Clinical Data:  Elevated creatinine, oliguric renal failure, history of abdominal aortic aneurysm repair  MRA ABDOMEN WITHOUT CONTRAST  Technique:  Angiographic images of the abdomen were obtained using MRA technique without intravenous contrast.  Comparison:  12/22/2011, 02/03/2006  Findings:  Exam is limited because of respiratory motion artifact, lack of contrast because of elevated creatinine, and turbulent aortic flow because of the aneurysm.  Abdominal aorta demonstrates a residual suprarenal fusiform aneurysm measuring 5.1 x 5.1 cm at the SMA origin, series 9 image 13.  Diffuse atherosclerotic changes with mural thrombus of the abdominal aorta.  The patient is status post aorto iliac bypass graft.  The celiac, SMA, and left renal artery origins are visualized and appear patent when comparing with the 2 prior studies.  No definite significant ostial left renal artery stenosis demonstrated within the limits of the study.  The more peripheral left renal artery is not visualized in detail.  Right renal origin also appears patent. Chronic right renal atrophy and noted.  Right kidney measures 7.4 cm.  Left kidney measures 11.4 cm.   Additional axial imaging demonstrates a single gallstone layering in the gallbladder dependently, image 11 series 9.  Diffuse small bowel dilatation of the jejunal loops compatible with ongoing small bowel obstruction, little change.  IMPRESSION: Limited exam, multifactorial as described.  Left renal artery origin is visualized on one sequence and appears patent.  However, the entire left renal artery is not visualized in detail.  Celiac and SMA origins are also patent.  Residual 5.1 cm suprarenal abdominal aortic aneurysm.  Status post infrarenal aorto bi iliac bypass graft  Chronic right renal atrophy  Cholelithiasis  Small bowel obstruction little interval change  Original Report Authenticated By: Judie Petit. Ruel Favors, M.D.   US Renal  12/12/2011  *RADIOLOGY REPORT*  Clinical Data:  Renal insufficiency.  Hypertension.  Previous abdominal aortic aneurysm repair.  RENAL/URINARY TRACT ULTRASOUND COMPLETE  Comparison:  None.  Findings:  Right Kidney:  Small in size, measuring 7.3 cm in length.  Normal parenchymal echogenicity.  No evidence of renal mass or hydronephrosis.  Left Kidney:  Normal in size and parenchymal echogenicity. Measures 10.4 cm in length.  No evidence of renal mass or hydronephrosis.  Bladder:  Appears normal for degree of bladder distention.  IMPRESSION:  1.  No evidence of hydronephrosis. 2.  Small right kidney measuring 7.3 cm in length.  No evidence of renal mass.  Original Report Authenticated By: Danae Orleans, M.D.   Dg Chest Port 1 View  12/21/2011  *RADIOLOGY REPORT*  Clinical Data: Shortness of breath, fever  PORTABLE CHEST - 1 VIEW  Comparison: Portable chest x-ray of 12/14/2011  Findings: There has been an increase in opacities at the lung bases consistent with either atelectasis or pneumonia.  There is cardiomegaly present.  No pleural effusion is seen.  No bony abnormality is noted.  IMPRESSION: Increase in basilar opacities consistent with atelectasis or pneumonia.  Stable  cardiomegaly.  Original Report Authenticated By: Juline Patch, M.D.   Dg Chest Port 1 View  12/14/2011  *RADIOLOGY REPORT*  Clinical Data: Cough.  PORTABLE CHEST - 1 VIEW  Comparison: 01/07 08/22/2012.  Findings: Dilated tortuous aorta.  Degree of uncoiling appears slightly more prominent which  may be related to decreased inspiration.  Increased markings lung bases some which appear chronic. Superimposed subtle infiltrate medial aspect of the lung bases would be difficult to exclude on this portable exam.  Cardiomegaly.  No pulmonary edema or gross pneumothorax.  IMPRESSION: Dilated tortuous aorta.  Increased markings lung bases some which appear chronic. Superimposed subtle infiltrate medial aspect of the lung bases would be difficult to exclude on this portable exam.  Cardiomegaly.  Original Report Authenticated By: Fuller Canada, M.D.   Dg Chest Port 1 View  12/11/2011  *RADIOLOGY REPORT*  Clinical Data: Short of breath  PORTABLE CHEST - 1 VIEW  Comparison: Chest radiograph 02/21/2011  Findings: Stable enlarged heart silhouette with ectatic aorta. There is chronic linear scarring at the lung bases.  Emphysematous change in the upper lobes.  No pneumothorax.  IMPRESSION:  1.  No clear acute cardiopulmonary process.  2.   Chronic scarring at the lung bases. 3.  Emphysematous change.  Original Report Authenticated By: Genevive Bi, M.D.   Dg Abd 2 Views  12/27/2011  *RADIOLOGY REPORT*  Clinical Data: Evaluate for resolution of small bowel obstruction  ABDOMEN - 2 VIEW  Comparison: 12/25/2011  Findings: There has been interval progression of contrast to the level of the rectum.  Some persistent small bowel loop distention is identified in the left upper quadrant and best evidenced on the supine film.  No air fluid levels are seen on the upright evaluation.  No free intraperitoneal air is seen.  The lung bases demonstrate some left basilar atelectasis and possible small left pleural effusion.   IMPRESSION: Progression of contrast into the distal colon suggests some improvement in small bowel obstruction.  Residual dilatation of the left upper quadrant small bowel loops suggests there may be a persistent partial obstructive process present  Original Report Authenticated By: Bertha Stakes, M.D.   Dg Abd 2 Views  12/25/2011  *RADIOLOGY REPORT*  Clinical Data: Partial small bowel obstruction.  ABDOMEN - 2 VIEW  Comparison: 12/24/2011  Findings: Upright and supine images of the abdomen again demonstrate oral contrast in the right colon.  The contrast has not significantly moved.  There appears to be decreased air fluid levels within the small bowel loops.  Overall, there is minimal gas within the small bowel.  There is gas and stool in the left colon. Mild blunting at the left costophrenic angle could represent a tiny effusion or atelectasis. No evidence for free air.  IMPRESSION:  Decreased bowel gas within the small bowel loops.  However, the oral contrast has not moved and remains in the right colon.  Original Report Authenticated By: Richarda Overlie, M.D.   Dg Abd 2 Views  12/24/2011  *RADIOLOGY REPORT*  Clinical Data: Abdominal pain.  Follow-up abdominal aortic surgery.  ABDOMEN - 2 VIEW  Comparison: 12/22/2011 CT.  Findings:  Abnormal bowel gas pattern with gas and fluid filled small bowel loops.  Dimension of the small bowel difficult to adequately assess given the degree of fluid filled contents.  Contrast has traversed into the right colon suggesting that there is not a complete small bowel obstruction.  No free intraperitoneal air.  Basilar subsegmental atelectatic changes.  Impression: Nonspecific although abnormal bowel gas pattern as noted above.  Original Report Authenticated By: Fuller Canada, M.D.   Dg Abd 2 Views  12/16/2011  *RADIOLOGY REPORT*  Clinical Data: Partial small bowel obstruction follow-up.  ABDOMEN - 2 VIEW  Comparison: CT of earlier in the morning at 0134 hours.  Findings: 1146 hours.  Nasogastric terminates at the gastroesophageal junction, with the side port above the GE junction.  The upright view demonstrates numerous small bowel air fluid levels, without convincing evidence of free intraperitoneal air.  Small bowel dilatation persists.  Example loop measures 4.5 cm versus 3.2 cm on the the CT of earlier today.  Remains gas within the colon.  No pneumatosis.  Distal gas identified.  Phleboliths in the pelvis.  IMPRESSION:  1.  Persistent likely minimally progressive partial small bowel obstruction. 2.  No free intraperitoneal air or other acute complication. 3.  Nasogastric tube has been retracted to the GE junction. Consider advancement.  This study was made a "call report."  Original Report Authenticated By: Consuello Bossier, M.D.   Dg Abd 2 Views  12/15/2011  *RADIOLOGY REPORT*  Clinical Data: Small bowel obstruction.  ABDOMEN - 2 VIEW  Comparison: Plain films of the abdomen and 12/13/2011 and 12/14/2011.  Findings: Short air fluid levels are again seen and small bowel. Dilated loops of small bowel are again identified measuring up to 5.6 cm.  There has been some increase in dilatation of small bowel loops in the left upper quadrant of the abdomen. No free intraperitoneal air is identified.  IMPRESSION: Mild worsening of small bowel obstruction.  Original Report Authenticated By: Bernadene Bell. D'ALESSIO, M.D.   Dg Abd 2 Views  12/14/2011  *RADIOLOGY REPORT*  Clinical Data: Follow up partial small bowel obstruction.  ABDOMEN - 2 VIEW  Comparison: 12/13/2011  Findings: Two views of the abdomen were obtained.  There is gas in the large and small bowel.  Persistent air fluid levels in the small bowel.  The degree of small bowel distention has not significantly changed.  Stool in the right colon.  No evidence to suggest free air. There may be small pleural effusions.  No acute bony abnormalities.  IMPRESSION: Stable appearance of the dilated small bowel loops.  The  findings remain compatible with a partial small bowel obstruction and minimal change since the prior examination.  Suspect small pleural effusions.  Original Report Authenticated By: Richarda Overlie, M.D.   Dg Abd 2 Views  12/13/2011  *RADIOLOGY REPORT*  Clinical Data: Mid abdominal pain with nausea and vomiting  ABDOMEN - 2 VIEW  Comparison: None.  Findings: There are several mildly dilated loops of small bowel within the left upper quadrant with differential air-fluid levels. The colon contains stool and gas and is not dilated.  No pneumoperitoneum.  No gross organomegaly.  Surgical clips project over the mid lower abdomen.  IMPRESSION: There are several dilated small bowel loops with differential air- fluid levels in the left upper quadrant, worrisome for a partial mechanical small-bowel obstruction.  Please continue to follow closely.  No pneumoperitoneum.  Original Report Authenticated By: Brandon Melnick, M.D.   Dg Abd Portable 1v  12/21/2011  *RADIOLOGY REPORT*  Clinical Data: Abdominal distention, small bowel obstruction  PORTABLE ABDOMEN - 1 VIEW  Comparison: Abdomen films of 12/17/2011 and CT abdomen pelvis of 12/16/2011  Findings: Compared to the scout film from the CT as well as plain films from 01/14, there is somewhat less gaseous distention of the small bowel.  No colonic bowel gas is seen.  IMPRESSION: Improvement in gaseous distention of small bowel.  Original Report Authenticated By: Juline Patch, M.D.   Dg Abd Portable 2v  12/17/2011  *RADIOLOGY REPORT*  Clinical Data: Abdominal pain, possible small bowel obstruction.  PORTABLE ABDOMEN - 2 VIEW  Comparison: 12/17/2011  Findings: NG tube is in stable position within the descending duodenum.  No evidence of bowel obstruction.  Gas within nondistended large and small bowel.  No free air.  No organomegaly.  IMPRESSION: NG tube remains in the descending duodenum.  No evidence of bowel obstruction currently.  Original Report Authenticated By: Cyndie Chime, M.D.   Dg Abd Portable 2v  12/17/2011  *RADIOLOGY REPORT*  Clinical Data: Evaluate small bowel obstruction  PORTABLE ABDOMEN - 2 VIEW  Comparison: 12/16/2011; 12/15/2011; CT abdomen pelvis - 12/16/2011  Findings:  Grossly unchanged mild gaseous distension of several loops of small bowel in the left upper abdomen.  Air is seen within the cecum and ascending colon.  Interval advancement of enteric tube with tip now projecting over the expected location of the descending portion of the duodenum.  No definite pneumo peritoneum, pneumatosis or portal venous gas.  Limited visualization of the lower thorax suggests left basilar heterogeneous opacities, possibly atelectasis. Surgical clips are seen overlying the left mid lower abdomen. Lumbar spine degenerative change.  IMPRESSION: Grossly unchanged findings suggestive of suggestive of at least a partial small bowel obstruction.  Original Report Authenticated By: Waynard Reeds, M.D.   Medications: Scheduled Meds:   . cloNIDine  0.2 mg Oral TID  . diltiazem  30 mg Oral Q6H  . feeding supplement  237 mL Oral BID PC  . insulin aspart  0-5 Units Subcutaneous QHS  . insulin aspart  0-9 Units Subcutaneous TID WC  . labetalol  100 mg Oral BID  . levofloxacin (LEVAQUIN) IV  250 mg Intravenous Q24H  . nicotine  21 mg Transdermal Daily  . nystatin  10 mL Oral QID  . pantoprazole  40 mg Oral Q0600  . vitamin B-12  1,000 mcg Oral Daily   Continuous Infusions:   . sodium chloride 50 mL/hr at 12/27/11 2331   PRN Meds:.acetaminophen, dextrose, hydrALAZINE, HYDROcodone-acetaminophen, morphine injection, ondansetron (ZOFRAN) IV  Assessment/Plan: Patient Active Hospital Problem List: AKI on chronic kidney disease  CT abd and pelvis reveals atrophic rt kidney which is likely from medical-renal disease  Foley inserted by Dr Laverle Patter. Urine output has improved.  slowly Improving renal function. Will continue to monitor.  Malignant HTN  Currently his BP  are running high and will resume his BP medications at a lower dose.  Partial SBO  Improving. Able to pass gas. Last Bm today. repeat Abdominal X ray shows improved sbo  Advance diet.  Hypoxia with low grade fever  Resolved. He is off nasal oxygen and last day of levaquin to complete 7 day course.  DM w/ DKA on presentation : hgba1c is 10.8  CBGs are now well controlled  Continue with SSI.  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 Continuing saline.  Thrush- nystatin has been fiven for 2 days but no improvement has been seen.a dose of Fluconazole was given yesterday and his pain improved. He is not having any trouble with swallowing and therefore I doubt he has candida esophagitis.  Nicotine abuse ; on nicotine patch    LOS: 18 days   [name] 12/28/2011, 7:50 PM

## 2011-12-29 LAB — RENAL FUNCTION PANEL
CO2: 23 mEq/L (ref 19–32)
Calcium: 8.2 mg/dL — ABNORMAL LOW (ref 8.4–10.5)
Creatinine, Ser: 1.88 mg/dL — ABNORMAL HIGH (ref 0.50–1.35)
GFR calc Af Amer: 43 mL/min — ABNORMAL LOW (ref >90)
Glucose, Bld: 186 mg/dL — ABNORMAL HIGH (ref 70–99)

## 2011-12-29 LAB — GLUCOSE, CAPILLARY

## 2011-12-29 MED ORDER — INSULIN GLARGINE 100 UNIT/ML ~~LOC~~ SOLN: SUBCUTANEOUS | Status: DC

## 2011-12-29 MED ORDER — GLUCERNA SHAKE PO LIQD: 237.0000 mL | Freq: Two times a day (BID) | ORAL | Status: DC

## 2011-12-29 MED ORDER — GLIPIZIDE 10 MG PO TABS: 5.0000 mg | ORAL_TABLET | Freq: Two times a day (BID) | ORAL | Status: DC

## 2011-12-29 MED ORDER — PANTOPRAZOLE SODIUM 40 MG PO TBEC: 40.0000 mg | DELAYED_RELEASE_TABLET | Freq: Every day | ORAL | Status: DC

## 2011-12-29 MED ORDER — INSULIN ASPART 100 UNIT/ML ~~LOC~~ SOLN: 0.0000 [IU] | Freq: Three times a day (TID) | SUBCUTANEOUS | Status: DC

## 2011-12-29 MED ORDER — INSULIN GLARGINE 100 UNIT/ML ~~LOC~~ SOLN: 10.0000 [IU] | Freq: Every day | SUBCUTANEOUS | Status: DC

## 2011-12-29 MED ORDER — NICOTINE 21 MG/24HR TD PT24: 1.0000 | MEDICATED_PATCH | Freq: Every day | TRANSDERMAL | Status: AC

## 2011-12-29 NOTE — Discharge Summary (Signed)
DISCHARGE SUMMARY  [name]  MR#: [id number]  DOB:06-Mar-1951  Date of Admission: 12/10/2011 Date of Discharge: 12/29/2011  Attending Physician:Alexza Norbeck  Patient's PCP: Consults:Treatment Team:  Larina Earthly, MD PCCM consult Urology consult Nephrology consult Vascular surgery consult.    Discharge Diagnoses: Present on Admission:  .DKA (diabetic ketoacidoses) Malignant HYPERTENSION Hyperlipidemia Uncontrolled Diabetes Mellitus Gout SBO Abdominal aortic aneurysm with h/o of repair Acute renal failure on CKD. tobacco ABUSE Hyponatremia Presumed Pneumonia. Hematuria.   Medication List  As of 12/29/2011  3:07 PM   STOP taking these medications         lisinopril 20 MG tablet         TAKE these medications         allopurinol 100 MG tablet   Commonly known as: ZYLOPRIM   Take 100 mg by mouth daily.      aspirin EC 81 MG tablet   Take 81 mg by mouth every morning.      cloNIDine 0.2 MG tablet   Commonly known as: CATAPRES   Take 0.2 mg by mouth 3 (three) times daily.      diltiazem 360 MG 24 hr capsule   Commonly known as: TIAZAC   Take 360 mg by mouth daily.      feeding supplement Liqd   Take 237 mLs by mouth 2 (two) times daily after a meal.      glipiZIDE 10 MG tablet   Commonly known as: GLUCOTROL   Take 0.5 tablets (5 mg total) by mouth 2 (two) times daily before a meal.      insulin aspart 100 UNIT/ML injection   Commonly known as: novoLOG   Inject 0-9 Units into the skin 3 (three) times daily with meals.      insulin glargine 100 UNIT/ML injection   Commonly known as: LANTUS   Inject 10 Units into the skin at bedtime.      labetalol 100 MG tablet   Commonly known as: NORMODYNE   Take 100 mg by mouth 2 (two) times daily.      nicotine 21 mg/24hr patch   Commonly known as: NICODERM CQ - dosed in mg/24 hours   Place 1 patch onto the skin daily.      pantoprazole 40 MG tablet   Commonly known as: PROTONIX   Take 1 tablet (40  mg total) by mouth daily at 6 (six) AM.      salsalate 500 MG tablet   Commonly known as: DISALCID   Take 500 mg by mouth 3 (three) times daily.      simvastatin 20 MG tablet   Commonly known as: ZOCOR   Take 20 mg by mouth at bedtime.      vitamin B-12 1000 MCG tablet   Commonly known as: CYANOCOBALAMIN   Take 1,000 mcg by mouth daily.           Brief Admit Note:  61 year-old male with history of diabetes mellitus type 2, history of abdominal aortic aneurysm status post repair, thoracic aneurysm, hypertension previous history of stroke which left the patient with diplopia and gait disorder presented to the ER because of his blood sugar has been increasing with increasing frequency. The patient was found to have very high blood sugars more than 700 with anion gap. Patient has been admitted to the hospitalist service for evaluation and management of DKA.Began complaining of abdominal pain with nausea/vomiting on 1/10 - KUB c/w mid small bowel obstruction. Pt was maintained NPO and  NGT placed. 1/12 had not improved and CCS was consulted and recommended conservative management. CT of the abdomen / pelvis demonstrated mid small bowel obstruction secondary to likely an adhesion in the upper central pelvis. Concurrently, pts BP became difficult to control as he was NPO and required IV Labetalol gtt with tx to ICU for management. PCCM consulted for BP management 1/14. Patient improved and transferred to medial floor but then developed hypotension, hematuria and oliguric renal failure.    Hospital Course:  AKI on chronic kidney disease  CT abd and pelvis reveals atrophic rt kidney which is likely from medical-renal disease  Improving renal function. Discussed with the patient that he needs to follow up his renal function in one week.  Malignant HTN  Resolved. Continue with labetalol, clonidine , diltiazem. Lisinopril held for acute kidney injury.  Partial SBO  Resolved. He is having bowel  movements. He tolerated regular diet without any issues.  Hypoxia with low grade fever  Resolved.He completed the course of antibiotics.  DM w/ DKA on presentation : hgba1c is 10.8  CBGs are now well controlled  Continue with lantus, and sliding scale insulin .  ABDOMINAL AORTIC ANEURYSM w/ hx of repair  Stable  Mild Hyponatremia- resolved with iv fluids.   DM w/ DKA on presentation  CBGs are now well controlled  Nicotine abuse ; on nicotine patch     Day of Discharge BP 154/85  Pulse 68  Temp(Src) 98.1 F (36.7 C) (Oral)  Resp 20  Ht 6\' 3"  (1.905 m)  Wt 67 kg (147 lb 11.3 oz)  BMI 18.46 kg/m2  SpO2 92%  Physical Exam: General: Laying in bed in no acute distress.  HEENT: no pallor, no icterus, no JVD, no lymphadenopathy  Heart: Normal s1 &s2 Regular rate and rhythm, without murmurs, rubs, gallops.  Lungs: Clear to auscultation bilaterally.  Abdomen: Soft, nontender, non-distended, decreased bowel sounds  Extremities: No clubbing cyanosis or edema with positive pedal pulses.  Psych: Alert, awake, oriented x3, nonfocal.   Results for orders placed during the hospital encounter of 12/10/11 (from the past 24 hour(s))  GLUCOSE, CAPILLARY     Status: Abnormal   Collection Time   12/28/11  5:01 PM      Component Value Range   Glucose-Capillary 151 (*) 70 - 99 (mg/dL)  GLUCOSE, CAPILLARY     Status: Abnormal   Collection Time   12/28/11  9:59 PM      Component Value Range   Glucose-Capillary 169 (*) 70 - 99 (mg/dL)   Comment 1 Documented in Chart     Comment 2 Notify RN    RENAL FUNCTION PANEL     Status: Abnormal   Collection Time   12/29/11  5:00 AM      Component Value Range   Sodium 137  135 - 145 (mEq/L)   Potassium 4.0  3.5 - 5.1 (mEq/L)   Chloride 105  96 - 112 (mEq/L)   CO2 23  19 - 32 (mEq/L)   Glucose, Bld 186 (*) 70 - 99 (mg/dL)   BUN 17  6 - 23 (mg/dL)   Creatinine, Ser 0.98 (*) 0.50 - 1.35 (mg/dL)   Calcium 8.2 (*) 8.4 - 10.5 (mg/dL)   Phosphorus 2.4   2.3 - 4.6 (mg/dL)   Albumin 2.2 (*) 3.5 - 5.2 (g/dL)   GFR calc non Af Amer 37 (*) >90 (mL/min)   GFR calc Af Amer 43 (*) >90 (mL/min)  GLUCOSE, CAPILLARY     Status:  Abnormal   Collection Time   12/29/11  7:08 AM      Component Value Range   Glucose-Capillary 173 (*) 70 - 99 (mg/dL)   Comment 1 Documented in Chart     Comment 2 Notify RN    GLUCOSE, CAPILLARY     Status: Abnormal   Collection Time   12/29/11 11:31 AM      Component Value Range   Glucose-Capillary 234 (*) 70 - 99 (mg/dL)    Disposition: Home   Follow-up Appts: Discharge Orders    Future Orders Please Complete By Expires   Diet - low sodium heart healthy      Increase activity slowly      Discharge instructions      Comments:   Follow up with PCP  In one week.        Tests Needing Follow-up: hgba1c and Renal Function Test  Time spent in discharge (includes decision making & examination of pt): 50 minutes  Signed: Jynesis Nakamura 12/29/2011, 3:07 PM

## 2011-12-29 NOTE — Progress Notes (Signed)
Pt discharged home with sister, via wheel chair.  Pt escorted to car by NT.  All pt's personal belongings taken with pt.  Discharge instructions given to pt and sister.  They verbalized understanding.  Rx's given to pt's sister. Pt's Saline Lock removed from pt's left upper arm. No s/s of distress noted.

## 2012-01-23 ENCOUNTER — Ambulatory Visit: Payer: Medicare Other | Admitting: *Deleted

## 2012-02-14 ENCOUNTER — Encounter: Payer: Medicare Other | Attending: Family Medicine | Admitting: *Deleted

## 2012-02-14 ENCOUNTER — Encounter: Payer: Self-pay | Admitting: *Deleted

## 2012-02-14 DIAGNOSIS — Z713 Dietary counseling and surveillance: Secondary | ICD-10-CM | POA: Insufficient documentation

## 2012-02-14 DIAGNOSIS — E119 Type 2 diabetes mellitus without complications: Secondary | ICD-10-CM | POA: Insufficient documentation

## 2012-02-14 NOTE — Patient Instructions (Signed)
Goals:  Follow Diabetes Meal Plan as instructed  Eat 3 meals and snacks if hungry  Limit carbohydrate intake to 75 grams carbohydrate/meal  Limit carbohydrate intake to 30 grams carbohydrate/snack  Add lean protein foods to meals/snacks  Monitor glucose levels as instructed by your doctor  Bring glucose log to your next nutrition visit  Take insulin as directed by MD

## 2012-02-14 NOTE — Progress Notes (Signed)
  Medical Nutrition Therapy:  Appt start time: 1600 end time:  1730.   Assessment:  Primary concerns today: patient here with his sister who he lives with to learn more about insulin action, sliding scale and diabetes education. They live together in the family home and she cares for him in terms of meal preparation and much of his medical care. He states no previous diabetes education other than in the hospital and that he was diagnosed just over a year ago  MEDICATIONS: see list. Diabetes medication includes Lantus insulin @ 10 units at night and Novolog insulin @ 2 units for lunch and 3 units for supper meals   DIETARY INTAKE:  Usual eating pattern includes 3 meals and 0-1 snacks per day.  Everyday foods include good variety of all food groups.  Avoided foods include foods hard to chew.    24-hr recall:  B ( 10-12 AM): Glucerna shake,1 1/2 wheat waffles with SF syrup, raw veggies, vienna sausage or bologna, boiled egg, fruit and prunes with prune jc, small bowl of cheerios with milk,  coffee with cream and Splenda  Snk ( AM): none  L ( 4-5 PM): meat, starch, vegetables, 1/2 toast or wheat bread, SF tea or Sprite Zero Snk ( PM): none D ( 8:30 - 10 PM): 1/2 sandwich, spinach or broccoli, fruit cup, Crystal Light, Glucerna Shake Snk ( PM): occasionally 1/2 bran muffin Beverages: coffee, Crystal Light, Sprite Zero, Glucerna shake  Usual physical activity: has had physical therapy,  Estimated energy needs: 2200 calories 248 g carbohydrates 165 g protein 61 g fat  Progress Towards Goal(s):  In progress.   Nutritional Diagnosis:  NB-1.1 Food and nutrition-related knowledge deficit As related to diabetes.  As evidenced by no previous diabetes education.    Intervention:  Nutrition counseling and diabetes education provided. Discussed basic physiology of diabetes, BG monitoring target ranges, Carb Counting, insulin action of both Lantus and Novolog, symptoms, treatment and prevention of  low BG Goals:  Follow Diabetes Meal Plan as instructed  Eat 3 meals and snacks if hungry  Limit carbohydrate intake to 75 grams carbohydrate/meal  Limit carbohydrate intake to 30 grams carbohydrate/snack  Add lean protein foods to meals/snacks  Monitor glucose levels as instructed by your doctor  Bring glucose log to your next nutrition visit  Take insulin as directed by MD   Handouts given during visit include:  Living Well with Diabetes  Carb Counting handout  Menu Plan Card  Monitoring/Evaluation:  Dietary intake, exercise, consistent carb meals, and body weight in 5 week(s).

## 2012-03-28 ENCOUNTER — Other Ambulatory Visit (HOSPITAL_COMMUNITY): Payer: Self-pay | Admitting: Family Medicine

## 2012-04-08 ENCOUNTER — Ambulatory Visit (HOSPITAL_COMMUNITY): Admission: RE | Admit: 2012-04-08 | Payer: Medicare Other | Source: Ambulatory Visit

## 2012-04-09 ENCOUNTER — Encounter: Payer: Self-pay | Admitting: *Deleted

## 2012-04-09 ENCOUNTER — Encounter: Payer: Medicare Other | Attending: Family Medicine | Admitting: *Deleted

## 2012-04-09 VITALS — Ht 75.0 in | Wt 177.1 lb

## 2012-04-09 DIAGNOSIS — Z713 Dietary counseling and surveillance: Secondary | ICD-10-CM | POA: Insufficient documentation

## 2012-04-09 DIAGNOSIS — E1110 Type 2 diabetes mellitus with ketoacidosis without coma: Secondary | ICD-10-CM

## 2012-04-09 DIAGNOSIS — E1010 Type 1 diabetes mellitus with ketoacidosis without coma: Secondary | ICD-10-CM | POA: Insufficient documentation

## 2012-04-09 NOTE — Patient Instructions (Signed)
Goals:  Follow Diabetes Meal Plan as instructed  Eat 3 meals and snacks if hungry  Limit carbohydrate intake to 60-75 grams carbohydrate/meal  Limit carbohydrate intake to 30 grams carbohydrate/snack  Add lean protein foods to meals/snacks  Monitor glucose levels as instructed by your doctor  Bring glucose log to your next nutrition visit  Take insulin as directed by MD

## 2012-04-09 NOTE — Progress Notes (Signed)
  Medical Nutrition Therapy:  Appt start time: 1300 end time:  1330.  Assessment:  Primary concerns today: patient here for follow up visit with his sister who he lives with to assess his progress with diabetes. She continues to care for him in terms of meal preparation and much of his medical care. She brought his BG Log book and states he is eating well and they are content with diabetes management at this time.  MEDICATIONS: see list. Diabetes medication includes Lantus insulin @ 8 units at night and Novolog insulin @ 3 units for dinner at 4PM and 3 units for supper meals @ 9PM   DIETARY INTAKE:  Usual eating pattern includes 3 meals and 0-1 snacks per day.  Everyday foods include good variety of all food groups.  Avoided foods include foods hard to chew.    24-hr recall:  B ( 10-12 AM): 1 wheat waffles with SF syrup, raw veggies, vienna sausage or bologna, boiled egg, fruit and prunes with prune jc, small bowl of cheerios with milk,  coffee with cream and Splenda  Snk ( AM): none  L ( 4-5 PM): meat, starch, vegetables, 1/2 sandwich toast or wheat bread, and 3 sandwich crackers, SF tea or Sprite Zero Snk ( PM): none D ( 8:30 - 10 PM): 1/2 sandwich, spinach or broccoli, fruit cup, Crystal Light, Glucerna Shake Snk ( PM): occasionally 1/2 bran muffin Beverages: coffee, Crystal Light, Sprite Zero, Glucerna shake  Usual physical activity:none at this time  BG per Log Book are between 98 and 150 pre breakfast and dinner, higher before supper, between 150 and 250 mg/dl  Estimated energy needs: 2200 calories 248 g carbohydrates 165 g protein 61 g fat  Progress Towards Goal(s):  In progress.   Nutritional Diagnosis:  NB-1.1 Food and nutrition-related knowledge deficit As related to diabetes.  As evidenced by no previous diabetes education.    Intervention:  Nutrition counseling and diabetes education reviewed. Sister is doing well with  BG monitoring, Carb Counting, and expresses good  understanding of insulin action of both Lantus and Novolog as well as symptoms, treatment and prevention of low BG Goals:  Continue to follow Diabetes Meal Plan as instructed  Eat 3 meals and snacks if hungry  Limit carbohydrate intake to 75 grams carbohydrate/meal  Limit carbohydrate intake to 30 grams carbohydrate/snack  Add lean protein foods to meals/snacks  Monitor glucose levels as instructed by your doctor  Bring glucose log to your next nutrition visit  Take insulin as directed by MD  Monitoring/Evaluation:  Dietary intake, exercise, consistent carb meals, and body weight as needed.

## 2012-04-16 ENCOUNTER — Ambulatory Visit (HOSPITAL_COMMUNITY)
Admission: RE | Admit: 2012-04-16 | Discharge: 2012-04-16 | Disposition: A | Discharge status: Home / Self Care | Payer: Medicare Other | Source: Ambulatory Visit | Attending: Family Medicine | Admitting: Family Medicine

## 2012-04-16 DIAGNOSIS — E785 Hyperlipidemia, unspecified: Secondary | ICD-10-CM | POA: Insufficient documentation

## 2012-04-16 DIAGNOSIS — I359 Nonrheumatic aortic valve disorder, unspecified: Secondary | ICD-10-CM

## 2012-04-16 DIAGNOSIS — I10 Essential (primary) hypertension: Secondary | ICD-10-CM | POA: Insufficient documentation

## 2012-04-16 DIAGNOSIS — R609 Edema, unspecified: Secondary | ICD-10-CM | POA: Insufficient documentation

## 2012-04-16 NOTE — Progress Notes (Signed)
*  PRELIMINARY RESULTS* Echocardiogram 2D Echocardiogram has been performed.  [name] 04/16/2012, 3:37 PM

## 2012-07-09 ENCOUNTER — Other Ambulatory Visit: Payer: Self-pay | Admitting: Cardiothoracic Surgery

## 2012-07-09 DIAGNOSIS — D381 Neoplasm of uncertain behavior of trachea, bronchus and lung: Secondary | ICD-10-CM

## 2012-08-26 ENCOUNTER — Other Ambulatory Visit: Payer: Self-pay | Admitting: Cardiothoracic Surgery

## 2012-08-26 DIAGNOSIS — I712 Thoracic aortic aneurysm, without rupture: Secondary | ICD-10-CM

## 2012-08-27 ENCOUNTER — Encounter: Payer: Self-pay | Admitting: Cardiothoracic Surgery

## 2012-08-27 ENCOUNTER — Other Ambulatory Visit: Payer: Self-pay | Admitting: Cardiothoracic Surgery

## 2012-08-27 ENCOUNTER — Ambulatory Visit
Admission: RE | Admit: 2012-08-27 | Discharge: 2012-08-27 | Disposition: A | Discharge status: Home / Self Care | Payer: Medicare Other | Source: Ambulatory Visit | Attending: Cardiothoracic Surgery | Admitting: Cardiothoracic Surgery

## 2012-08-27 ENCOUNTER — Ambulatory Visit (INDEPENDENT_AMBULATORY_CARE_PROVIDER_SITE_OTHER): Payer: Medicare Other | Admitting: Cardiothoracic Surgery

## 2012-08-27 VITALS — BP 111/64 | HR 40 | Resp 20 | Ht 75.0 in | Wt 176.0 lb

## 2012-08-27 DIAGNOSIS — I712 Thoracic aortic aneurysm, without rupture: Secondary | ICD-10-CM

## 2012-08-27 DIAGNOSIS — D381 Neoplasm of uncertain behavior of trachea, bronchus and lung: Secondary | ICD-10-CM

## 2012-08-27 DIAGNOSIS — J449 Chronic obstructive pulmonary disease, unspecified: Secondary | ICD-10-CM

## 2012-08-27 DIAGNOSIS — I10 Essential (primary) hypertension: Secondary | ICD-10-CM

## 2012-08-27 LAB — BUN: BUN: 45 mg/dL — ABNORMAL HIGH (ref 6–23)

## 2012-08-27 LAB — CREATININE, SERUM: Creat: 3 mg/dL — ABNORMAL HIGH (ref 0.50–1.35)

## 2012-08-27 NOTE — Progress Notes (Signed)
PCP is Norberto Sorenson, MD Referring Provider is Norberto Sorenson, MD  Chief Complaint  Patient presents with  . Thoracic Aortic Aneurysm    1 year f/u with Chest CT, surveillance of aortic aneurysm    HPI: The patient returns for one year followup with CT scan for a 5 cm fusiform aneurysm of the distal arch and proximal descending thoracic aneurysm, asymptomatic. Since his last visit the patient is stop smoking which is fortunate since he has severe emphysema. The patient had a significant stroke in 2009. He has hypertension and diabetes.  The scan today shows his atherosclerotic changes of the distal arch and proximal descending thoracic aorta are unchanged. The diameter remains approximately 5 cm without hematoma or ulcer. There is no contrast used since the creatinine is 3.0.   Past Medical History  Diagnosis Date  . Diabetes mellitus   . Hypertension   . Aneurysm of aorta   . Aneurysm of abdominal aorta   . Stroke     CVA effected  eye  & leg  . Cataract, bilateral   . Renal disorder     Past Surgical History  Procedure Date  . Abdominal aortic aneurysm repair   . Hernia repair     Family History  Problem Relation Age of Onset  . Cancer Mother   . Diabetes Mother   . Heart failure Father   . Stroke Father     Social History History  Substance Use Topics  . Smoking status: Former Smoker -- 1.0 packs/day    Quit date: 01/02/2012  . Smokeless tobacco: Never Used  . Alcohol Use: No     not since in hospital in January, used to have a beer occasionally    Current Outpatient Prescriptions  Medication Sig Dispense Refill  . allopurinol (ZYLOPRIM) 100 MG tablet Take 100 mg by mouth daily.        Marland Kitchen aspirin EC 81 MG tablet Take 81 mg by mouth every morning.        . cloNIDine (CATAPRES) 0.2 MG tablet Take 0.3 mg by mouth 3 (three) times daily.       . colchicine 0.6 MG tablet Take 0.6 mg by mouth daily.      Marland Kitchen diltiazem (TIAZAC) 360 MG 24 hr capsule Take 360 mg by mouth daily.         . feeding supplement (GLUCERNA SHAKE) LIQD Take 237 mLs by mouth 2 (two) times daily after a meal.  60 Can  2  . glipiZIDE (GLUCOTROL) 10 MG tablet Take 0.5 tablets (5 mg total) by mouth 2 (two) times daily before a meal.  30 tablet  0  . insulin aspart (NOVOLOG) 100 UNIT/ML injection Inject 3 Units into the skin 2 (two) times daily before lunch and supper.      . insulin glargine (LANTUS) 100 UNIT/ML injection Inject 8 Units into the skin at bedtime. Please provide the patient Insulin Pens, Each pen containing 10 units of lantus to be given daily.      Marland Kitchen labetalol (NORMODYNE) 100 MG tablet Take 100 mg by mouth 2 (two) times daily.        Marland Kitchen lisinopril (PRINIVIL,ZESTRIL) 40 MG tablet Take 40 mg by mouth daily.       . pantoprazole (PROTONIX) 40 MG tablet Take 1 tablet (40 mg total) by mouth daily at 6 (six) AM.  30 tablet  0  . salsalate (DISALCID) 500 MG tablet Take 500 mg by mouth 3 (three) times daily.       Marland Kitchen  simvastatin (ZOCOR) 20 MG tablet Take 20 mg by mouth at bedtime.        . Tamsulosin HCl (FLOMAX) 0.4 MG CAPS Take 0.4 mg by mouth daily.       . vitamin B-12 (CYANOCOBALAMIN) 1000 MCG tablet Take 1,000 mcg by mouth daily.        Marland Kitchen DISCONTD: insulin aspart (NOVOLOG) 100 UNIT/ML injection Inject 0-9 Units into the skin 3 (three) times daily with meals.  1 vial  0    No Known Allergies  Review of Systems improved appetite and weight since stopping smoking no chest or back pain  BP 111/64  Pulse 40  Resp 20  Ht 6\' 3"  (1.905 m)  Wt 176 lb (79.833 kg)  BMI 22.00 kg/m2  SpO2 95% Physical Exam Chronically ill old previous stroke Breath sounds distant scattered rhonchi Neck with good pulses no JVD Cardiac regular rhythm without murmur Abdomen soft well-healed scar from previous AAA resection Nonpalpable pedal pulses Neuro right-sided weakness  Diagnostic Tests: CT scan of the chest as noted above. Study done without contrast the tube creatinine 3.0  Impression: Diffuse  enlargement of the arch descending aorta, stable for the past year and a half. Patient is extremely poor candidate for surgical repair. His best option is risk factor management including blood pressure control, nonsmoking, and statin use to maintain integrity of blood vessels  Plan: Return for followup CT scan in one year continue medical therapy patient not a surgical candidate for stent grafting due to elevated creatinine or open repair do to severe COPD

## 2012-09-24 ENCOUNTER — Encounter: Payer: Self-pay | Admitting: Internal Medicine

## 2012-11-07 ENCOUNTER — Ambulatory Visit (AMBULATORY_SURGERY_CENTER): Payer: Medicare Other | Admitting: *Deleted

## 2012-11-07 VITALS — Ht 74.0 in | Wt 179.0 lb

## 2012-11-07 DIAGNOSIS — Z1211 Encounter for screening for malignant neoplasm of colon: Secondary | ICD-10-CM

## 2012-11-07 MED ORDER — SUPREP BOWEL PREP KIT 17.5-3.13-1.6 GM/177ML PO SOLN: ORAL | Status: DC

## 2012-11-11 ENCOUNTER — Encounter: Payer: Self-pay | Admitting: Gastroenterology

## 2012-11-11 ENCOUNTER — Telehealth: Payer: Self-pay | Admitting: Internal Medicine

## 2012-11-11 NOTE — Telephone Encounter (Signed)
Pt's sister c/b says they were out at the time of the call

## 2012-11-11 NOTE — Telephone Encounter (Signed)
I spoke with the patient's sister.  She understands that procedure is cancelled due to comorbidity.  She will call back if the patient needs GI care.

## 2012-11-11 NOTE — Telephone Encounter (Signed)
Error

## 2012-11-11 NOTE — Telephone Encounter (Signed)
Chart reviewed and discussed with Dr. Leone Payor the patient is not a candidate for screening colonoscopy due to thoracic aneurysm, past CVA, and severe COPD.   Phone is busy, I will continue to try and reach the patient and family

## 2012-11-18 ENCOUNTER — Encounter: Payer: Medicare Other | Admitting: Internal Medicine

## 2012-12-21 ENCOUNTER — Encounter (HOSPITAL_COMMUNITY): Payer: Self-pay | Admitting: Family Medicine

## 2012-12-21 ENCOUNTER — Emergency Department (HOSPITAL_COMMUNITY)
Admission: EM | Admit: 2012-12-21 | Discharge: 2012-12-21 | Disposition: A | Discharge status: Home / Self Care | Payer: Medicare Other | Attending: Emergency Medicine | Admitting: Emergency Medicine

## 2012-12-21 DIAGNOSIS — Z7982 Long term (current) use of aspirin: Secondary | ICD-10-CM | POA: Insufficient documentation

## 2012-12-21 DIAGNOSIS — Z87448 Personal history of other diseases of urinary system: Secondary | ICD-10-CM | POA: Insufficient documentation

## 2012-12-21 DIAGNOSIS — Z794 Long term (current) use of insulin: Secondary | ICD-10-CM | POA: Insufficient documentation

## 2012-12-21 DIAGNOSIS — E785 Hyperlipidemia, unspecified: Secondary | ICD-10-CM | POA: Insufficient documentation

## 2012-12-21 DIAGNOSIS — Z8679 Personal history of other diseases of the circulatory system: Secondary | ICD-10-CM | POA: Insufficient documentation

## 2012-12-21 DIAGNOSIS — Z79899 Other long term (current) drug therapy: Secondary | ICD-10-CM | POA: Insufficient documentation

## 2012-12-21 DIAGNOSIS — Z8669 Personal history of other diseases of the nervous system and sense organs: Secondary | ICD-10-CM | POA: Insufficient documentation

## 2012-12-21 DIAGNOSIS — K219 Gastro-esophageal reflux disease without esophagitis: Secondary | ICD-10-CM | POA: Insufficient documentation

## 2012-12-21 DIAGNOSIS — Z87891 Personal history of nicotine dependence: Secondary | ICD-10-CM | POA: Insufficient documentation

## 2012-12-21 DIAGNOSIS — E119 Type 2 diabetes mellitus without complications: Secondary | ICD-10-CM

## 2012-12-21 DIAGNOSIS — Z8673 Personal history of transient ischemic attack (TIA), and cerebral infarction without residual deficits: Secondary | ICD-10-CM | POA: Insufficient documentation

## 2012-12-21 DIAGNOSIS — I10 Essential (primary) hypertension: Secondary | ICD-10-CM | POA: Insufficient documentation

## 2012-12-21 DIAGNOSIS — Z8709 Personal history of other diseases of the respiratory system: Secondary | ICD-10-CM | POA: Insufficient documentation

## 2012-12-21 LAB — URINALYSIS, ROUTINE W REFLEX MICROSCOPIC
Glucose, UA: NEGATIVE mg/dL
Leukocytes, UA: NEGATIVE
Nitrite: NEGATIVE
Specific Gravity, Urine: 1.016 (ref 1.005–1.030)
pH: 6 (ref 5.0–8.0)

## 2012-12-21 LAB — COMPREHENSIVE METABOLIC PANEL
ALT: 9 U/L (ref 0–53)
BUN: 29 mg/dL — ABNORMAL HIGH (ref 6–23)
CO2: 20 mEq/L (ref 19–32)
Calcium: 9.4 mg/dL (ref 8.4–10.5)
GFR calc Af Amer: 28 mL/min — ABNORMAL LOW (ref >90)
GFR calc non Af Amer: 24 mL/min — ABNORMAL LOW (ref >90)
Glucose, Bld: 162 mg/dL — ABNORMAL HIGH (ref 70–99)
Sodium: 135 mEq/L (ref 135–145)

## 2012-12-21 LAB — CBC WITH DIFFERENTIAL/PLATELET
Eosinophils Absolute: 0.2 10*3/uL (ref 0.0–0.7)
Eosinophils Relative: 3 % (ref 0–5)
HCT: 35.3 % — ABNORMAL LOW (ref 39.0–52.0)
Hemoglobin: 12.2 g/dL — ABNORMAL LOW (ref 13.0–17.0)
Lymphocytes Relative: 25 % (ref 12–46)
Lymphs Abs: 2.2 10*3/uL (ref 0.7–4.0)
MCH: 28.8 pg (ref 26.0–34.0)
MCV: 83.5 fL (ref 78.0–100.0)
Monocytes Absolute: 0.7 10*3/uL (ref 0.1–1.0)
Monocytes Relative: 7 % (ref 3–12)
RBC: 4.23 MIL/uL (ref 4.22–5.81)
WBC: 9 10*3/uL (ref 4.0–10.5)

## 2012-12-21 LAB — GLUCOSE, CAPILLARY: Glucose-Capillary: 155 mg/dL — ABNORMAL HIGH (ref 70–99)

## 2012-12-21 LAB — URINE MICROSCOPIC-ADD ON

## 2012-12-21 LAB — POCT I-STAT, CHEM 8
BUN: 29 mg/dL — ABNORMAL HIGH (ref 6–23)
Chloride: 108 mEq/L (ref 96–112)
HCT: 36 % — ABNORMAL LOW (ref 39.0–52.0)
Potassium: 4.2 mEq/L (ref 3.5–5.1)
Sodium: 140 mEq/L (ref 135–145)

## 2012-12-21 NOTE — ED Provider Notes (Signed)
History     CSN: [id number]  Arrival date & time 12/21/12  1331   First MD Initiated Contact with Patient 12/21/12 1349      Chief Complaint  Patient presents with  . Hyperglycemia     HPI Per pt sts CBG was high this am and he did not take insulin because it was too high. sts 501.  Patient has no symptoms.  Past Medical History  Diagnosis Date  . Diabetes mellitus   . Hypertension   . Aneurysm of aorta   . Aneurysm of abdominal aorta   . Cataract, bilateral   . Renal disorder   . Stroke     CVA effected R eye  &R. leg  . Emphysema of lung   . GERD (gastroesophageal reflux disease)   . Hyperlipidemia   . Thoracic aortic aneurysm current    Past Surgical History  Procedure Date  . Abdominal aortic aneurysm repair 2006  . Hernia repair 2005    R inguinal  . Appendectomy 1954    Family History  Problem Relation Age of Onset  . Cancer Mother   . Diabetes Mother   . Heart failure Father   . Stroke Father     History  Substance Use Topics  . Smoking status: Former Smoker -- 1.0 packs/day    Quit date: 01/02/2012  . Smokeless tobacco: Never Used  . Alcohol Use: No     Comment: not since in hospital in January, used to have a beer occasionally      Review of Systems All other systems reviewed and are negative Allergies  Review of patient's allergies indicates no known allergies.  Home Medications   Current Outpatient Rx  Name  Route  Sig  Dispense  Refill  . ALLOPURINOL 100 MG PO TABS   Oral   Take 100 mg by mouth daily.           . ASPIRIN EC 81 MG PO TBEC   Oral   Take 81 mg by mouth every morning.           . ATORVASTATIN CALCIUM 40 MG PO TABS   Oral   Take 40 mg by mouth daily.         Marland Kitchen CLONIDINE HCL 0.3 MG PO TABS   Oral   Take 0.3 mg by mouth 3 (three) times daily.         . COLCHICINE 0.6 MG PO TABS   Oral   Take 0.6 mg by mouth as directed. With sign of gout attack / doctors direction         . DILTIAZEM HCL ER BEADS  360 MG PO CP24   Oral   Take 360 mg by mouth daily.           Marland Kitchen GLUCERNA SHAKE PO LIQD   Oral   Take 237 mLs by mouth 2 (two) times daily after a meal.   60 Can   2   . GLIPIZIDE 5 MG PO TABS   Oral   Take 5 mg by mouth 2 (two) times daily before a meal.         . INSULIN ASPART 100 UNIT/ML [place] SOLN   Subcutaneous   Inject 3 Units into the skin 2 (two) times daily before lunch and supper.         . INSULIN GLARGINE 100 UNIT/ML [place] SOLN   Subcutaneous   Inject 8 Units into the skin at bedtime. Please provide the patient Insulin  Pens, Each pen containing 10 units of lantus to be given daily.         Marland Kitchen LABETALOL HCL 200 MG PO TABS   Oral   Take 200 mg by mouth 2 (two) times daily.         Marland Kitchen PANTOPRAZOLE SODIUM 40 MG PO TBEC   Oral   Take 1 tablet (40 mg total) by mouth daily at 6 (six) AM.   30 tablet   0   . TAMSULOSIN HCL 0.4 MG PO CAPS   Oral   Take 0.4 mg by mouth daily after supper.          Marland Kitchen VITAMIN B-12 250 MCG PO TABS   Oral   Take 250 mcg by mouth daily.           BP 192/95  Pulse 66  Temp 98 F (36.7 C)  Resp 18  SpO2 99%  Physical Exam  Nursing note and vitals reviewed. Constitutional: He is oriented to person, place, and time. He appears well-developed and well-nourished. No distress.  HENT:  Head: Normocephalic and atraumatic.  Eyes: Pupils are equal, round, and reactive to light.  Neck: Normal range of motion.  Cardiovascular: Normal rate and intact distal pulses.   Pulmonary/Chest: No respiratory distress.  Abdominal: Normal appearance. He exhibits no distension.  Musculoskeletal: Normal range of motion.  Neurological: He is alert and oriented to person, place, and time. No cranial nerve deficit.  Skin: Skin is warm and dry. No rash noted.  Psychiatric: He has a normal mood and affect. His behavior is normal.    ED Course  Procedures (including critical care time)  Labs Reviewed  CBC WITH DIFFERENTIAL - Abnormal; Notable  for the following:    Hemoglobin 12.2 (*)     HCT 35.3 (*)     RDW 16.6 (*)     Platelets 126 (*)  PLATELET COUNT CONFIRMED BY SMEAR   All other components within normal limits  COMPREHENSIVE METABOLIC PANEL - Abnormal; Notable for the following:    Glucose, Bld 162 (*)     BUN 29 (*)     Creatinine, Ser 2.63 (*)     GFR calc non Af Amer 24 (*)     GFR calc Af Amer 28 (*)     All other components within normal limits  GLUCOSE, CAPILLARY - Abnormal; Notable for the following:    Glucose-Capillary 155 (*)     All other components within normal limits  URINALYSIS, ROUTINE W REFLEX MICROSCOPIC - Abnormal; Notable for the following:    Hgb urine dipstick MODERATE (*)     Protein, ur >300 (*)     All other components within normal limits  POCT I-STAT, CHEM 8 - Abnormal; Notable for the following:    BUN 29 (*)     Creatinine, Ser 2.50 (*)     Glucose, Bld 141 (*)     Hemoglobin 12.2 (*)     HCT 36.0 (*)     All other components within normal limits  URINE MICROSCOPIC-ADD ON - Abnormal; Notable for the following:    Casts HYALINE CASTS (*)     All other components within normal limits   No results found.   1. Diabetes mellitus       MDM  Patient's blood sugar on random CBG when patient arrived in the department was 155.  I discussed with patient possibility that there need or may not be reading correctly.  All the labs appear  normal there is no anion gap and no ketones in his urine.  Patient says to go home.        Nelia Shi, MD 12/21/12 [phone]

## 2012-12-21 NOTE — ED Notes (Signed)
Pt presents to department for evaluation of hyperglycemia. Pt states his blood sugar was above 500 this morning. States he did not take insulin today. Denies weakness. Pt is alert and oriented x4. Has no other complaints.

## 2012-12-21 NOTE — ED Notes (Signed)
Per pt sts CBG was high this am and he did not take insulin because it was too high. sts 501.

## 2013-05-01 IMAGING — CT CT HEAD W/O CM
1 of 2 series · 16 of 30 positions shown, 20 images · non-contrast
Comparison: 02/02/2008.

CLINICAL DATA: Difficulty swallowing.  Hyperglycemia and
hypertension with weakness for 12 days.

CT HEAD WITHOUT CONTRAST
TECHNIQUE: Contiguous axial images were obtained from the base of
the skull through the vertex without contrast.

[Series 2: head routine 4.8 h37s · axial · 0.43mm/px · z∈[+1060,+1186]mm · 16 of 30 slices shown, 20 images]
[im 2/30  brain]
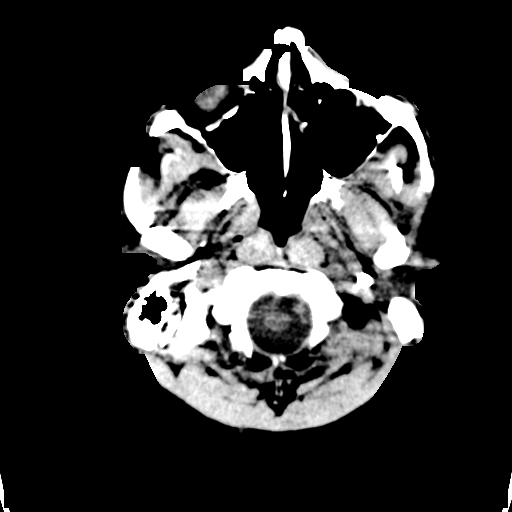
[im 2/30  bone]
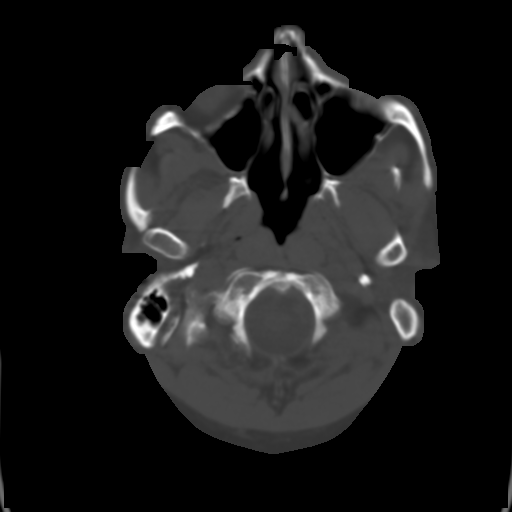
[im 4/30  brain]
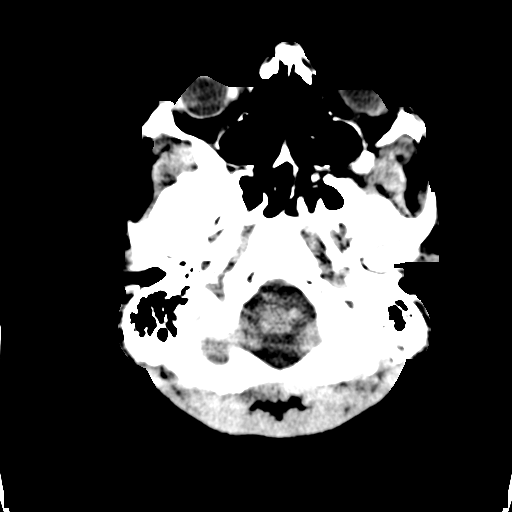
[im 5/30  brain]
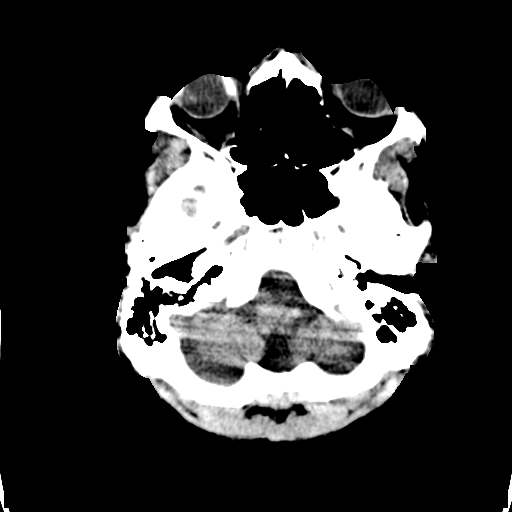
[im 7/30  brain]
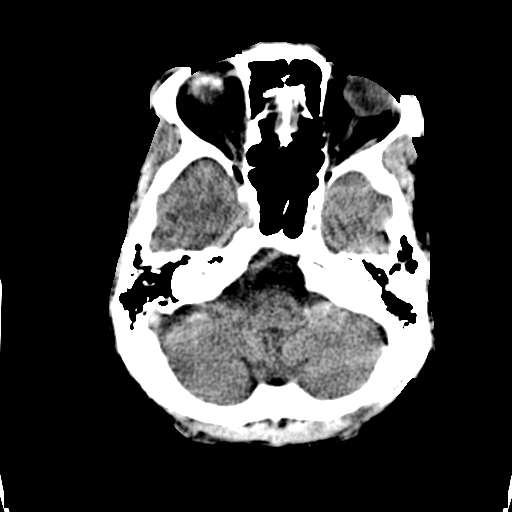
[im 9/30  brain]
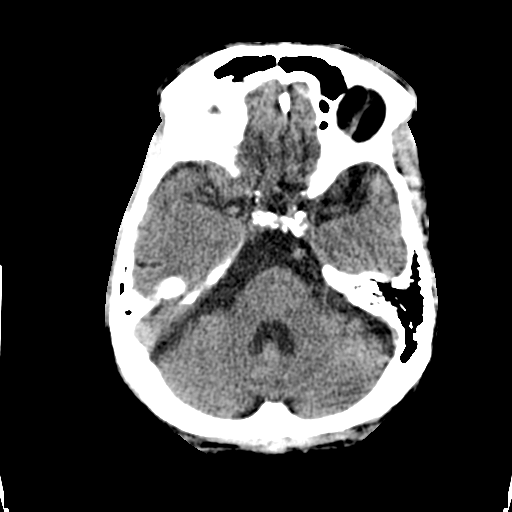
[im 9/30  bone]
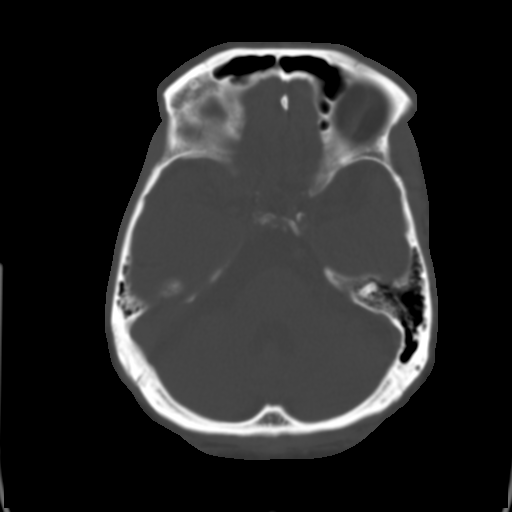
[im 10/30  brain]
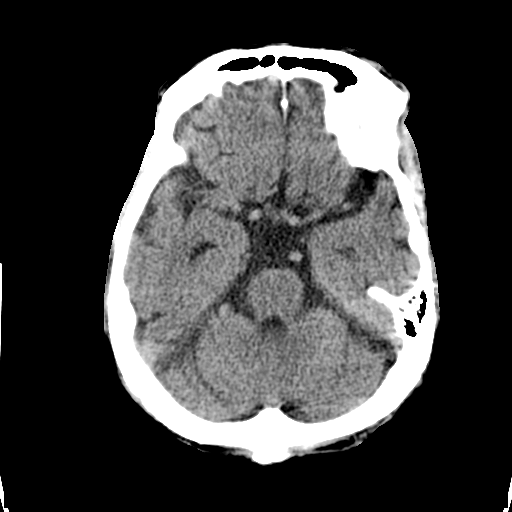
[im 12/30  brain]
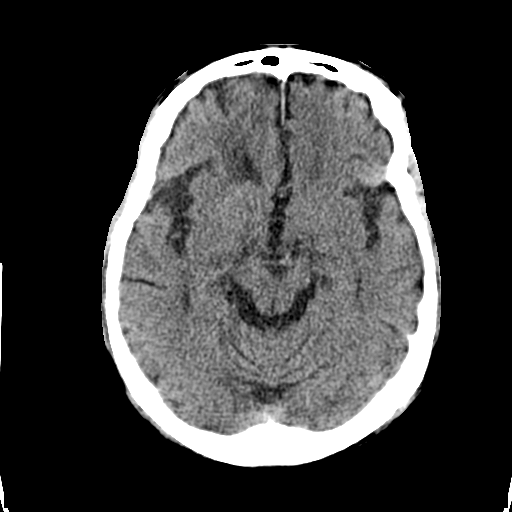
[im 13/30  brain]
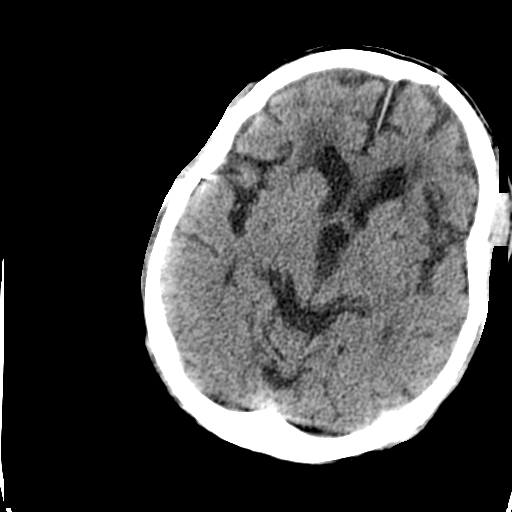
[im 17/30  brain]
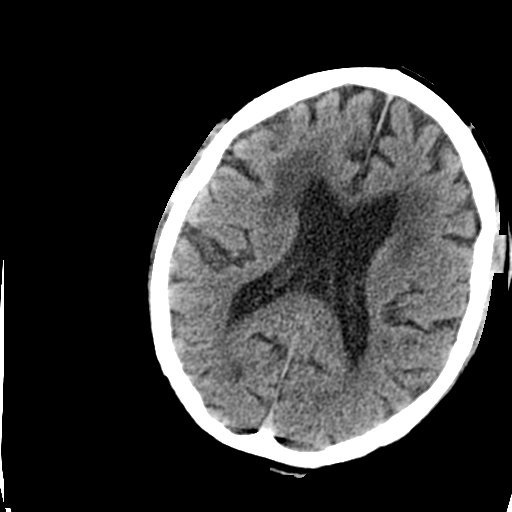
[im 17/30  bone]
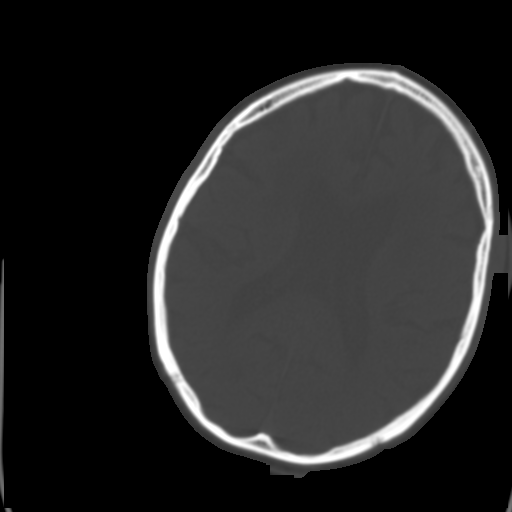
[im 18/30  brain]
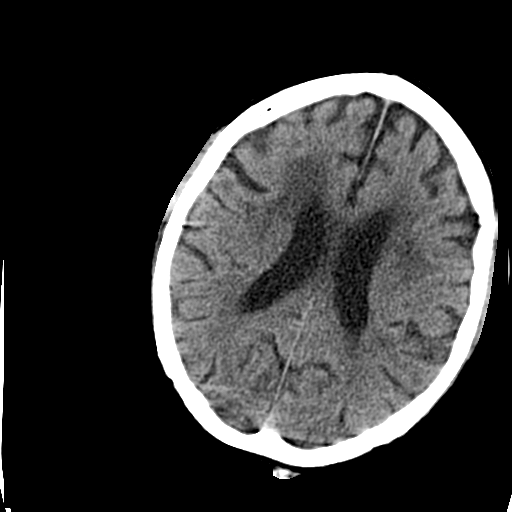
[im 20/30  brain]
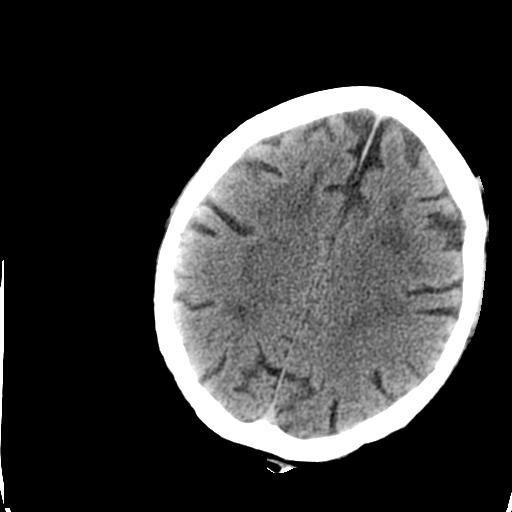
[im 21/30  brain]
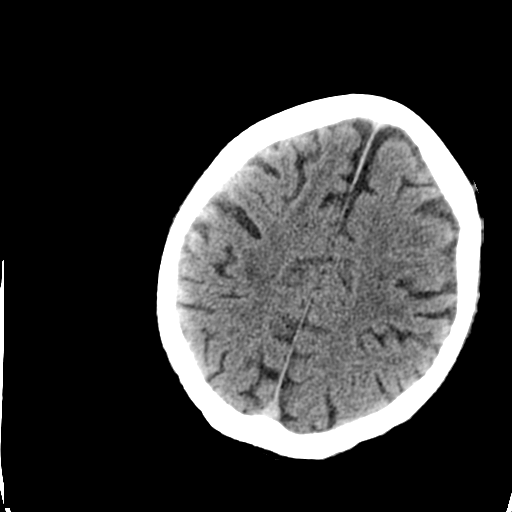
[im 23/30  brain]
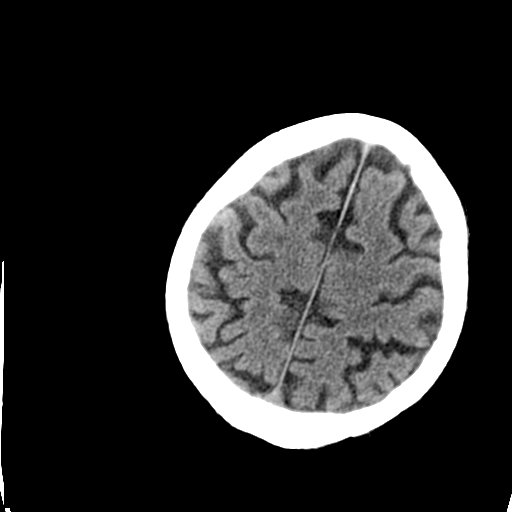
[im 23/30  bone]
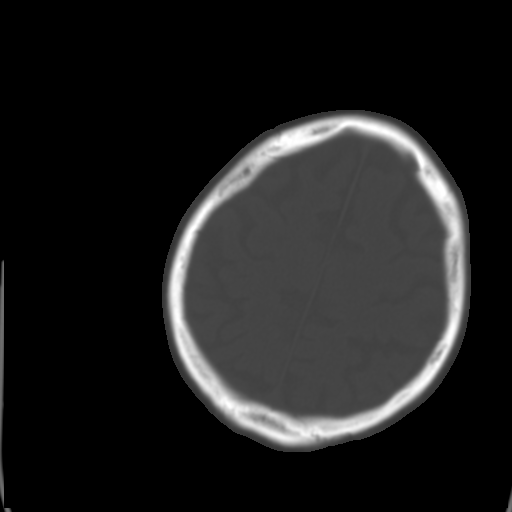
[im 25/30  brain]
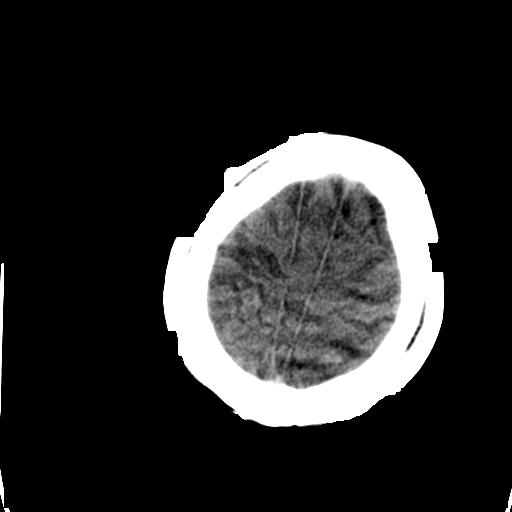
[im 26/30  brain]
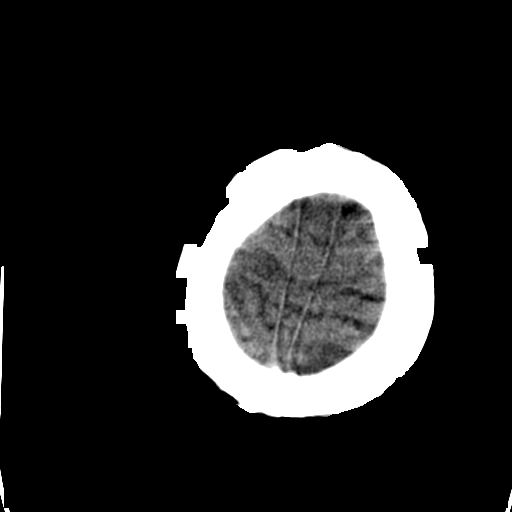
[im 28/30  brain]
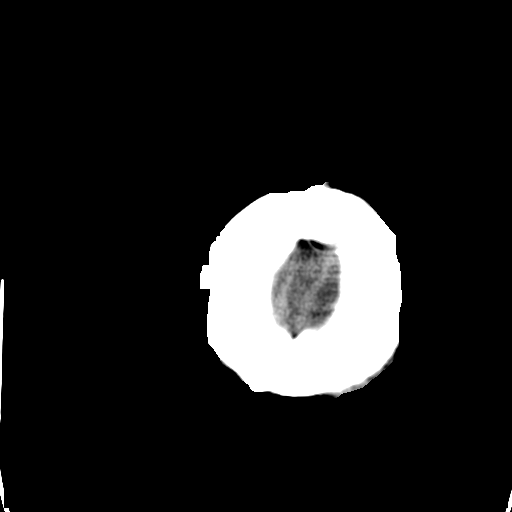

[16 of 30 positions shown; findings below may reference images not displayed]

FINDINGS: No mass lesion, mass effect, midline shift,
hydrocephalus, hemorrhage.  No acute territorial cortical
ischemia/infarct. Atrophy and chronic ischemic white matter disease
is present.  Atrophy of the left cerebral peduncle is present
associated with prior infarct.  Intracranial atherosclerosis.  The
study is mildly degraded by motion artifact.  Slices were repeated.
Calvarium appears intact.  Paranasal sinuses are within normal
limits. Unchanged small bilateral basal ganglia lacunar infarcts.
IMPRESSION: No acute intracranial abnormality.  Atrophy and chronic ischemic
white matter disease with bilateral basal ganglia lacunar infarcts.

## 2013-07-06 ENCOUNTER — Other Ambulatory Visit (HOSPITAL_COMMUNITY): Payer: Self-pay | Admitting: *Deleted

## 2013-07-07 ENCOUNTER — Ambulatory Visit (HOSPITAL_COMMUNITY)
Admission: RE | Admit: 2013-07-07 | Discharge: 2013-07-07 | Disposition: A | Discharge status: Home / Self Care | Payer: Medicare Other | Source: Ambulatory Visit | Attending: Nephrology | Admitting: Nephrology

## 2013-07-07 DIAGNOSIS — D571 Sickle-cell disease without crisis: Secondary | ICD-10-CM | POA: Insufficient documentation

## 2013-07-07 MED ORDER — SODIUM CHLORIDE 0.9 % IV SOLN
1020.0000 mg | Freq: Once | INTRAVENOUS | Status: AC
  Administered 2013-07-07: 1020 mg via INTRAVENOUS
  Filled 2013-07-07 (×2): qty 34

## 2013-07-09 ENCOUNTER — Other Ambulatory Visit: Payer: Self-pay

## 2013-07-09 ENCOUNTER — Encounter: Payer: Self-pay | Admitting: Vascular Surgery

## 2013-07-09 DIAGNOSIS — N186 End stage renal disease: Secondary | ICD-10-CM

## 2013-07-09 DIAGNOSIS — Z01810 Encounter for preprocedural cardiovascular examination: Secondary | ICD-10-CM

## 2013-07-18 ENCOUNTER — Encounter (HOSPITAL_COMMUNITY): Payer: Self-pay | Admitting: *Deleted

## 2013-07-18 DIAGNOSIS — E785 Hyperlipidemia, unspecified: Secondary | ICD-10-CM | POA: Insufficient documentation

## 2013-07-18 DIAGNOSIS — Z794 Long term (current) use of insulin: Secondary | ICD-10-CM | POA: Insufficient documentation

## 2013-07-18 DIAGNOSIS — Z8673 Personal history of transient ischemic attack (TIA), and cerebral infarction without residual deficits: Secondary | ICD-10-CM | POA: Insufficient documentation

## 2013-07-18 DIAGNOSIS — Z87891 Personal history of nicotine dependence: Secondary | ICD-10-CM | POA: Insufficient documentation

## 2013-07-18 DIAGNOSIS — H269 Unspecified cataract: Secondary | ICD-10-CM | POA: Insufficient documentation

## 2013-07-18 DIAGNOSIS — E119 Type 2 diabetes mellitus without complications: Secondary | ICD-10-CM | POA: Insufficient documentation

## 2013-07-18 DIAGNOSIS — Z8709 Personal history of other diseases of the respiratory system: Secondary | ICD-10-CM | POA: Insufficient documentation

## 2013-07-18 DIAGNOSIS — Z8719 Personal history of other diseases of the digestive system: Secondary | ICD-10-CM | POA: Insufficient documentation

## 2013-07-18 DIAGNOSIS — N189 Chronic kidney disease, unspecified: Secondary | ICD-10-CM | POA: Insufficient documentation

## 2013-07-18 DIAGNOSIS — Z8679 Personal history of other diseases of the circulatory system: Secondary | ICD-10-CM | POA: Insufficient documentation

## 2013-07-18 DIAGNOSIS — Z7982 Long term (current) use of aspirin: Secondary | ICD-10-CM | POA: Insufficient documentation

## 2013-07-18 DIAGNOSIS — Z79899 Other long term (current) drug therapy: Secondary | ICD-10-CM | POA: Insufficient documentation

## 2013-07-18 DIAGNOSIS — M542 Cervicalgia: Secondary | ICD-10-CM | POA: Insufficient documentation

## 2013-07-18 DIAGNOSIS — I129 Hypertensive chronic kidney disease with stage 1 through stage 4 chronic kidney disease, or unspecified chronic kidney disease: Secondary | ICD-10-CM | POA: Insufficient documentation

## 2013-07-18 DIAGNOSIS — J438 Other emphysema: Secondary | ICD-10-CM | POA: Insufficient documentation

## 2013-07-18 LAB — BASIC METABOLIC PANEL
CO2: 22 mEq/L (ref 19–32)
Calcium: 9 mg/dL (ref 8.4–10.5)
GFR calc non Af Amer: 11 mL/min — ABNORMAL LOW (ref >90)
Glucose, Bld: 69 mg/dL — ABNORMAL LOW (ref 70–99)
Potassium: 4 mEq/L (ref 3.5–5.1)
Sodium: 137 mEq/L (ref 135–145)

## 2013-07-18 LAB — CBC
Hemoglobin: 12 g/dL — ABNORMAL LOW (ref 13.0–17.0)
MCH: 29.2 pg (ref 26.0–34.0)
Platelets: 147 10*3/uL — ABNORMAL LOW (ref 150–400)
RBC: 4.11 MIL/uL — ABNORMAL LOW (ref 4.22–5.81)

## 2013-07-18 LAB — POCT I-STAT TROPONIN I: Troponin i, poc: 0.01 ng/mL (ref 0.00–0.08)

## 2013-07-18 NOTE — ED Notes (Signed)
The pt is c/o  Stiffness in his lt neck tonight and pain..  No sob no dizziness no nausea.  No previous history

## 2013-07-18 NOTE — ED Notes (Signed)
He has had this pain for 2 hours while he was  Sitting watching tv

## 2013-07-19 ENCOUNTER — Emergency Department (HOSPITAL_COMMUNITY)
Admission: EM | Admit: 2013-07-19 | Discharge: 2013-07-19 | Disposition: A | Discharge status: Home / Self Care | Payer: Medicare Other | Attending: Emergency Medicine | Admitting: Emergency Medicine

## 2013-07-19 ENCOUNTER — Emergency Department (HOSPITAL_COMMUNITY): Payer: Medicare Other

## 2013-07-19 DIAGNOSIS — M542 Cervicalgia: Secondary | ICD-10-CM

## 2013-07-19 MED ORDER — PREDNISONE 10 MG PO TABS: 20.0000 mg | ORAL_TABLET | Freq: Every day | ORAL | Status: DC

## 2013-07-19 MED ORDER — OXYCODONE-ACETAMINOPHEN 5-325 MG PO TABS: 2.0000 | ORAL_TABLET | ORAL | Status: DC | PRN

## 2013-07-19 MED ORDER — HYDROCODONE-ACETAMINOPHEN 5-325 MG PO TABS
2.0000 | ORAL_TABLET | Freq: Once | ORAL | Status: AC
Start: 2013-07-19 — End: 2013-07-19
  Administered 2013-07-19: 2 via ORAL
  Filled 2013-07-19: qty 2

## 2013-07-19 NOTE — ED Notes (Signed)
D/C'd IV

## 2013-07-19 NOTE — ED Provider Notes (Addendum)
CSN: [id number]     Arrival date & time 07/18/13  2156 History     First MD Initiated Contact with Patient 07/19/13 0044     Chief Complaint  Patient presents with  . Neck Pain   (Consider location/radiation/quality/duration/timing/severity/associated sxs/prior Treatment) HPI Comments: Patient is a 62 year old male with past medical history significant for diabetes, hypertension, and chronic renal insufficiency nearing dialysis. He presents this evening with complaints of pain in the left posterior neck which started while riding in a car. He denies any injury or trauma. He denies any radiation down his arm and denies any weakness in the left arm. He denies any chest pain or shortness of breath. His pain is worse with turning his head. There is no headache, no fever.  Patient is a 62 y.o. male presenting with neck pain. The history is provided by the patient.  Neck Pain Pain location:  L side Quality:  Stabbing Pain radiates to:  Does not radiate Pain severity:  Moderate Pain is:  Same all the time Onset quality:  Sudden Duration: Started at 6PM. Timing:  Constant Progression:  Unchanged Chronicity:  New Context: not fall   Relieved by:  Nothing Exacerbated by: Turning head.   Past Medical History  Diagnosis Date  . Diabetes mellitus   . Hypertension   . Aneurysm of aorta   . Aneurysm of abdominal aorta   . Cataract, bilateral   . Renal disorder   . Stroke     CVA effected R eye  &R. leg  . Emphysema of lung   . GERD (gastroesophageal reflux disease)   . Hyperlipidemia   . Thoracic aortic aneurysm current   Past Surgical History  Procedure Laterality Date  . Abdominal aortic aneurysm repair  2006  . Hernia repair  2005    R inguinal  . Appendectomy  1954   Family History  Problem Relation Age of Onset  . Cancer Mother   . Diabetes Mother   . Heart failure Father   . Stroke Father    History  Substance Use Topics  . Smoking status: Former Smoker -- 1.00  packs/day    Quit date: 01/02/2012  . Smokeless tobacco: Never Used  . Alcohol Use: No     Comment: not since in hospital in January, used to have a beer occasionally    Review of Systems  HENT: Positive for neck pain.   All other systems reviewed and are negative.    Allergies  Review of patient's allergies indicates no known allergies.  Home Medications   Current Outpatient Rx  Name  Route  Sig  Dispense  Refill  . allopurinol (ZYLOPRIM) 100 MG tablet   Oral   Take 100 mg by mouth daily.           Marland Kitchen aspirin EC 81 MG tablet   Oral   Take 81 mg by mouth every morning.           Marland Kitchen atorvastatin (LIPITOR) 40 MG tablet   Oral   Take 40 mg by mouth daily.         . cloNIDine (CATAPRES) 0.3 MG tablet   Oral   Take 0.3 mg by mouth 3 (three) times daily.         . colchicine 0.6 MG tablet   Oral   Take 0.6 mg by mouth as directed. With sign of gout attack / doctors direction         . diltiazem (TIAZAC) 360 MG 24  hr capsule   Oral   Take 360 mg by mouth daily.           . feeding supplement (GLUCERNA SHAKE) LIQD   Oral   Take 237 mL by mouth daily.         . hydrALAZINE (APRESOLINE) 25 MG tablet   Oral   Take 25 mg by mouth 2 (two) times daily.          . insulin aspart (NOVOLOG) 100 UNIT/ML injection   Subcutaneous   Inject 3 Units into the skin 3 (three) times daily with meals.         . insulin glargine (LANTUS) 100 UNIT/ML injection   Subcutaneous   Inject 12 Units into the skin at bedtime. Please provide the patient Insulin Pens, Each pen containing 10 units of lantus to be given daily.         Marland Kitchen labetalol (NORMODYNE) 200 MG tablet   Oral   Take 200 mg by mouth 2 (two) times daily.         Marland Kitchen linagliptin (TRADJENTA) 5 MG TABS tablet   Oral   Take 5 mg by mouth daily.         . Tamsulosin HCl (FLOMAX) 0.4 MG CAPS   Oral   Take 0.4 mg by mouth daily after supper.          . vitamin B-12 (CYANOCOBALAMIN) 250 MCG tablet    Oral   Take 250 mcg by mouth daily.          BP 133/89  Pulse 67  Temp(Src) 98.3 F (36.8 C) (Oral)  Resp 20  SpO2 95% Physical Exam  Nursing note and vitals reviewed. Constitutional: He is oriented to person, place, and time. He appears well-developed and well-nourished. No distress.  HENT:  Head: Atraumatic.  Mouth/Throat: Oropharynx is clear and moist.  Neck: Neck supple.  There is tenderness to palpation in the soft tissues of the posterior left cervical region. There is no bony tenderness and no step-off. He has pain with range of motion but is able to turn his head and look up and down.  Cardiovascular: Normal rate, regular rhythm and normal heart sounds.   No murmur heard. Pulmonary/Chest: Effort normal and breath sounds normal. No respiratory distress. He has no wheezes.  Abdominal: Soft. Bowel sounds are normal. He exhibits no distension. There is no tenderness.  Musculoskeletal: Normal range of motion. He exhibits no edema.  Neurological: He is alert and oriented to person, place, and time. No cranial nerve deficit. He exhibits normal muscle tone. Coordination normal.  Skin: Skin is warm and dry. He is not diaphoretic.    ED Course   Procedures (including critical care time)  Labs Reviewed  CBC - Abnormal; Notable for the following:    WBC 14.3 (*)    RBC 4.11 (*)    Hemoglobin 12.0 (*)    HCT 34.2 (*)    RDW 18.8 (*)    Platelets 147 (*)    All other components within normal limits  BASIC METABOLIC PANEL - Abnormal; Notable for the following:    Glucose, Bld 69 (*)    BUN 53 (*)    Creatinine, Ser 4.95 (*)    GFR calc non Af Amer 11 (*)    GFR calc Af Amer 13 (*)    All other components within normal limits  POCT I-STAT TROPONIN I   No results found. No diagnosis found.   Date: 07/19/2013  Rate: 69  Rhythm: normal sinus rhythm  QRS Axis: left  Intervals: PR prolonged  ST/T Wave abnormalities: nonspecific T wave changes  Conduction  Disutrbances:first-degree A-V block   Narrative Interpretation:   Old EKG Reviewed: unchanged    MDM  Patient presents here with complaints of pain in the left side of his neck that started this evening. There is no injury or trauma and symptoms seem very musculoskeletal in nature. Strength is equal in both upper extremities as are pulses in both upper extremities. He has a history of a thoracic aneurysm however I doubt this is related to tinnitus presentation. I will treat with prednisone and pain medication and feels the patient is appropriate for discharge. The x-rays of the cervical spine do reveal multilevel disc disease but I suspect is the likely etiology. Also his creatinine was found to be elevated above baseline at 4.9. Upon reviewing his chart I have found it in the 6 range in the past. I do not feel as though there is anything acutely to be done about this as he is not acidotic and is not hyperkalemic. He is to followup with his nephrologist about this.  Geoffery Lyons, MD 07/19/13 9147  Geoffery Lyons, MD 07/19/13 [phone]

## 2013-08-04 ENCOUNTER — Ambulatory Visit: Payer: Medicare Other | Admitting: Vascular Surgery

## 2013-08-13 ENCOUNTER — Encounter: Payer: Self-pay | Admitting: Vascular Surgery

## 2013-08-14 ENCOUNTER — Encounter (INDEPENDENT_AMBULATORY_CARE_PROVIDER_SITE_OTHER): Payer: Medicare Other | Admitting: *Deleted

## 2013-08-14 ENCOUNTER — Ambulatory Visit (INDEPENDENT_AMBULATORY_CARE_PROVIDER_SITE_OTHER): Payer: Medicare Other | Admitting: Vascular Surgery

## 2013-08-14 ENCOUNTER — Encounter: Payer: Self-pay | Admitting: Vascular Surgery

## 2013-08-14 VITALS — BP 174/102 | HR 69 | Ht 75.0 in | Wt 178.0 lb

## 2013-08-14 DIAGNOSIS — N186 End stage renal disease: Secondary | ICD-10-CM | POA: Insufficient documentation

## 2013-08-14 DIAGNOSIS — N184 Chronic kidney disease, stage 4 (severe): Secondary | ICD-10-CM | POA: Insufficient documentation

## 2013-08-14 DIAGNOSIS — Z01810 Encounter for preprocedural cardiovascular examination: Secondary | ICD-10-CM

## 2013-08-14 NOTE — Progress Notes (Signed)
[facility]  Referred by:  Cecille Aver, MD [address] [place], [place] [address]  Reason for referral: New access  History of Present Illness  [name] is a 62 y.o. (05-01-1951) male who presents for evaluation for permanent access.  The patient is left hand dominant.  The patient has not had previous access procedures.  Previous central venous cannulation procedures include: none.  The patient has never had a PPM placed.   Past Medical History  Diagnosis Date  . Diabetes mellitus   . Hypertension   . Aneurysm of aorta   . Aneurysm of abdominal aorta   . Cataract, bilateral   . Renal disorder   . Stroke     CVA effected R eye  &R. leg  . Emphysema of lung   . GERD (gastroesophageal reflux disease)   . Hyperlipidemia   . Thoracic aortic aneurysm current    Past Surgical History  Procedure Laterality Date  . Abdominal aortic aneurysm repair  2006  . Hernia repair  2005    R inguinal  . Appendectomy  1954    History   Social History  . Marital Status: Divorced    Spouse Name: [name]    Number of Children: [name]  . Years of Education: [name]   Occupational History  . Not on file.   Social History Main Topics  . Smoking status: Former Smoker -- 1.00 packs/day    Quit date: 01/01/2013  . Smokeless tobacco: Never Used  . Alcohol Use: Yes     Comment: not since in hospital in January, used to have a beer occasionally  . Drug Use: No  . Sexual Activity: Not on file   Other Topics Concern  . Not on file   Social History Narrative  . No narrative on file    Family History  Problem Relation Age of Onset  . Cancer Mother   . Diabetes Mother   . Heart failure Father   . Stroke Father   . Hypertension Sister    Current Outpatient Prescriptions on File Prior to Visit  Medication Sig Dispense Refill  . allopurinol (ZYLOPRIM) 100 MG tablet Take 100 mg by mouth daily.        Marland Kitchen aspirin EC 81 MG tablet Take 81 mg by mouth every  morning.        Marland Kitchen atorvastatin (LIPITOR) 40 MG tablet Take 40 mg by mouth daily.      . cloNIDine (CATAPRES) 0.3 MG tablet Take 0.3 mg by mouth 3 (three) times daily.      . colchicine 0.6 MG tablet Take 0.6 mg by mouth as directed. With sign of gout attack / doctors direction      . diltiazem (TIAZAC) 360 MG 24 hr capsule Take 360 mg by mouth daily.        . feeding supplement (GLUCERNA SHAKE) LIQD Take 237 mL by mouth daily.      . hydrALAZINE (APRESOLINE) 25 MG tablet Take 25 mg by mouth 2 (two) times daily.       . insulin aspart (NOVOLOG) 100 UNIT/ML injection Inject 3 Units into the skin 3 (three) times daily with meals.      . insulin glargine (LANTUS) 100 UNIT/ML injection Inject 12 Units into the skin at bedtime. Please provide the patient Insulin Pens, Each pen containing 10 units of lantus to be given daily.      Marland Kitchen labetalol (NORMODYNE) 200 MG tablet Take 200 mg by  mouth 2 (two) times daily.      Marland Kitchen linagliptin (TRADJENTA) 5 MG TABS tablet Take 5 mg by mouth daily.      Marland Kitchen oxyCODONE-acetaminophen (PERCOCET) 5-325 MG per tablet Take 2 tablets by mouth every 4 (four) hours as needed for pain.  20 tablet  0  . predniSONE (DELTASONE) 10 MG tablet Take 2 tablets (20 mg total) by mouth daily.  20 tablet  0  . Tamsulosin HCl (FLOMAX) 0.4 MG CAPS Take 0.4 mg by mouth daily after supper.       . vitamin B-12 (CYANOCOBALAMIN) 250 MCG tablet Take 250 mcg by mouth daily.       No current facility-administered medications on file prior to visit.   No Known Allergies  REVIEW OF SYSTEMS:  (Positives checked otherwise negative)  CARDIOVASCULAR:  []  chest pain, []  chest pressure, []  palpitations, []  shortness of breath when laying flat, []  shortness of breath with exertion,  []  pain in feet when walking, []  pain in feet when laying flat, []  history of blood clot in veins (DVT), []  history of phlebitis, []  swelling in legs, []  varicose veins  PULMONARY:  []  productive cough, []  asthma, []   wheezing  NEUROLOGIC:  []  weakness in arms or legs, []  numbness in arms or legs, []  difficulty speaking or slurred speech, []  temporary loss of vision in one eye, []  dizziness  HEMATOLOGIC:  []  bleeding problems, []  problems with blood clotting too easily  MUSCULOSKEL:  []  joint pain, []  joint swelling  GASTROINTEST:  []  vomiting blood, []  blood in stool     GENITOURINARY:  []  burning with urination, []  blood in urine  PSYCHIATRIC:  []  history of major depression  INTEGUMENTARY:  []  rashes, []  ulcers  CONSTITUTIONAL:  []  fever, []  chills  Physical Examination  Filed Vitals:   08/14/13 1600  BP: 174/102  Pulse: 69  Height: 6\' 3"  (1.905 m)  Weight: 178 lb (80.74 kg)  SpO2: 96%   Body mass index is 22.25 kg/(m^2).  General: A&O x 3, WD, WN  Head: [place]/AT  Ear/Nose/Throat: Hearing grossly intact, nares w/o erythema or drainage, oropharynx w/o Erythema/Exudate, Mallampati score: 3  Eyes: PERRLA, EOMI  Neck: Supple, no nuchal rigidity, no palpable LAD  Pulmonary: Sym exp, good air movt, CTAB, no rales, rhonchi, & wheezing  Cardiac: RRR, Nl S1, S2, no Murmurs, rubs or gallops  Vascular: Vessel Right Left  Radial Palpable Palpable  Ulnar Not Palpable Not Palpable  Brachial Palpable Palpable  Carotid Palpable, without bruit Palpable, without bruit  Aorta Not palpable [name]  Femoral Palpable Palpable  Popliteal Not palpable Not palpable  PT Not Palpable Not Palpable  DP Not Palpable Not Palpable   Gastrointestinal: soft, NTND, -G/R, - HSM, - masses, - CVAT B  Musculoskeletal: M/S 5/5 throughout , Extremities without ischemic changes , L ballotable mass overlying elbow  Neurologic: CN 2-12 intact , Pain and light touch intact in extremities , Motor exam as listed above  Psychiatric: Judgment intact, Mood & affect appropriate for pt's clinical situation  Dermatologic: See M/S exam for extremity exam, no rashes otherwise noted  Lymph : No Cervical, Axillary, or  Inguinal lymphadenopathy   Non-Invasive Vascular Imaging  Vein Mapping  (Date: 08/14/2013):   R arm: acceptable vein conduits include upper arm cephalic and basilic vein  L arm: acceptable vein conduits include upper arm cephalic and basilic vein  Outside Studies/Documentation 4 pages of outside documents were reviewed including: outpatient nephrology clinic.  Medical Decision Making  DENG KEMLER is a 63 y.o. male who presents with chronic kidney disease stage IV-V.   Based on vein mapping and examination, this patient's permanent access options include: R BC AVF vs stage BVT, L BC AVF vs stage BVT,   I had an extensive discussion with this patient in regards to the nature of access surgery, including risk, benefits, and alternatives.    The patient is aware that the risks of access surgery include but are not limited to: bleeding, infection, steal syndrome, nerve damage, ischemic monomelic neuropathy, failure of access to mature, and possible need for additional access procedures in the future.  I discussed with the patient the nature of the staged access procedure, specifically the need for a second operation to transpose the first stage fistula if it matures adequately.   The patient is going consider proceeding and will let us known when he is ready to proceed.  Leonides Sake, MD [facility] Office: [phone] Pager: [phone]  08/14/2013, 4:41 PM

## 2013-08-18 ENCOUNTER — Encounter: Payer: Self-pay | Admitting: Nephrology

## 2013-09-03 ENCOUNTER — Other Ambulatory Visit: Payer: Self-pay | Admitting: *Deleted

## 2013-09-04 ENCOUNTER — Encounter (HOSPITAL_COMMUNITY): Payer: Self-pay | Admitting: Vascular Surgery

## 2013-09-04 ENCOUNTER — Encounter (HOSPITAL_COMMUNITY): Payer: Self-pay | Admitting: Respiratory Therapy

## 2013-09-07 MED ORDER — DEXTROSE 5 % IV SOLN
1.5000 g | INTRAVENOUS | Status: AC
  Administered 2013-09-08: 1.5 g via INTRAVENOUS
  Filled 2013-09-07: qty 1.5

## 2013-09-08 ENCOUNTER — Other Ambulatory Visit: Payer: Self-pay | Admitting: *Deleted

## 2013-09-08 ENCOUNTER — Encounter (HOSPITAL_COMMUNITY): Payer: Self-pay | Admitting: Anesthesiology

## 2013-09-08 ENCOUNTER — Encounter (HOSPITAL_COMMUNITY)
Admission: RE | Disposition: A | Discharge status: Home / Self Care | Payer: Self-pay | Source: Ambulatory Visit | Attending: Vascular Surgery

## 2013-09-08 ENCOUNTER — Ambulatory Visit (HOSPITAL_COMMUNITY)
Admission: RE | Admit: 2013-09-08 | Discharge: 2013-09-08 | Disposition: A | Discharge status: Home / Self Care | Payer: Medicare Other | Source: Ambulatory Visit | Attending: Vascular Surgery | Admitting: Vascular Surgery

## 2013-09-08 ENCOUNTER — Telehealth: Payer: Self-pay | Admitting: Vascular Surgery

## 2013-09-08 ENCOUNTER — Ambulatory Visit (HOSPITAL_COMMUNITY): Payer: Medicare Other | Admitting: Anesthesiology

## 2013-09-08 DIAGNOSIS — M129 Arthropathy, unspecified: Secondary | ICD-10-CM | POA: Insufficient documentation

## 2013-09-08 DIAGNOSIS — I120 Hypertensive chronic kidney disease with stage 5 chronic kidney disease or end stage renal disease: Secondary | ICD-10-CM | POA: Insufficient documentation

## 2013-09-08 DIAGNOSIS — E119 Type 2 diabetes mellitus without complications: Secondary | ICD-10-CM | POA: Insufficient documentation

## 2013-09-08 DIAGNOSIS — J438 Other emphysema: Secondary | ICD-10-CM | POA: Insufficient documentation

## 2013-09-08 DIAGNOSIS — K219 Gastro-esophageal reflux disease without esophagitis: Secondary | ICD-10-CM | POA: Insufficient documentation

## 2013-09-08 DIAGNOSIS — N186 End stage renal disease: Secondary | ICD-10-CM

## 2013-09-08 DIAGNOSIS — N185 Chronic kidney disease, stage 5: Secondary | ICD-10-CM | POA: Insufficient documentation

## 2013-09-08 DIAGNOSIS — Z87891 Personal history of nicotine dependence: Secondary | ICD-10-CM | POA: Insufficient documentation

## 2013-09-08 DIAGNOSIS — N184 Chronic kidney disease, stage 4 (severe): Secondary | ICD-10-CM

## 2013-09-08 DIAGNOSIS — Z4931 Encounter for adequacy testing for hemodialysis: Secondary | ICD-10-CM

## 2013-09-08 DIAGNOSIS — E785 Hyperlipidemia, unspecified: Secondary | ICD-10-CM | POA: Insufficient documentation

## 2013-09-08 DIAGNOSIS — I739 Peripheral vascular disease, unspecified: Secondary | ICD-10-CM | POA: Insufficient documentation

## 2013-09-08 HISTORY — DX: Polyneuropathy, unspecified: G62.9

## 2013-09-08 HISTORY — DX: Unspecified osteoarthritis, unspecified site: M19.90

## 2013-09-08 HISTORY — PX: AV FISTULA PLACEMENT: SHX1204

## 2013-09-08 LAB — GLUCOSE, CAPILLARY
Glucose-Capillary: 123 mg/dL — ABNORMAL HIGH (ref 70–99)
Glucose-Capillary: 116 mg/dL — ABNORMAL HIGH (ref 70–99)

## 2013-09-08 LAB — POCT I-STAT 4, (NA,K, GLUC, HGB,HCT): Hemoglobin: 10.9 g/dL — ABNORMAL LOW (ref 13.0–17.0)

## 2013-09-08 SURGERY — ARTERIOVENOUS (AV) FISTULA CREATION
Anesthesia: General | Site: Arm Upper | Laterality: Right | Wound class: Clean

## 2013-09-08 MED ORDER — OXYCODONE-ACETAMINOPHEN 5-325 MG PO TABS: 2.0000 | ORAL_TABLET | ORAL | Status: DC | PRN

## 2013-09-08 MED ORDER — SODIUM CHLORIDE 0.9 % IV SOLN
INTRAVENOUS | Status: DC
  Administered 2013-09-08: 08:00:00 via INTRAVENOUS

## 2013-09-08 MED ORDER — LIDOCAINE-EPINEPHRINE (PF) 1 %-1:200000 IJ SOLN
INTRAMUSCULAR | Status: AC
  Filled 2013-09-08: qty 10

## 2013-09-08 MED ORDER — ONDANSETRON HCL 4 MG/2ML IJ SOLN: 4.0000 mg | Freq: Four times a day (QID) | INTRAMUSCULAR | Status: DC | PRN

## 2013-09-08 MED ORDER — OXYCODONE HCL 5 MG PO TABS: 5.0000 mg | ORAL_TABLET | Freq: Once | ORAL | Status: DC | PRN

## 2013-09-08 MED ORDER — FENTANYL CITRATE 0.05 MG/ML IJ SOLN
INTRAMUSCULAR | Status: DC | PRN
  Administered 2013-09-08: 100 ug via INTRAVENOUS
  Administered 2013-09-08: 50 ug via INTRAVENOUS

## 2013-09-08 MED ORDER — THROMBIN 20000 UNITS EX SOLR
CUTANEOUS | Status: DC | PRN
  Administered 2013-09-08: 13:00:00 via TOPICAL

## 2013-09-08 MED ORDER — PROPOFOL 10 MG/ML IV BOLUS
INTRAVENOUS | Status: DC | PRN
  Administered 2013-09-08: 140 mg via INTRAVENOUS

## 2013-09-08 MED ORDER — LIDOCAINE HCL (CARDIAC) 20 MG/ML IV SOLN
INTRAVENOUS | Status: DC | PRN
  Administered 2013-09-08: 50 mg via INTRAVENOUS

## 2013-09-08 MED ORDER — OXYCODONE HCL 5 MG/5ML PO SOLN: 5.0000 mg | Freq: Once | ORAL | Status: DC | PRN

## 2013-09-08 MED ORDER — 0.9 % SODIUM CHLORIDE (POUR BTL) OPTIME
TOPICAL | Status: DC | PRN
  Administered 2013-09-08: 1000 mL

## 2013-09-08 MED ORDER — SODIUM CHLORIDE 0.9 % IR SOLN
Status: DC | PRN
  Administered 2013-09-08: 12:00:00

## 2013-09-08 MED ORDER — FENTANYL CITRATE 0.05 MG/ML IJ SOLN: 25.0000 ug | INTRAMUSCULAR | Status: DC | PRN

## 2013-09-08 MED ORDER — THROMBIN 20000 UNITS EX SOLR
CUTANEOUS | Status: AC
  Filled 2013-09-08: qty 20000

## 2013-09-08 SURGICAL SUPPLY — 42 items
ADH SKN CLS APL DERMABOND .7 (GAUZE/BANDAGES/DRESSINGS) ×1
ADH SKN CLS LQ APL DERMABOND (GAUZE/BANDAGES/DRESSINGS) ×1
ARMBAND PINK RESTRICT EXTREMIT (MISCELLANEOUS) ×2 IMPLANT
CANISTER SUCTION 2500CC (MISCELLANEOUS) ×2 IMPLANT
CLIP TI MEDIUM 6 (CLIP) ×2 IMPLANT
CLIP TI WIDE RED SMALL 6 (CLIP) ×2 IMPLANT
COVER PROBE W GEL 5X96 (DRAPES) ×1 IMPLANT
COVER SURGICAL LIGHT HANDLE (MISCELLANEOUS) ×2 IMPLANT
DECANTER SPIKE VIAL GLASS SM (MISCELLANEOUS) ×2 IMPLANT
DERMABOND ADHESIVE PROPEN (GAUZE/BANDAGES/DRESSINGS) ×1
DERMABOND ADVANCED (GAUZE/BANDAGES/DRESSINGS) ×1
DERMABOND ADVANCED .7 DNX12 (GAUZE/BANDAGES/DRESSINGS) ×1 IMPLANT
DERMABOND ADVANCED .7 DNX6 (GAUZE/BANDAGES/DRESSINGS) ×1 IMPLANT
ELECT REM PT RETURN 9FT ADLT (ELECTROSURGICAL) ×2
ELECTRODE REM PT RTRN 9FT ADLT (ELECTROSURGICAL) ×1 IMPLANT
GLOVE BIO SURGEON STRL SZ 6.5 (GLOVE) ×4 IMPLANT
GLOVE BIO SURGEON STRL SZ7 (GLOVE) ×2 IMPLANT
GLOVE BIO SURGEON STRL SZ7.5 (GLOVE) ×1 IMPLANT
GLOVE BIOGEL PI IND STRL 7.5 (GLOVE) ×2 IMPLANT
GLOVE BIOGEL PI INDICATOR 7.5 (GLOVE) ×2
GLOVE SURG SS PI 6.5 STRL IVOR (GLOVE) ×1 IMPLANT
GLOVE SURG SS PI 7.0 STRL IVOR (GLOVE) ×1 IMPLANT
GOWN PREVENTION PLUS XLARGE (GOWN DISPOSABLE) ×2 IMPLANT
GOWN STRL NON-REIN LRG LVL3 (GOWN DISPOSABLE) ×6 IMPLANT
GOWN STRL REIN XL XLG (GOWN DISPOSABLE) ×1 IMPLANT
KIT BASIN OR (CUSTOM PROCEDURE TRAY) ×2 IMPLANT
KIT ROOM TURNOVER OR (KITS) ×2 IMPLANT
NDL HYPO 25GX1X1/2 BEV (NEEDLE) ×1 IMPLANT
NEEDLE HYPO 25GX1X1/2 BEV (NEEDLE) ×2 IMPLANT
NS IRRIG 1000ML POUR BTL (IV SOLUTION) ×2 IMPLANT
PACK CV ACCESS (CUSTOM PROCEDURE TRAY) ×2 IMPLANT
PAD ARMBOARD 7.5X6 YLW CONV (MISCELLANEOUS) ×4 IMPLANT
SPONGE SURGIFOAM ABS GEL 100 (HEMOSTASIS) IMPLANT
SUT MNCRL AB 4-0 PS2 18 (SUTURE) ×2 IMPLANT
SUT PROLENE 6 0 BV (SUTURE) IMPLANT
SUT PROLENE 7 0 BV 1 (SUTURE) ×4 IMPLANT
SUT VIC AB 3-0 SH 27 (SUTURE) ×2
SUT VIC AB 3-0 SH 27X BRD (SUTURE) ×1 IMPLANT
TOWEL OR 17X24 6PK STRL BLUE (TOWEL DISPOSABLE) ×2 IMPLANT
TOWEL OR 17X26 10 PK STRL BLUE (TOWEL DISPOSABLE) ×2 IMPLANT
UNDERPAD 30X30 INCONTINENT (UNDERPADS AND DIAPERS) ×2 IMPLANT
WATER STERILE IRR 1000ML POUR (IV SOLUTION) ×2 IMPLANT

## 2013-09-08 NOTE — Progress Notes (Signed)
Report given to angel rn as caregiver rn

## 2013-09-08 NOTE — Preoperative (Signed)
Beta Blockers   Reason not to administer Beta Blockers:Not Applicable 

## 2013-09-08 NOTE — Anesthesia Postprocedure Evaluation (Signed)
  Anesthesia Post-op Note  Patient: [name]  Procedure(s) Performed: Procedure(s):  CREATION OF A  BRACHIAL CEPHALIC ARTERIOVENOUS (AV) FISTULA   (Right)  Patient Location: PACU  Anesthesia Type:General  Level of Consciousness: awake, oriented, sedated and patient cooperative  Airway and Oxygen Therapy: Patient Spontanous Breathing  Post-op Pain: mild  Post-op Assessment: Post-op Vital signs reviewed, Patient's Cardiovascular Status Stable, Respiratory Function Stable, Patent Airway, No signs of Nausea or vomiting and Pain level controlled  Post-op Vital Signs: stable  Complications: No apparent anesthesia complications

## 2013-09-08 NOTE — Op Note (Signed)
OPERATIVE NOTE   PROCEDURE: right brachiocephalic arteriovenous fistula placement  PRE-OPERATIVE DIAGNOSIS: chronic kidney disease stage IV-V   POST-OPERATIVE DIAGNOSIS: same as above   SURGEON: Leonides Sake, MD  ASSISTANT(S): Doreatha Massed, PAC   ANESTHESIA: general  ESTIMATED BLOOD LOSS: 50 cc  FINDING(S): Palpable thrill and radial pulse at end of case  SPECIMEN(S):  none  INDICATIONS:   [name] is a 62 y.o. male who presents with chronic kidney disease stage IV-V .  The patient is scheduled for right brachiocephalic arteriovenous fistula placement.  The patient is aware the risks include but are not limited to: bleeding, infection, steal syndrome, nerve damage, ischemic monomelic neuropathy, failure to mature, and need for additional procedures.  The patient is aware of the risks of the procedure and elects to proceed forward.  DESCRIPTION: After full informed written consent was obtained from the patient, the patient was brought back to the operating room and placed supine upon the operating table.  Prior to induction, the patient received IV antibiotics.   After obtaining adequate anesthesia, the patient was then prepped and draped in the standard fashion for a right arm access procedure.  I turned my attention first to identifying the patient's cephalic vein and brachial artery.  Using SonoSite guidance, the location of these vessels were marked out on the skin.   I made a transverse incision at the level of the antecubitum and dissected through the subcutaneous tissue and fascia to gain exposure of the brachial artery.  This was noted to be 4 mm in diameter externally.  This was dissected out proximally and distally and controlled with vessel loops .  I then dissected out the cephalic vein.  This was noted to be 3-4 mm in diameter externally.  The distal segment of the vein was ligated with a  2-0 silk, and the vein was transected.  The proximal segment was iinterrogated  with serial dilators.  The vein accepted up to a 3-3.5 mm dilator without any difficulty.  I then instilled the heparinized saline into the vein and clamped it.  At this point, I reset my exposure of the brachial artery and placed the artery under tension proximally and distally.  I made an arteriotomy with a #11 blade, and then I extended the arteriotomy with a Potts scissor.  I injected heparinized saline proximal and distal to this arteriotomy.  The vein was then sewn to the artery in an end-to-side configuration with a running stitch of 7-0 Prolene.  Prior to completing this anastomosis, I allowed the vein and artery to backbleed.  There was no evidence of clot from any vessels.  I completed the anastomosis in the usual fashion and then released all vessel loops and clamps.  There was a palpable thrill in the venous outflow, and there was a palpable radial pulse.  At this point, I irrigated out the surgical wound.  There was no further active bleeding.  The subcutaneous tissue was reapproximated with a running stitch of 3-0 Vicryl.  The skin was then reapproximated with a running subcuticular stitch of 4-0 Vicryl.  The skin was then cleaned, dried, and reinforced with Dermabond.  The patient tolerated this procedure well.   COMPLICATIONS: none  CONDITION: stable  Leonides Sake, MD [facility] Office: [phone] Pager: [phone]  09/08/2013, 12:53 PM

## 2013-09-08 NOTE — H&P (View-Only) (Signed)
[facility] [place]  Referred by:  Cecille Aver, MD [address] [place], [place] [address]  Reason for referral: New access  History of Present Illness  [name] is a 62 y.o. (28-Aug-1951) male who presents for evaluation for permanent access.  The patient is left hand dominant.  The patient has not had previous access procedures.  Previous central venous cannulation procedures include: none.  The patient has never had a PPM placed.   Past Medical History  Diagnosis Date  . Diabetes mellitus   . Hypertension   . Aneurysm of aorta   . Aneurysm of abdominal aorta   . Cataract, bilateral   . Renal disorder   . Stroke     CVA effected R eye  &R. leg  . Emphysema of lung   . GERD (gastroesophageal reflux disease)   . Hyperlipidemia   . Thoracic aortic aneurysm current    Past Surgical History  Procedure Laterality Date  . Abdominal aortic aneurysm repair  2006  . Hernia repair  2005    R inguinal  . Appendectomy  1954    History   Social History  . Marital Status: Divorced    Spouse Name: [name]    Number of Children: [name]  . Years of Education: [name]   Occupational History  . Not on file.   Social History Main Topics  . Smoking status: Former Smoker -- 1.00 packs/day    Quit date: 01/01/2013  . Smokeless tobacco: Never Used  . Alcohol Use: Yes     Comment: not since in hospital in January, used to have a beer occasionally  . Drug Use: No  . Sexual Activity: Not on file   Other Topics Concern  . Not on file   Social History Narrative  . No narrative on file    Family History  Problem Relation Age of Onset  . Cancer Mother   . Diabetes Mother   . Heart failure Father   . Stroke Father   . Hypertension Sister    Current Outpatient Prescriptions on File Prior to Visit  Medication Sig Dispense Refill  . allopurinol (ZYLOPRIM) 100 MG tablet Take 100 mg by mouth daily.        Marland Kitchen aspirin EC 81 MG tablet Take 81 mg by mouth every  morning.        Marland Kitchen atorvastatin (LIPITOR) 40 MG tablet Take 40 mg by mouth daily.      . cloNIDine (CATAPRES) 0.3 MG tablet Take 0.3 mg by mouth 3 (three) times daily.      . colchicine 0.6 MG tablet Take 0.6 mg by mouth as directed. With sign of gout attack / doctors direction      . diltiazem (TIAZAC) 360 MG 24 hr capsule Take 360 mg by mouth daily.        . feeding supplement (GLUCERNA SHAKE) LIQD Take 237 mL by mouth daily.      . hydrALAZINE (APRESOLINE) 25 MG tablet Take 25 mg by mouth 2 (two) times daily.       . insulin aspart (NOVOLOG) 100 UNIT/ML injection Inject 3 Units into the skin 3 (three) times daily with meals.      . insulin glargine (LANTUS) 100 UNIT/ML injection Inject 12 Units into the skin at bedtime. Please provide the patient Insulin Pens, Each pen containing 10 units of lantus to be given daily.      Marland Kitchen labetalol (NORMODYNE) 200 MG tablet Take 200 mg by  mouth 2 (two) times daily.      Marland Kitchen linagliptin (TRADJENTA) 5 MG TABS tablet Take 5 mg by mouth daily.      Marland Kitchen oxyCODONE-acetaminophen (PERCOCET) 5-325 MG per tablet Take 2 tablets by mouth every 4 (four) hours as needed for pain.  20 tablet  0  . predniSONE (DELTASONE) 10 MG tablet Take 2 tablets (20 mg total) by mouth daily.  20 tablet  0  . Tamsulosin HCl (FLOMAX) 0.4 MG CAPS Take 0.4 mg by mouth daily after supper.       . vitamin B-12 (CYANOCOBALAMIN) 250 MCG tablet Take 250 mcg by mouth daily.       No current facility-administered medications on file prior to visit.   No Known Allergies  REVIEW OF SYSTEMS:  (Positives checked otherwise negative)  CARDIOVASCULAR:  []  chest pain, []  chest pressure, []  palpitations, []  shortness of breath when laying flat, []  shortness of breath with exertion,  []  pain in feet when walking, []  pain in feet when laying flat, []  history of blood clot in veins (DVT), []  history of phlebitis, []  swelling in legs, []  varicose veins  PULMONARY:  []  productive cough, []  asthma, []   wheezing  NEUROLOGIC:  []  weakness in arms or legs, []  numbness in arms or legs, []  difficulty speaking or slurred speech, []  temporary loss of vision in one eye, []  dizziness  HEMATOLOGIC:  []  bleeding problems, []  problems with blood clotting too easily  MUSCULOSKEL:  []  joint pain, []  joint swelling  GASTROINTEST:  []  vomiting blood, []  blood in stool     GENITOURINARY:  []  burning with urination, []  blood in urine  PSYCHIATRIC:  []  history of major depression  INTEGUMENTARY:  []  rashes, []  ulcers  CONSTITUTIONAL:  []  fever, []  chills  Physical Examination  Filed Vitals:   08/14/13 1600  BP: 174/102  Pulse: 69  Height: 6\' 3"  (1.905 m)  Weight: 178 lb (80.74 kg)  SpO2: 96%   Body mass index is 22.25 kg/(m^2).  General: A&O x 3, WD, WN  Head: [place]/AT  Ear/Nose/Throat: Hearing grossly intact, nares w/o erythema or drainage, oropharynx w/o Erythema/Exudate, Mallampati score: 3  Eyes: PERRLA, EOMI  Neck: Supple, no nuchal rigidity, no palpable LAD  Pulmonary: Sym exp, good air movt, CTAB, no rales, rhonchi, & wheezing  Cardiac: RRR, Nl S1, S2, no Murmurs, rubs or gallops  Vascular: Vessel Right Left  Radial Palpable Palpable  Ulnar Not Palpable Not Palpable  Brachial Palpable Palpable  Carotid Palpable, without bruit Palpable, without bruit  Aorta Not palpable [name]  Femoral Palpable Palpable  Popliteal Not palpable Not palpable  PT Not Palpable Not Palpable  DP Not Palpable Not Palpable   Gastrointestinal: soft, NTND, -G/R, - HSM, - masses, - CVAT B  Musculoskeletal: M/S 5/5 throughout , Extremities without ischemic changes , L ballotable mass overlying elbow  Neurologic: CN 2-12 intact , Pain and light touch intact in extremities , Motor exam as listed above  Psychiatric: Judgment intact, Mood & affect appropriate for pt's clinical situation  Dermatologic: See M/S exam for extremity exam, no rashes otherwise noted  Lymph : No Cervical, Axillary, or  Inguinal lymphadenopathy   Non-Invasive Vascular Imaging  Vein Mapping  (Date: 08/14/2013):   R arm: acceptable vein conduits include upper arm cephalic and basilic vein  L arm: acceptable vein conduits include upper arm cephalic and basilic vein  Outside Studies/Documentation 4 pages of outside documents were reviewed including: outpatient nephrology clinic.  Medical Decision Making  FIORE DETJEN is a 62 y.o. male who presents with chronic kidney disease stage IV-V.   Based on vein mapping and examination, this patient's permanent access options include: R BC AVF vs stage BVT, L BC AVF vs stage BVT,   I had an extensive discussion with this patient in regards to the nature of access surgery, including risk, benefits, and alternatives.    The patient is aware that the risks of access surgery include but are not limited to: bleeding, infection, steal syndrome, nerve damage, ischemic monomelic neuropathy, failure of access to mature, and possible need for additional access procedures in the future.  I discussed with the patient the nature of the staged access procedure, specifically the need for a second operation to transpose the first stage fistula if it matures adequately.   The patient is going consider proceeding and will let us known when he is ready to proceed.  Leonides Sake, MD [facility] [place] Office: [phone] Pager: [phone]  08/14/2013, 4:41 PM

## 2013-09-08 NOTE — Interval H&P Note (Signed)
History and Physical Interval Note:  09/08/2013 10:28 AM  [name]  has presented today for surgery, with the diagnosis of ESRD  The various methods of treatment have been discussed with the patient and family. After consideration of risks, benefits and other options for treatment, the patient has consented to  Procedure(s): ARTERIOVENOUS (AV) FISTULA CREATION BRACIAL-CEPHALIC (Right) as a surgical intervention .  The patient's history has been reviewed, patient examined, no change in status, stable for surgery.  I have reviewed the patient's chart and labs.  Questions were answered to the patient's satisfaction.     CHEN,BRIAN LIANG-YU

## 2013-09-08 NOTE — Interval H&P Note (Signed)
[facility]  History and Physical Update  The patient was interviewed and re-examined.  The patient's previous History and Physical has been reviewed and is unchanged.  There is no change in the plan of care: R BC AVF.  Leonides Sake, MD [facility] [place] Office: [phone] Pager: [phone]  09/08/2013, 7:32 AM

## 2013-09-08 NOTE — Telephone Encounter (Addendum)
Message copied by Rosalyn Charters on Tue Sep 08, 2013  4:50 PM ------      Message from: Melene Plan      Created: Tue Sep 08, 2013  1:10 PM                   ----- Message -----         From: Fransisco Hertz, MD         Sent: 09/08/2013  12:55 PM           To: Reuel Derby, Melene Plan, RN            Fredrich Birks      [phone]      [birth year]            PROCEDURE:      right brachiocephalic arteriovenous fistula placement            Asst: Doreatha Massed, [facility]             Follow-up: 4 weeks ------  unable to reach patient by phone, mailed appt. letter, notified patient of fu appt on 10-09-13 at 11:30 am

## 2013-09-08 NOTE — Transfer of Care (Signed)
Immediate Anesthesia Transfer of Care Note  Patient: [name]  Procedure(s) Performed: Procedure(s):  CREATION OF A  BRACHIAL CEPHALIC ARTERIOVENOUS (AV) FISTULA   (Right)  Patient Location: PACU  Anesthesia Type:General  Level of Consciousness: awake, alert  and oriented  Airway & Oxygen Therapy: Patient Spontanous Breathing and Patient connected to nasal cannula oxygen  Post-op Assessment: Report given to PACU RN and Post -op Vital signs reviewed and stable  Post vital signs: Reviewed and stable  Complications: No apparent anesthesia complications

## 2013-09-08 NOTE — Anesthesia Preprocedure Evaluation (Signed)
Anesthesia Evaluation  Patient identified by MRN, date of birth, ID band Patient awake    Reviewed: Allergy & Precautions, H&P , NPO status , Patient's Chart, lab work & pertinent test results  Airway Mallampati: II  Neck ROM: full    Dental   Pulmonary COPDformer smoker,          Cardiovascular hypertension, + Peripheral Vascular Disease     Neuro/Psych    GI/Hepatic GERD-  ,  Endo/Other  diabetes, Type 2  Renal/GU ESRF and DialysisRenal disease     Musculoskeletal  (+) Arthritis -,   Abdominal   Peds  Hematology   Anesthesia Other Findings   Reproductive/Obstetrics                           Anesthesia Physical Anesthesia Plan  ASA: III  Anesthesia Plan: General   Post-op Pain Management:    Induction: Intravenous  Airway Management Planned: LMA  Additional Equipment:   Intra-op Plan:   Post-operative Plan:   Informed Consent: I have reviewed the patients History and Physical, chart, labs and discussed the procedure including the risks, benefits and alternatives for the proposed anesthesia with the patient or authorized representative who has indicated his/her understanding and acceptance.     Plan Discussed with: CRNA, Anesthesiologist and Surgeon  Anesthesia Plan Comments:         Anesthesia Quick Evaluation

## 2013-09-09 ENCOUNTER — Ambulatory Visit: Payer: Self-pay | Admitting: Podiatrist

## 2013-09-11 ENCOUNTER — Encounter (HOSPITAL_COMMUNITY): Payer: Self-pay | Admitting: Vascular Surgery

## 2013-09-23 ENCOUNTER — Ambulatory Visit (INDEPENDENT_AMBULATORY_CARE_PROVIDER_SITE_OTHER): Payer: Medicare Other | Admitting: Podiatrist

## 2013-09-23 ENCOUNTER — Encounter: Payer: Self-pay | Admitting: Podiatrist

## 2013-09-23 VITALS — Ht 75.0 in | Wt 180.0 lb

## 2013-09-23 DIAGNOSIS — B351 Tinea unguium: Secondary | ICD-10-CM

## 2013-09-23 DIAGNOSIS — M79609 Pain in unspecified limb: Secondary | ICD-10-CM

## 2013-09-23 NOTE — Patient Instructions (Signed)

## 2013-09-23 NOTE — Progress Notes (Signed)
  Subjective:    Patient ID: [name], male    DOB: 05/13/1951, 62 y.o.   MRN: [id number]  HPI    Patient presents today for follow up of foot and nail care. he had a fistula on the right upper arm due to access of going on dialysis  Review of Systems  Constitutional: Negative.   HENT: Negative.   Eyes: Negative.   Respiratory: Positive for cough.   Cardiovascular: Negative.   Gastrointestinal: Negative.   Endocrine: Positive for cold intolerance.  Genitourinary: Negative.   Musculoskeletal: Negative.   Skin: Negative.   Allergic/Immunologic: Negative.   Neurological: Negative.   Hematological: Negative.   Psychiatric/Behavioral: Negative.        Objective:   Physical Exam  .  Objective:  Patients chart is reviewed.  Neurovascular status unchanged.  Patients nails are thickened, discolored, distrophic, friable and brittle with yellow-brown discoloration. Patient subjectively relates they are painful with shoes and with ambulation.both feet       Assessment & Plan:  Assessment:  Symptomatic onychomycosis  Plan:  Discussed treatment options and alternatives.  The symptomatic toenails were debrided through manual an mechanical means without complication.  Return appointment recommended at routine intervals of 3 months

## 2013-10-09 ENCOUNTER — Encounter: Payer: Self-pay | Admitting: Vascular Surgery

## 2013-10-09 ENCOUNTER — Ambulatory Visit (INDEPENDENT_AMBULATORY_CARE_PROVIDER_SITE_OTHER): Payer: Self-pay | Admitting: Vascular Surgery

## 2013-10-09 ENCOUNTER — Ambulatory Visit: Payer: Medicare Other | Admitting: Vascular Surgery

## 2013-10-09 VITALS — BP 151/81 | HR 60 | Resp 16 | Ht 75.0 in | Wt 180.0 lb

## 2013-10-09 DIAGNOSIS — N184 Chronic kidney disease, stage 4 (severe): Secondary | ICD-10-CM

## 2013-10-09 DIAGNOSIS — N186 End stage renal disease: Secondary | ICD-10-CM

## 2013-10-09 DIAGNOSIS — Z48812 Encounter for surgical aftercare following surgery on the circulatory system: Secondary | ICD-10-CM

## 2013-10-09 NOTE — Progress Notes (Signed)
[facility]   Postoperative Access Visit  History of Present Illness  [name] is a 62 y.o. year old male who presents for postoperative follow-up for: R BC AVF (Date: 09/08/13).  The patient's wounds are healed.  The patient notes no steal symptoms.  The patient is able to complete their activities of daily living.  The patient's current symptoms are: none.  For VQI Use Only  PRE-ADM LIVING: Home  AMB STATUS: Ambulatory  Physical Examination Filed Vitals:   10/09/13 1440  BP: 151/81  Pulse: 60  Resp: 16    RUE: Incision is healed, skin feels warm, hand grip is 5/5, sensation in digits is intact, palpable thrill, bruit can be auscultated   Medical Decision Making  [name] is a 62 y.o. year old male who presents s/p R BC AVF.  Follow up in 4 weeks for formal access duplex.  I suspect the fistula will be ready in 4 weeks.  Thank you for allowing [place] to participate in this patient's care.  Leonides Sake, MD [facility] [place] Office: [phone] Pager: [phone]  10/09/2013, 2:53 PM

## 2013-10-16 ENCOUNTER — Other Ambulatory Visit: Payer: Self-pay | Admitting: *Deleted

## 2013-10-16 DIAGNOSIS — I712 Thoracic aortic aneurysm, without rupture, unspecified: Secondary | ICD-10-CM

## 2013-10-28 ENCOUNTER — Other Ambulatory Visit: Payer: Medicare Other

## 2013-10-28 ENCOUNTER — Ambulatory Visit: Payer: Medicare Other | Admitting: Cardiothoracic Surgery

## 2013-11-05 ENCOUNTER — Encounter: Payer: Self-pay | Admitting: Vascular Surgery

## 2013-11-06 ENCOUNTER — Encounter (INDEPENDENT_AMBULATORY_CARE_PROVIDER_SITE_OTHER): Payer: Self-pay

## 2013-11-06 ENCOUNTER — Encounter: Payer: Self-pay | Admitting: Vascular Surgery

## 2013-11-06 ENCOUNTER — Ambulatory Visit (HOSPITAL_COMMUNITY)
Admission: RE | Admit: 2013-11-06 | Discharge: 2013-11-06 | Disposition: A | Discharge status: Home / Self Care | Payer: Medicare Other | Source: Ambulatory Visit | Attending: Vascular Surgery | Admitting: Vascular Surgery

## 2013-11-06 ENCOUNTER — Ambulatory Visit (INDEPENDENT_AMBULATORY_CARE_PROVIDER_SITE_OTHER): Payer: Self-pay | Admitting: Vascular Surgery

## 2013-11-06 VITALS — BP 176/94 | HR 111 | Resp 20 | Ht 75.0 in | Wt 178.0 lb

## 2013-11-06 DIAGNOSIS — N186 End stage renal disease: Secondary | ICD-10-CM | POA: Insufficient documentation

## 2013-11-06 DIAGNOSIS — N184 Chronic kidney disease, stage 4 (severe): Secondary | ICD-10-CM

## 2013-11-06 DIAGNOSIS — Z48812 Encounter for surgical aftercare following surgery on the circulatory system: Secondary | ICD-10-CM | POA: Insufficient documentation

## 2013-11-06 NOTE — Progress Notes (Signed)
[facility] [place]  Postoperative Access Visit  History of Present Illness  [name] is a 62 y.o. year old male who presents for postoperative follow-up for: R BC AVF (Date: 09/08/13).  The patient's wounds are healed.  The patient notes no steal symptoms.  The patient is able to complete their activities of daily living.  The patient's current symptoms are: none.  For VQI Use Only  PRE-ADM LIVING: Home  AMB STATUS: Ambulatory  Physical Examination Filed Vitals:   11/06/13 1605  BP: 176/94  Pulse: 111  Resp: 20    RUE: Incision is healed, skin feels warm, hand grip is 5/5, sensation in digits is intact, palpable thrill, bruit can be auscultated   RUE access duplex (11/06/2013)  Distal: 5.8-7.2 mm  Mid-Proximal: 3.4-4.4 mm  Medical Decision Making  [name] is a 62 y.o. year old male who presents s/p R BC AVF.  Duplex suggest some possible stenoses within the fistula, possible frozen valves versus prior damaged segments.  I would waiting another month to see if the diameters improve, as the the fistula is easily palpable.  The shear stress should eventually result in some fistula dilation.  If in a month, it continues to be inadequate, I would consider a balloon assisted maturation procedure.  Thank you for allowing [place] to participate in this patient's care.  Leonides Sake, MD [facility] [place] Office: [phone] Pager: [phone]  11/06/2013, 4:27 PM

## 2013-11-11 ENCOUNTER — Ambulatory Visit
Admission: RE | Admit: 2013-11-11 | Discharge: 2013-11-11 | Disposition: A | Discharge status: Home / Self Care | Payer: Medicare Other | Source: Ambulatory Visit | Attending: Cardiothoracic Surgery | Admitting: Cardiothoracic Surgery

## 2013-11-11 ENCOUNTER — Encounter: Payer: Self-pay | Admitting: Cardiothoracic Surgery

## 2013-11-11 ENCOUNTER — Ambulatory Visit (INDEPENDENT_AMBULATORY_CARE_PROVIDER_SITE_OTHER): Payer: Medicare Other | Admitting: Cardiothoracic Surgery

## 2013-11-11 VITALS — BP 140/73 | HR 62 | Resp 20 | Ht 75.0 in | Wt 178.0 lb

## 2013-11-11 DIAGNOSIS — I712 Thoracic aortic aneurysm, without rupture: Secondary | ICD-10-CM

## 2013-11-12 NOTE — Progress Notes (Signed)
PCP is Alysia Penna, MD Referring Provider is Alysia Penna, MD  Chief Complaint  Patient presents with  . Thoracic Aortic Aneurysm    1 year f/u with Chest CT    HPI: Chronically ill 62 year old gentleman with hypertension, chronic renal failure nearing dialysis, old stroke, severe COPD and tobacco abuse with a 5 cm descending thoracic aorta, stable on CT scan over the past several years. He denies chest pain. He is not an operative candidate for open resection and grafting and his anatomy is poor for stent grafting. Medical management of hypertension and smoking cessation are recommended as his best option. He is not a surgical candidate  CT of chest without contrast today shows no change in the descending thoracic aorta which remains 5 cm and then tapers back towards normal and the diaphragm. The ascending aorta appears to be approximately 4 cm.   Past Medical History  Diagnosis Date  . Diabetes mellitus   . Hypertension   . Aneurysm of aorta   . Aneurysm of abdominal aorta   . Cataract, bilateral   . Emphysema of lung   . GERD (gastroesophageal reflux disease)   . Hyperlipidemia   . Thoracic aortic aneurysm current  . Stroke     CVA effected R eye  &R. leg.  had a few strokes  . Renal disorder     Dr Lysle Dingwall follows  . Neuropathy   . Arthritis     Past Surgical History  Procedure Laterality Date  . Abdominal aortic aneurysm repair  2006  . Hernia repair  2005    R inguinal  . Appendectomy  1954  . Av fistula placement Right 09/08/2013    Procedure:  CREATION OF A  BRACHIAL CEPHALIC ARTERIOVENOUS (AV) FISTULA  ;  Surgeon: Fransisco Hertz, MD;  Location: [facility] OR;  Service: Vascular;  Laterality: Right;    Family History  Problem Relation Age of Onset  . Cancer Mother   . Diabetes Mother   . Heart failure Father   . Stroke Father   . Hypertension Sister     Social History History  Substance Use Topics  . Smoking status: Former Smoker -- 0.00 packs/day for  20 years    Quit date: 01/01/2013  . Smokeless tobacco: Never Used  . Alcohol Use: Yes     Comment: not since in hospital in January, used to have a beer occasionally    Current Outpatient Prescriptions  Medication Sig Dispense Refill  . allopurinol (ZYLOPRIM) 100 MG tablet Take 100 mg by mouth daily.        Marland Kitchen aspirin EC 81 MG tablet Take 81 mg by mouth every morning.        Marland Kitchen atorvastatin (LIPITOR) 40 MG tablet Take 40 mg by mouth daily.      . calcitRIOL (ROCALTROL) 0.25 MCG capsule Take 0.25 mcg by mouth daily.      . cloNIDine (CATAPRES) 0.3 MG tablet Take 0.3 mg by mouth 3 (three) times daily.      . colchicine 0.6 MG tablet Take 0.6 mg by mouth daily. With sign of gout attack / doctors direction      . diltiazem (TIAZAC) 360 MG 24 hr capsule Take 360 mg by mouth daily.        . feeding supplement (GLUCERNA SHAKE) LIQD Take 237 mL by mouth daily.      . furosemide (LASIX) 40 MG tablet Take 40-80 mg by mouth 2 (two) times daily. Take 2 tablets in the morning  and 1 tablet in the evening      . hydrALAZINE (APRESOLINE) 25 MG tablet Take 25 mg by mouth 3 (three) times daily.       . insulin aspart (NOVOLOG) 100 UNIT/ML injection Inject 3 Units into the skin 3 (three) times daily with meals.      . insulin glargine (LANTUS) 100 UNIT/ML injection Inject 10 Units into the skin at bedtime. Please provide the patient Insulin Pens, Each pen containing 10 units of lantus to be given daily.      Marland Kitchen labetalol (NORMODYNE) 200 MG tablet Take 200 mg by mouth 2 (two) times daily.      Marland Kitchen LANTUS SOLOSTAR 100 UNIT/ML SOPN       . linagliptin (TRADJENTA) 5 MG TABS tablet Take 5 mg by mouth daily.      Marland Kitchen oxyCODONE-acetaminophen (PERCOCET) 5-325 MG per tablet Take 2 tablets by mouth every 4 (four) hours as needed for pain.  20 tablet  0  . pantoprazole (PROTONIX) 20 MG tablet Take 20 mg by mouth daily.      . predniSONE (DELTASONE) 10 MG tablet       . RELION SHORT PEN NEEDLES 31G X 8 MM MISC       .  Tamsulosin HCl (FLOMAX) 0.4 MG CAPS Take 0.4 mg by mouth daily after supper.       . vitamin B-12 (CYANOCOBALAMIN) 250 MCG tablet Take 250 mcg by mouth daily.       No current facility-administered medications for this visit.    No Known Allergies  Review of Systems no chest pain, no symptoms of CHF  BP 140/73  Pulse 62  Resp 20  Ht 6\' 3"  (1.905 m)  Wt 178 lb (80.74 kg)  BMI 22.25 kg/m2  SpO2 96% Physical Exam Patient appears depressed Breath sounds are distant bilaterally Cardiac regular rhythm without murmur Pedal pulses nonpalpable, no JVD Neuro right-sided weakness  Diagnostic Tests: CT chest shows stable 5 cm fusiform dilatation of the entire descending thoracic aorta  Impression: Stable thoracic aortic aneurysm, descending Not.Candidate for surgery  Plan: Return for review in one year with CT scan without contrast

## 2013-12-17 ENCOUNTER — Encounter: Payer: Self-pay | Admitting: Vascular Surgery

## 2013-12-18 ENCOUNTER — Encounter: Payer: Self-pay | Admitting: Vascular Surgery

## 2013-12-18 ENCOUNTER — Ambulatory Visit (INDEPENDENT_AMBULATORY_CARE_PROVIDER_SITE_OTHER): Payer: Self-pay | Admitting: Vascular Surgery

## 2013-12-18 VITALS — BP 186/100 | HR 81 | Resp 20 | Ht 75.0 in | Wt 178.0 lb

## 2013-12-18 DIAGNOSIS — N186 End stage renal disease: Secondary | ICD-10-CM | POA: Insufficient documentation

## 2013-12-18 DIAGNOSIS — N184 Chronic kidney disease, stage 4 (severe): Secondary | ICD-10-CM

## 2013-12-18 NOTE — Progress Notes (Signed)
    Postoperative Access Visit   History of Present Illness  [name] is a 63 y.o. year old male who presents for postoperative follow-up for: R BC AVF (Date: 09/08/13).  The patient's wounds are healed.  The patient notes no steal symptoms.  The patient is able to complete their activities of daily living.  The patient's current symptoms are: none.  For VQI Use Only  PRE-ADM LIVING: Home  AMB STATUS: Ambulatory  Physical Examination Filed Vitals:   12/18/13 1616  BP: 186/100  Pulse: 81  Resp: 20    RUE: Incision is healed, skin feels warm, hand grip is 5/5, sensation in digits is intact, +strongly palpable thrill, bruit can be easily auscultated , on Sonosite: >6 mm in distal half, >5.5 mm in proximal half, large side branch at transition point  Medical Decision Making  [name] is a 63 y.o. year old male who presents s/p R BC AVF.  The patient's access is ready for use.  If there are any cannulation issues, the large side-branch can be ligated to divert additional flow into the upper arm cephalic.  Thank you for allowing [place] to participate in this patient's care.  Adele Barthel, MD [facility] [place] Office: [phone] Pager: [phone]  12/18/2013, 4:53 PM

## 2013-12-30 ENCOUNTER — Ambulatory Visit (INDEPENDENT_AMBULATORY_CARE_PROVIDER_SITE_OTHER): Payer: Medicare Other | Admitting: Podiatrist

## 2013-12-30 ENCOUNTER — Encounter: Payer: Self-pay | Admitting: Podiatrist

## 2013-12-30 VITALS — BP 174/88 | HR 67 | Resp 16

## 2013-12-30 DIAGNOSIS — B351 Tinea unguium: Secondary | ICD-10-CM

## 2013-12-30 DIAGNOSIS — M79609 Pain in unspecified limb: Secondary | ICD-10-CM

## 2013-12-30 NOTE — Progress Notes (Signed)
  Subjective: [name] presents today for continued foot and nail care. He relates no changes and states overall he's doing pretty well. He is diabetic and has chronic kidney disease.  Objective: Pedal pulses are palpable and thready at 1/4 DP and PT bilateral. Capillary refill time is less than 4 seconds bilateral. Proximal to distal cooling is warm to cool. Digital hair growth is absent. Neurological sensation is decreased epicriticaly and protectively bilateral. Pes planus deformity is noted bilaterally and hyperkeratotic lesions are present sub-met one and 5 bilateral. Patient's toenails are elongated, thickened, dystrophic, with ingrown deformity present 1-3 and 4 bilateral. He also relates they're uncomfortable with palpation and debridement.  Assessment: Diabetes with neuropathy, end-stage renal disease, and pes planovalgus, symptomatic onychomycosis  Plan: Debridement of the toenails was carried out at today's visit without complication. He'll be seen back in 3 months for followup. If any problems or concerns arise in the meantime he will call.

## 2013-12-30 NOTE — Patient Instructions (Signed)
Diabetes and Foot Care Diabetes may cause you to have problems because of poor blood supply (circulation) to your feet and legs. This may cause the skin on your feet to become thinner, break easier, and heal more slowly. Your skin may become dry, and the skin may peel and crack. You may also have nerve damage in your legs and feet causing decreased feeling in them. You may not notice minor injuries to your feet that could lead to infections or more serious problems. Taking care of your feet is one of the most important things you can do for yourself.  HOME CARE INSTRUCTIONS  Wear shoes at all times, even in the house. Do not go barefoot. Bare feet are easily injured.  Check your feet daily for blisters, cuts, and redness. If you cannot see the bottom of your feet, use a mirror or ask someone for help.  Wash your feet with warm water (do not use hot water) and mild soap. Then pat your feet and the areas between your toes until they are completely dry. Do not soak your feet as this can dry your skin.  Apply a moisturizing lotion or petroleum jelly (that does not contain alcohol and is unscented) to the skin on your feet and to dry, brittle toenails. Do not apply lotion between your toes.  Trim your toenails straight across. Do not dig under them or around the cuticle. File the edges of your nails with an emery board or nail file.  Do not cut corns or calluses or try to remove them with medicine.  Wear clean socks or stockings every day. Make sure they are not too tight. Do not wear knee-high stockings since they may decrease blood flow to your legs.  Wear shoes that fit properly and have enough cushioning. To break in new shoes, wear them for just a few hours a day. This prevents you from injuring your feet. Always look in your shoes before you put them on to be sure there are no objects inside.  Do not cross your legs. This may decrease the blood flow to your feet.  If you find a minor scrape,  cut, or break in the skin on your feet, keep it and the skin around it clean and dry. These areas may be cleansed with mild soap and water. Do not cleanse the area with peroxide, alcohol, or iodine.  When you remove an adhesive bandage, be sure not to damage the skin around it.  If you have a wound, look at it several times a day to make sure it is healing.  Do not use heating pads or hot water bottles. They may burn your skin. If you have lost feeling in your feet or legs, you may not know it is happening until it is too late.  Make sure your health care provider performs a complete foot exam at least annually or more often if you have foot problems. Report any cuts, sores, or bruises to your health care provider immediately. SEEK MEDICAL CARE IF:   You have an injury that is not healing.  You have cuts or breaks in the skin.  You have an ingrown nail.  You notice redness on your legs or feet.  You feel burning or tingling in your legs or feet.  You have pain or cramps in your legs and feet.  Your legs or feet are numb.  Your feet always feel cold. SEEK IMMEDIATE MEDICAL CARE IF:   There is increasing redness,   swelling, or pain in or around a wound.  There is a red line that goes up your leg.  Pus is coming from a wound.  You develop a fever or as directed by your health care provider.  You notice a bad smell coming from an ulcer or wound. Document Released: 11/16/2000 Document Revised: 07/22/2013 Document Reviewed: 04/28/2013 [employer] Patient Information 2014 [employer].  

## 2014-01-29 ENCOUNTER — Emergency Department (HOSPITAL_COMMUNITY): Payer: Medicare Other

## 2014-01-29 ENCOUNTER — Other Ambulatory Visit: Payer: Self-pay

## 2014-01-29 ENCOUNTER — Emergency Department (HOSPITAL_COMMUNITY)
Admission: EM | Admit: 2014-01-29 | Discharge: 2014-01-29 | Disposition: A | Discharge status: Other Acute Inpatient Hospital | Payer: Medicare Other | Attending: Emergency Medicine | Admitting: Emergency Medicine

## 2014-01-29 ENCOUNTER — Other Ambulatory Visit (HOSPITAL_COMMUNITY): Payer: Self-pay | Admitting: Internal Medicine

## 2014-01-29 ENCOUNTER — Encounter (HOSPITAL_COMMUNITY): Payer: Self-pay | Admitting: Emergency Medicine

## 2014-01-29 ENCOUNTER — Ambulatory Visit (HOSPITAL_COMMUNITY)
Admission: RE | Admit: 2014-01-29 | Discharge: 2014-01-29 | Disposition: A | Discharge status: Home / Self Care | Payer: Medicare Other | Source: Ambulatory Visit | Attending: Vascular Surgery | Admitting: Vascular Surgery

## 2014-01-29 DIAGNOSIS — K219 Gastro-esophageal reflux disease without esophagitis: Secondary | ICD-10-CM | POA: Insufficient documentation

## 2014-01-29 DIAGNOSIS — E1129 Type 2 diabetes mellitus with other diabetic kidney complication: Secondary | ICD-10-CM | POA: Insufficient documentation

## 2014-01-29 DIAGNOSIS — R1084 Generalized abdominal pain: Secondary | ICD-10-CM | POA: Insufficient documentation

## 2014-01-29 DIAGNOSIS — I714 Abdominal aortic aneurysm, without rupture, unspecified: Secondary | ICD-10-CM

## 2014-01-29 DIAGNOSIS — N186 End stage renal disease: Secondary | ICD-10-CM | POA: Insufficient documentation

## 2014-01-29 DIAGNOSIS — Z794 Long term (current) use of insulin: Secondary | ICD-10-CM | POA: Insufficient documentation

## 2014-01-29 DIAGNOSIS — Z87891 Personal history of nicotine dependence: Secondary | ICD-10-CM | POA: Insufficient documentation

## 2014-01-29 DIAGNOSIS — G589 Mononeuropathy, unspecified: Secondary | ICD-10-CM | POA: Insufficient documentation

## 2014-01-29 DIAGNOSIS — I120 Hypertensive chronic kidney disease with stage 5 chronic kidney disease or end stage renal disease: Secondary | ICD-10-CM | POA: Insufficient documentation

## 2014-01-29 DIAGNOSIS — R52 Pain, unspecified: Secondary | ICD-10-CM

## 2014-01-29 DIAGNOSIS — E785 Hyperlipidemia, unspecified: Secondary | ICD-10-CM | POA: Insufficient documentation

## 2014-01-29 DIAGNOSIS — Z7982 Long term (current) use of aspirin: Secondary | ICD-10-CM | POA: Insufficient documentation

## 2014-01-29 DIAGNOSIS — Z992 Dependence on renal dialysis: Secondary | ICD-10-CM | POA: Insufficient documentation

## 2014-01-29 DIAGNOSIS — Z79899 Other long term (current) drug therapy: Secondary | ICD-10-CM | POA: Insufficient documentation

## 2014-01-29 DIAGNOSIS — M129 Arthropathy, unspecified: Secondary | ICD-10-CM | POA: Insufficient documentation

## 2014-01-29 DIAGNOSIS — J438 Other emphysema: Secondary | ICD-10-CM | POA: Insufficient documentation

## 2014-01-29 DIAGNOSIS — Z9089 Acquired absence of other organs: Secondary | ICD-10-CM | POA: Insufficient documentation

## 2014-01-29 DIAGNOSIS — Z9889 Other specified postprocedural states: Secondary | ICD-10-CM | POA: Insufficient documentation

## 2014-01-29 DIAGNOSIS — Z8673 Personal history of transient ischemic attack (TIA), and cerebral infarction without residual deficits: Secondary | ICD-10-CM | POA: Insufficient documentation

## 2014-01-29 LAB — COMPREHENSIVE METABOLIC PANEL
ALT: 8 U/L (ref 0–53)
AST: 9 U/L (ref 0–37)
Albumin: 3.4 g/dL — ABNORMAL LOW (ref 3.5–5.2)
Alkaline Phosphatase: 64 U/L (ref 39–117)
BUN: 78 mg/dL — ABNORMAL HIGH (ref 6–23)
CO2: 18 mEq/L — ABNORMAL LOW (ref 19–32)
Calcium: 8.9 mg/dL (ref 8.4–10.5)
Chloride: 100 mEq/L (ref 96–112)
Creatinine, Ser: 8.54 mg/dL — ABNORMAL HIGH (ref 0.50–1.35)
GFR calc non Af Amer: 6 mL/min — ABNORMAL LOW (ref >90)
GFR, EST AFRICAN AMERICAN: 7 mL/min — AB (ref >90)
Glucose, Bld: 95 mg/dL (ref 70–99)
Potassium: 4.6 mEq/L (ref 3.7–5.3)
SODIUM: 138 meq/L (ref 137–147)
TOTAL PROTEIN: 7.8 g/dL (ref 6.0–8.3)
Total Bilirubin: 0.3 mg/dL (ref 0.3–1.2)

## 2014-01-29 LAB — CBC WITH DIFFERENTIAL/PLATELET
Basophils Absolute: 0 10*3/uL (ref 0.0–0.1)
Basophils Relative: 0 % (ref 0–1)
EOS ABS: 0.2 10*3/uL (ref 0.0–0.7)
EOS PCT: 2 % (ref 0–5)
HCT: 24.8 % — ABNORMAL LOW (ref 39.0–52.0)
Hemoglobin: 8.6 g/dL — ABNORMAL LOW (ref 13.0–17.0)
LYMPHS ABS: 1.7 10*3/uL (ref 0.7–4.0)
Lymphocytes Relative: 21 % (ref 12–46)
MCH: 29.4 pg (ref 26.0–34.0)
MCHC: 34.7 g/dL (ref 30.0–36.0)
MCV: 84.6 fL (ref 78.0–100.0)
Monocytes Absolute: 0.5 10*3/uL (ref 0.1–1.0)
Monocytes Relative: 6 % (ref 3–12)
Neutro Abs: 5.8 10*3/uL (ref 1.7–7.7)
Neutrophils Relative %: 71 % (ref 43–77)
PLATELETS: 203 10*3/uL (ref 150–400)
RBC: 2.93 MIL/uL — ABNORMAL LOW (ref 4.22–5.81)
RDW: 18.5 % — ABNORMAL HIGH (ref 11.5–15.5)
WBC: 8.3 10*3/uL (ref 4.0–10.5)

## 2014-01-29 LAB — LACTIC ACID, PLASMA: Lactic Acid, Venous: 0.5 mmol/L (ref 0.5–2.2)

## 2014-01-29 LAB — LIPASE, BLOOD: Lipase: 79 U/L — ABNORMAL HIGH (ref 11–59)

## 2014-01-29 MED ORDER — NICARDIPINE HCL IN NACL 20-0.86 MG/200ML-% IV SOLN
3.0000 mg/h | Freq: Once | INTRAVENOUS | Status: DC
  Filled 2014-01-29: qty 200

## 2014-01-29 MED ORDER — IOHEXOL 350 MG/ML SOLN
80.0000 mL | Freq: Once | INTRAVENOUS | Status: AC | PRN
  Administered 2014-01-29: 80 mL via INTRAVENOUS

## 2014-01-29 NOTE — Consult Note (Signed)
Full note to follow.  Patient has a history of open repair of a symptomatic AAA by Dr. Donnetta Hutching in 2007 with a 22x11 graft into right EIA and left CIA with re-implantation of the IMA into the left limb of the graft.  His aneurysm has significantly expanded and he has significant abdominal pain.  He is a poor candidate for open repair, and therefore, I have arranged transfer to [facility] for complex endovascular repair.  I discussed this with the patient and family.  He has stage 4 renal disease with a functioning access.  His CTA today with contrast will likely push him over the edge into ESRD needing dialysis.  His nephrologist is aware.     [name]

## 2014-01-29 NOTE — ED Notes (Signed)
Pt sent here from PCP for eval of 7.2 cm AAA; pt c/o mid abd pain; pt has new fistula to left arm but has not started dialysis yet; pt alert at present

## 2014-01-29 NOTE — ED Notes (Signed)
Hooked pt back up to monitor after returning from CT scan

## 2014-01-29 NOTE — ED Notes (Signed)
[facility] contacted, they will be here to retrieve pt by 1930

## 2014-01-29 NOTE — ED Notes (Signed)
Oxygen sat. 85%. Place on [place].

## 2014-01-29 NOTE — Consult Note (Signed)
Consult Note  Patient name: [name] MRN: [id number] DOB: Jan 31, 1951 Sex: male  Consulting Physician:  ER  Reason for Consult:  Chief Complaint  Patient presents with  . Abdominal Pain  . AAA    HISTORY OF PRESENT ILLNESS: This is a 63 yo male who presented to his PCP earlier today with abdominal pain.  He was sent for an abdominal ultrasound because of his history of AAA.  He has a history of open repair of a symptomatic AAA by Dr. Donnetta Hutching in 2007 with a 22x11 graft into right EIA and left CIA with re-implantation of the IMA into the left limb of the graft. He has a known descending thoracic aortic aneurysm followed by Dr. Prescott Gum.  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  The patient suffers from stage 4 renal disease secondary to diabetes and hypertension.  He is very near initiation of dialysis and has a right brachiocephalic fistula placed in 09/2013.  His creatinine today was 8.54 with a BUN of 78.  He does not endorse uremic symptoms.  He has COPD and is a former smoker.  His hypercholesterolemia is managed with a statin.  He suffers from diabetes and his most recent HbA1C was 6.4.  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   Past Medical History  Diagnosis Date  . Diabetes mellitus   . Hypertension   . Aneurysm of aorta   . Aneurysm of abdominal aorta   . Cataract, bilateral   . Emphysema of lung   . GERD (gastroesophageal reflux disease)   . Hyperlipidemia   . Thoracic aortic aneurysm current  . Stroke     CVA effected R eye  &R. leg.  had a few strokes  . Renal disorder     Dr Wilford Sports follows  . Neuropathy   . Arthritis     Past Surgical History  Procedure Laterality Date  . Abdominal aortic aneurysm repair  2006  . Hernia repair  2005    R inguinal  . Appendectomy  1954  . Av fistula placement Right 09/08/2013    Procedure:   CREATION OF A  BRACHIAL CEPHALIC ARTERIOVENOUS (AV) FISTULA  ;  Surgeon: Conrad [place], MD;  Location: [facility];  Service: Vascular;  Laterality: Right;    History   Social History  . Marital Status: Divorced    Spouse Name: [name]    Number of Children: [name]  . Years of Education: [name]   Occupational History  . Not on file.   Social History Main Topics  . Smoking status: Former Smoker -- 0.00 packs/day for 20 years    Quit date: 01/01/2013  . Smokeless tobacco: Never Used  . Alcohol Use: Yes     Comment: not since in hospital in January, used to have a beer occasionally  . Drug Use: No  . Sexual Activity: Not on file   Other Topics Concern  . Not on file   Social History Narrative  . No narrative on file    Family History  Problem Relation Age of Onset  . Cancer Mother   . Diabetes Mother   . Heart failure Father   . Stroke Father   . Hypertension Sister     Allergies as of 01/29/2014  . (No Known Allergies)    No current facility-administered medications on file prior  to encounter.   Current Outpatient Prescriptions on File Prior to Encounter  Medication Sig Dispense Refill  . allopurinol (ZYLOPRIM) 100 MG tablet Take 100 mg by mouth daily.        Marland Kitchen aspirin EC 81 MG tablet Take 81 mg by mouth every morning.        Marland Kitchen atorvastatin (LIPITOR) 40 MG tablet Take 40 mg by mouth every evening.       . calcitRIOL (ROCALTROL) 0.25 MCG capsule Take 0.25 mcg by mouth daily.      . cloNIDine (CATAPRES) 0.3 MG tablet Take 0.3 mg by mouth 3 (three) times daily.      . colchicine 0.6 MG tablet Take 0.6 mg by mouth daily. With sign of gout attack / doctors direction      . diltiazem (TIAZAC) 360 MG 24 hr capsule Take 360 mg by mouth daily.        . feeding supplement (GLUCERNA SHAKE) LIQD Take 237 mL by mouth daily.      . furosemide (LASIX) 40 MG tablet Take 40-80 mg by mouth 2 (two) times daily. Take 2 tablets in the morning and 1 tablet in the evening      . hydrALAZINE  (APRESOLINE) 25 MG tablet Take 25 mg by mouth 3 (three) times daily.       . insulin aspart (NOVOLOG) 100 UNIT/ML injection Inject 3 Units into the skin 3 (three) times daily with meals.      . insulin glargine (LANTUS) 100 UNIT/ML injection Inject 10 Units into the skin at bedtime. Please provide the patient Insulin Pens, Each pen containing 10 units of lantus to be given daily.      Marland Kitchen labetalol (NORMODYNE) 200 MG tablet Take 200 mg by mouth 2 (two) times daily.      . pantoprazole (PROTONIX) 20 MG tablet Take 20 mg by mouth every morning.       . Tamsulosin HCl (FLOMAX) 0.4 MG CAPS Take 0.4 mg by mouth daily after supper.       . vitamin B-12 (CYANOCOBALAMIN) 250 MCG tablet Take 250 mcg by mouth daily.         REVIEW OF SYSTEMS: Cardiovascular: No chest pain, chest pressure, palpitations, orthopnea, or dyspnea on exertion. No claudication or rest pain,  No history of DVT or phlebitis. Pulmonary: No productive cough, asthma or wheezing. Neurologic: No weakness, paresthesias, aphasia, or amaurosis. No dizziness. Hematologic: No bleeding problems or clotting disorders. Musculoskeletal: No joint pain or joint swelling. Gastrointestinal: positive for abdominal pain and nausea Genitourinary: No dysuria or hematuria. Psychiatric:: No history of major depression. Integumentary: No rashes or ulcers. Constitutional: No fever or chills.  PHYSICAL EXAMINATION: General: The patient appears their stated age.  Vital signs are BP 158/77  Pulse 63  Temp(Src) 97.6 F (36.4 C) (Oral)  Resp 22  SpO2 94% Pulmonary: Respirations are non-labored HEENT:  No gross abnormalities Abdomen: Soft.  Aorta is easily palpable and very tender Musculoskeletal: There are no major deformities.   Neurologic: No focal weakness or paresthesias are detected, Skin: There are no ulcer or rashes noted. Psychiatric: The patient has normal affect. Cardiovascular: There is a regular rate and rhythm without significant murmur  appreciated.  Palpable femoral pulses. Thrill in RUE AVF  Diagnostic Studies: I reviewed his CTA with radiology.  The following are the findings: 1. CTA findings are concerning for impending rupture of relatively  rapidly enlarging 8.7 x 8.4 cm juxtarenal abdominal aortic aneurysm  (previously measured at 5.1 x  5.1 cm on 12/23/2011). The aneurysm  sac its maximal diameter just proximal to the aorto bi-iliac bypass  anastomosis is saccular, irregular and lobulated. No definite  retroperitoneal hemorrhage or fluid to suggest active rupture.  Superiorly, the fusiform aneurysmal segment extends to above the  renal arteries.  2. 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 This penetrating  aortic ulcer versus saccular aneurysm measures 2.7 x 3.9 by 4.5 cm.    Assessment:  Symptomatic aortic aneurysm Plan: I discussed the importance of getting a CT scan with contrast to better define his anatomy.  I told him and his family that the contrast will likely cause him to start dialysis, however if his aneurysm is not better defined he would likely die from rupture.  This was also discussed with his nephrologist who agreed with getting the CT scan.  After reviewing the CT scan, his aneurysm has not ruptured, however with his clinical picture, it is the cause of his abdominal pain.  He is not a candidate for open surgical repair because of his underlying co-morbidities.  I feel that he will be better treated at [facility] where they have access to off the shelf or hand made devices that will enable him to have a more minimally invasive repair.  I discussed this with Dr. Sammuel Hines who has agree kindly to accept the patient in transfer.     Eldridge Abrahams, M.D. [facility] Office: [phone] Pager:  [phone]

## 2014-01-29 NOTE — ED Provider Notes (Signed)
CSN: [id number]     Arrival date & time 01/29/14  1333 History   First MD Initiated Contact with Patient 01/29/14 1348     Chief Complaint  Patient presents with  . Abdominal Pain  . AAA     (Consider location/radiation/quality/duration/timing/severity/associated sxs/prior Treatment) Patient is a 63 y.o. male presenting with abdominal pain.  Abdominal Pain Pain location:  Periumbilical Pain quality: aching   Pain radiates to:  Does not radiate Pain severity:  Moderate Onset quality:  Gradual Duration:  2 days Timing:  Constant Progression:  Worsening Chronicity:  New Context comment:  Hx of AAA repair several years ago.  Seen by PCP who found enlarging AAA on [place], transferred to ED. Relieved by:  Nothing Worsened by:  Nothing tried Associated symptoms: nausea   Associated symptoms: no chest pain, no cough, no diarrhea, no dysuria, no fever, no shortness of breath and no vomiting     Past Medical History  Diagnosis Date  . Diabetes mellitus   . Hypertension   . Aneurysm of aorta   . Aneurysm of abdominal aorta   . Cataract, bilateral   . Emphysema of lung   . GERD (gastroesophageal reflux disease)   . Hyperlipidemia   . Thoracic aortic aneurysm current  . Stroke     CVA effected R eye  &R. leg.  had a few strokes  . Renal disorder     Dr Wilford Sports follows  . Neuropathy   . Arthritis    Past Surgical History  Procedure Laterality Date  . Abdominal aortic aneurysm repair  2006  . Hernia repair  2005    R inguinal  . Appendectomy  1954  . Av fistula placement Right 09/08/2013    Procedure:  CREATION OF A  BRACHIAL CEPHALIC ARTERIOVENOUS (AV) FISTULA  ;  Surgeon: Conrad [place], MD;  Location: [facility];  Service: Vascular;  Laterality: Right;   Family History  Problem Relation Age of Onset  . Cancer Mother   . Diabetes Mother   . Heart failure Father   . Stroke Father   . Hypertension Sister    History  Substance Use Topics  . Smoking status: Former Smoker --  0.00 packs/day for 20 years    Quit date: 01/01/2013  . Smokeless tobacco: Never Used  . Alcohol Use: Yes     Comment: not since in hospital in January, used to have a beer occasionally    Review of Systems  Constitutional: Negative for fever.  Respiratory: Negative for cough and shortness of breath.   Cardiovascular: Negative for chest pain.  Gastrointestinal: Positive for nausea and abdominal pain. Negative for vomiting and diarrhea.  Genitourinary: Negative for dysuria.  All other systems reviewed and are negative.      Allergies  Review of patient's allergies indicates no known allergies.  Home Medications   Current Outpatient Rx  Name  Route  Sig  Dispense  Refill  . allopurinol (ZYLOPRIM) 100 MG tablet   Oral   Take 100 mg by mouth daily.           Marland Kitchen aspirin EC 81 MG tablet   Oral   Take 81 mg by mouth every morning.           Marland Kitchen atorvastatin (LIPITOR) 40 MG tablet   Oral   Take 40 mg by mouth daily.         . calcitRIOL (ROCALTROL) 0.25 MCG capsule   Oral   Take 0.25 mcg by mouth daily.         Marland Kitchen  cloNIDine (CATAPRES) 0.3 MG tablet   Oral   Take 0.3 mg by mouth 3 (three) times daily.         . colchicine 0.6 MG tablet   Oral   Take 0.6 mg by mouth daily. With sign of gout attack / doctors direction         . diltiazem (TIAZAC) 360 MG 24 hr capsule   Oral   Take 360 mg by mouth daily.           . feeding supplement (GLUCERNA SHAKE) LIQD   Oral   Take 237 mL by mouth daily.         . furosemide (LASIX) 40 MG tablet   Oral   Take 40-80 mg by mouth 2 (two) times daily. Take 2 tablets in the morning and 1 tablet in the evening         . hydrALAZINE (APRESOLINE) 25 MG tablet   Oral   Take 25 mg by mouth 3 (three) times daily.          . insulin aspart (NOVOLOG) 100 UNIT/ML injection   Subcutaneous   Inject 3 Units into the skin 3 (three) times daily with meals.         . insulin glargine (LANTUS) 100 UNIT/ML injection    Subcutaneous   Inject 10 Units into the skin at bedtime. Please provide the patient Insulin Pens, Each pen containing 10 units of lantus to be given daily.         Marland Kitchen labetalol (NORMODYNE) 200 MG tablet   Oral   Take 200 mg by mouth 2 (two) times daily.         Marland Kitchen LANTUS SOLOSTAR 100 UNIT/ML SOPN               . oxyCODONE-acetaminophen (PERCOCET) 5-325 MG per tablet   Oral   Take 2 tablets by mouth every 4 (four) hours as needed for pain.   20 tablet   0   . pantoprazole (PROTONIX) 20 MG tablet   Oral   Take 20 mg by mouth daily.         . predniSONE (DELTASONE) 10 MG tablet               . RELION SHORT PEN NEEDLES 31G X 8 MM MISC               . Tamsulosin HCl (FLOMAX) 0.4 MG CAPS   Oral   Take 0.4 mg by mouth daily after supper.          . vitamin B-12 (CYANOCOBALAMIN) 250 MCG tablet   Oral   Take 250 mcg by mouth daily.          BP 157/73  Pulse 68  Temp(Src) 99 F (37.2 C) (Oral)  Resp 18  SpO2 95% Physical Exam  Nursing note and vitals reviewed. Constitutional: He is oriented to person, place, and time. He appears well-developed and well-nourished. No distress.  HENT:  Head: Normocephalic and atraumatic.  Mouth/Throat: Oropharynx is clear and moist.  Eyes: Conjunctivae are normal. Pupils are equal, round, and reactive to light. No scleral icterus.  Neck: Neck supple.  Cardiovascular: Normal rate, regular rhythm, normal heart sounds and intact distal pulses.   No murmur heard. Pulmonary/Chest: Effort normal and breath sounds normal. No stridor. No respiratory distress. He has no wheezes. He has no rales.  Abdominal: Soft. He exhibits no distension. There is tenderness in the periumbilical area. There is no rebound and  no guarding.  Musculoskeletal: Normal range of motion. He exhibits no edema.  Neurological: He is alert and oriented to person, place, and time.  Skin: Skin is warm and dry. No rash noted.  Psychiatric: He has a normal mood  and affect. His behavior is normal.    ED Course  CRITICAL CARE Performed by: Serita Grit DAVID III Authorized by: Serita Grit DAVID III Total critical care time: 40 minutes Critical care time was exclusive of separately billable procedures and treating other patients. Critical care was time spent personally by me on the following activities: development of treatment plan with patient or surrogate, discussions with consultants, evaluation of patient's response to treatment, examination of patient, obtaining history from patient or surrogate, ordering and performing treatments and interventions, ordering and review of laboratory studies, ordering and review of radiographic studies, pulse oximetry, re-evaluation of patient's condition and review of old charts.   (including critical care time) Labs Review Labs Reviewed  CBC WITH DIFFERENTIAL - Abnormal; Notable for the following:    RBC 2.93 (*)    Hemoglobin 8.6 (*)    HCT 24.8 (*)    RDW 18.5 (*)    All other components within normal limits  COMPREHENSIVE METABOLIC PANEL - Abnormal; Notable for the following:    CO2 18 (*)    BUN 78 (*)    Creatinine, Ser 8.54 (*)    Albumin 3.4 (*)    GFR calc non Af Amer 6 (*)    GFR calc Af Amer 7 (*)    All other components within normal limits  LIPASE, BLOOD - Abnormal; Notable for the following:    Lipase 79 (*)    All other components within normal limits  LACTIC ACID, PLASMA   Imaging Review Ct Abdomen Pelvis Wo Contrast  01/29/2014   CLINICAL DATA:  Mid abdominal pain. Comment known 7.2 cm abdominal aortic aneurysm. ;. Chronic renal failure.  EXAM: CT CHEST, ABDOMEN AND PELVIS WITHOUT CONTRAST  TECHNIQUE: Multidetector CT imaging of the chest, abdomen and pelvis was performed following the standard protocol without IV contrast.  COMPARISON:  None.  FINDINGS: CT CHEST FINDINGS  The cardiac chambers are enlarged. The aortic arch exhibits a stable focal area of dilation to approximately  4.3 cm with stable penetrating ulcer. There is no evidence of acute hemorrhage. No false lumen is demonstrated. Within the mediastinum no hematoma is demonstrated. There is no significant pleural effusion.  At lung window settings there are emphysematous changes bilaterally. A large bolus lesion occupies much of the left anterior hemi thorax superiorly. There is basilar atelectasis versus pneumonia on the right. The thoracic vertebral bodies are preserved in height. No acute rib abnormality is demonstrated.  CT ABDOMEN AND PELVIS FINDINGS  The patient has a known of abdominal aortic aneurysm involving the mid and distal portions. This is been previously measured at approximately 7.2 cm. However, there is apparent acute rupture. With disruption of the calcification in the anterior wall below the level of the correction just fat and extending below the level of the renal arteries. The maximal AP dimension of the fairly contained hematoma is 8.5 cm and maximal transverse dimension is approximately 8.7 cm. The longitudinal dimension. Is approximately 7.5 cm. There is mild dilation of the common iliac arteries to between 1.5 and 1.9 cm.  The liver, gallbladder, pancreas, spleen, nondistended stomach, and adrenal glands are normal in appearance. The right kidney is atrophic. The left kidney is normal in size. Neither kidney exhibits evidence of obstruction.  The urinary bladder is partially distended. There is sigmoid diverticulosis but there is no evidence of acute diverticulitis nor other acute abnormality of the colon. There is a small right inguinal hernia containing fat and a knuckle of small bowel. A left inguinal hernia contains only fat. There is a broadly neck shallow umbilical hernia.  The lumbar vertebral bodies are preserved in height. There is disc space narrowing at L3-4. The bony pelvis exhibits no acute abnormality.  IMPRESSION: 1. There is an acutely leaking mid and distal abdominal aortic aneurysm. The  aneurysm and the surrounding hematoma measure 8.5 cm AP x 8.7 cm transversely x 7.5 cm longitudinally. There is no free blood in the pelvis. 2. There is a small right inguinal hernia containing a loop of bowel as well as a moderate amount of mesenteric fat. A left inguinal hernia contains only fat. There is a shallow umbilical hernia. 3. No acute hepatobiliary abnormality is demonstrated. There is right renal atrophy. The left kidney exhibits no acute abnormality. 4. There is focal dilation of a portion of the mid aortic arch to 4.3 cm. There is stable short segment penetrating ulceration. There is no evidence of a mediastinal hematoma. There is enlargement of the cardiac chambers. There is no pleural nor pericardial effusion. 5. There is atelectasis posteriorly in the right lower lobe and to a lesser extent in the left lower lobe which is more conspicuous today than on November 11, 2013. There are bullous emphysematous changes in both lungs. 6. These results were called by telephone at the time of interpretation on 01/29/2014 at 3:08 PM to Dr. Serita Grit , who verbally acknowledged these results.   Electronically Signed   By: David  [place]   On: 01/29/2014 15:08   Ct Chest Wo Contrast  01/29/2014   CLINICAL DATA:  Mid abdominal pain. Comment known 7.2 cm abdominal aortic aneurysm. ;. Chronic renal failure.  EXAM: CT CHEST, ABDOMEN AND PELVIS WITHOUT CONTRAST  TECHNIQUE: Multidetector CT imaging of the chest, abdomen and pelvis was performed following the standard protocol without IV contrast.  COMPARISON:  None.  FINDINGS: CT CHEST FINDINGS  The cardiac chambers are enlarged. The aortic arch exhibits a stable focal area of dilation to approximately 4.3 cm with stable penetrating ulcer. There is no evidence of acute hemorrhage. No false lumen is demonstrated. Within the mediastinum no hematoma is demonstrated. There is no significant pleural effusion.  At lung window settings there are emphysematous changes  bilaterally. A large bolus lesion occupies much of the left anterior hemi thorax superiorly. There is basilar atelectasis versus pneumonia on the right. The thoracic vertebral bodies are preserved in height. No acute rib abnormality is demonstrated.  CT ABDOMEN AND PELVIS FINDINGS  The patient has a known of abdominal aortic aneurysm involving the mid and distal portions. This is been previously measured at approximately 7.2 cm. However, there is apparent acute rupture. With disruption of the calcification in the anterior wall below the level of the correction just fat and extending below the level of the renal arteries. The maximal AP dimension of the fairly contained hematoma is 8.5 cm and maximal transverse dimension is approximately 8.7 cm. The longitudinal dimension. Is approximately 7.5 cm. There is mild dilation of the common iliac arteries to between 1.5 and 1.9 cm.  The liver, gallbladder, pancreas, spleen, nondistended stomach, and adrenal glands are normal in appearance. The right kidney is atrophic. The left kidney is normal in size. Neither kidney exhibits evidence of obstruction. The urinary bladder  is partially distended. There is sigmoid diverticulosis but there is no evidence of acute diverticulitis nor other acute abnormality of the colon. There is a small right inguinal hernia containing fat and a knuckle of small bowel. A left inguinal hernia contains only fat. There is a broadly neck shallow umbilical hernia.  The lumbar vertebral bodies are preserved in height. There is disc space narrowing at L3-4. The bony pelvis exhibits no acute abnormality.  IMPRESSION: 1. There is an acutely leaking mid and distal abdominal aortic aneurysm. The aneurysm and the surrounding hematoma measure 8.5 cm AP x 8.7 cm transversely x 7.5 cm longitudinally. There is no free blood in the pelvis. 2. There is a small right inguinal hernia containing a loop of bowel as well as a moderate amount of mesenteric fat. A left  inguinal hernia contains only fat. There is a shallow umbilical hernia. 3. No acute hepatobiliary abnormality is demonstrated. There is right renal atrophy. The left kidney exhibits no acute abnormality. 4. There is focal dilation of a portion of the mid aortic arch to 4.3 cm. There is stable short segment penetrating ulceration. There is no evidence of a mediastinal hematoma. There is enlargement of the cardiac chambers. There is no pleural nor pericardial effusion. 5. There is atelectasis posteriorly in the right lower lobe and to a lesser extent in the left lower lobe which is more conspicuous today than on November 11, 2013. There are bullous emphysematous changes in both lungs. 6. These results were called by telephone at the time of interpretation on 01/29/2014 at 3:08 PM to Dr. Serita Grit , who verbally acknowledged these results.   Electronically Signed   By: David  [place]   On: 01/29/2014 15:08  All radiology studies independently viewed by me.     EKG Interpretation  None  MDM   Final diagnoses:  AAA (abdominal aortic aneurysm)    63 yo male with hx of AAA repair presenting with worsening abdominal pain over two days and an enlarging AAA based on [place] in the clinic today.  He also has chronic renal failure, about to start dialysis.  Non con scan was concerning for AAA rupture, so it was felt that he needed a contrasted scan despite his poor kidney function as risk of imminent death outweighed risk of worsening renal function.    CT scan showed concern for impending rupture of his AAA.  Dr. Trula Slade (Vascular Surgery) has coordinated transfer from here to [facility] for emergent surgical repair of his AAA.      Houston Siren III, MD 01/29/14 [phone]

## 2014-01-29 NOTE — ED Notes (Signed)
Vascular MD at bedside.

## 2014-01-29 NOTE — ED Notes (Signed)
Josh from transport team at [facility] states they are now ready for pt, they state they were unaware of the slow leak to his AAA, Merrily Pew reports they are ready for pt and he will go to the OR and then to room

## 2014-02-02 ENCOUNTER — Other Ambulatory Visit: Payer: Self-pay | Admitting: *Deleted

## 2014-02-02 DIAGNOSIS — I719 Aortic aneurysm of unspecified site, without rupture: Secondary | ICD-10-CM

## 2014-02-24 ENCOUNTER — Ambulatory Visit: Payer: Medicare Other | Admitting: Cardiothoracic Surgery

## 2014-02-24 ENCOUNTER — Other Ambulatory Visit: Payer: Medicare Other

## 2014-03-03 DEATH — deceased

## 2014-04-01 ENCOUNTER — Ambulatory Visit: Payer: Medicare Other | Admitting: Podiatrist
